# Patient Record
Sex: Male | Born: 1935 | Race: Black or African American | Hispanic: No | Marital: Married | Smoking: Former smoker
Health system: Southern US, Community
[De-identification: ages and names within clinical notes are randomized; demographics above are authoritative.]

## PROBLEM LIST (undated history)

## (undated) DIAGNOSIS — Z8739 Personal history of other diseases of the musculoskeletal system and connective tissue: Secondary | ICD-10-CM

## (undated) DIAGNOSIS — K5900 Constipation, unspecified: Secondary | ICD-10-CM

## (undated) DIAGNOSIS — E785 Hyperlipidemia, unspecified: Secondary | ICD-10-CM

## (undated) DIAGNOSIS — F419 Anxiety disorder, unspecified: Secondary | ICD-10-CM

## (undated) DIAGNOSIS — M1990 Unspecified osteoarthritis, unspecified site: Secondary | ICD-10-CM

## (undated) DIAGNOSIS — C61 Malignant neoplasm of prostate: Secondary | ICD-10-CM

## (undated) DIAGNOSIS — E119 Type 2 diabetes mellitus without complications: Secondary | ICD-10-CM

## (undated) DIAGNOSIS — I10 Essential (primary) hypertension: Secondary | ICD-10-CM

## (undated) DIAGNOSIS — K219 Gastro-esophageal reflux disease without esophagitis: Secondary | ICD-10-CM

## (undated) DIAGNOSIS — R413 Other amnesia: Principal | ICD-10-CM

## (undated) DIAGNOSIS — E0590 Thyrotoxicosis, unspecified without thyrotoxic crisis or storm: Secondary | ICD-10-CM

## (undated) DIAGNOSIS — R011 Cardiac murmur, unspecified: Secondary | ICD-10-CM

## (undated) DIAGNOSIS — I2510 Atherosclerotic heart disease of native coronary artery without angina pectoris: Secondary | ICD-10-CM

## (undated) DIAGNOSIS — I499 Cardiac arrhythmia, unspecified: Secondary | ICD-10-CM

## (undated) HISTORY — DX: Anxiety disorder, unspecified: F41.9

## (undated) HISTORY — PX: PROSTATECTOMY: SHX69

## (undated) HISTORY — PX: COLONOSCOPY: SHX174

## (undated) HISTORY — PX: CATARACT EXTRACTION W/ INTRAOCULAR LENS IMPLANT: SHX1309

## (undated) HISTORY — PX: PENILE PROSTHESIS IMPLANT: SHX240

## (undated) HISTORY — PX: COLON SURGERY: SHX602

## (undated) HISTORY — DX: Hyperlipidemia, unspecified: E78.5

## (undated) HISTORY — DX: Essential (primary) hypertension: I10

## (undated) HISTORY — DX: Atherosclerotic heart disease of native coronary artery without angina pectoris: I25.10

## (undated) HISTORY — DX: Other amnesia: R41.3

## (undated) HISTORY — PX: APPENDECTOMY: SHX54

## (undated) HISTORY — DX: Thyrotoxicosis, unspecified without thyrotoxic crisis or storm: E05.90

---

## 1997-07-11 ENCOUNTER — Ambulatory Visit (HOSPITAL_COMMUNITY): Admission: RE | Admit: 1997-07-11 | Discharge: 1997-07-11 | Payer: Self-pay | Admitting: Nephrology

## 1997-08-19 ENCOUNTER — Ambulatory Visit (HOSPITAL_COMMUNITY): Admission: RE | Admit: 1997-08-19 | Discharge: 1997-08-19 | Payer: Self-pay | Admitting: Nephrology

## 1998-04-01 ENCOUNTER — Ambulatory Visit (HOSPITAL_COMMUNITY): Admission: RE | Admit: 1998-04-01 | Discharge: 1998-04-01 | Payer: Self-pay | Admitting: Nephrology

## 1998-04-01 ENCOUNTER — Encounter: Payer: Self-pay | Admitting: Nephrology

## 1998-05-20 ENCOUNTER — Encounter: Payer: Self-pay | Admitting: Urology

## 1998-05-22 ENCOUNTER — Observation Stay (HOSPITAL_COMMUNITY): Admission: RE | Admit: 1998-05-22 | Discharge: 1998-05-23 | Payer: Self-pay | Admitting: Urology

## 2000-12-05 ENCOUNTER — Ambulatory Visit (HOSPITAL_COMMUNITY): Admission: RE | Admit: 2000-12-05 | Discharge: 2000-12-05 | Payer: Self-pay | Admitting: *Deleted

## 2000-12-12 ENCOUNTER — Emergency Department (HOSPITAL_COMMUNITY): Admission: EM | Admit: 2000-12-12 | Discharge: 2000-12-12 | Payer: Self-pay | Admitting: Emergency Medicine

## 2001-12-18 ENCOUNTER — Encounter: Admission: RE | Admit: 2001-12-18 | Discharge: 2001-12-18 | Payer: Self-pay | Admitting: Nephrology

## 2001-12-18 ENCOUNTER — Encounter: Payer: Self-pay | Admitting: Nephrology

## 2003-08-23 ENCOUNTER — Inpatient Hospital Stay (HOSPITAL_COMMUNITY): Admission: EM | Admit: 2003-08-23 | Discharge: 2003-08-25 | Payer: Self-pay | Admitting: Emergency Medicine

## 2003-08-25 HISTORY — PX: CARDIAC CATHETERIZATION: SHX172

## 2003-11-07 ENCOUNTER — Encounter: Admission: RE | Admit: 2003-11-07 | Discharge: 2003-11-07 | Payer: Self-pay | Admitting: Nephrology

## 2005-11-24 ENCOUNTER — Encounter: Admission: RE | Admit: 2005-11-24 | Discharge: 2005-11-24 | Payer: Self-pay | Admitting: Nephrology

## 2006-04-12 ENCOUNTER — Ambulatory Visit (HOSPITAL_COMMUNITY): Admission: RE | Admit: 2006-04-12 | Discharge: 2006-04-12 | Payer: Self-pay | Admitting: Nephrology

## 2009-06-18 ENCOUNTER — Encounter (HOSPITAL_COMMUNITY): Admission: RE | Admit: 2009-06-18 | Discharge: 2009-09-16 | Payer: Self-pay | Admitting: Urology

## 2010-02-24 ENCOUNTER — Ambulatory Visit: Payer: Self-pay | Admitting: Oncology

## 2010-03-09 ENCOUNTER — Ambulatory Visit (HOSPITAL_COMMUNITY)
Admission: RE | Admit: 2010-03-09 | Discharge: 2010-03-09 | Payer: Self-pay | Source: Home / Self Care | Admitting: Urology

## 2010-04-16 ENCOUNTER — Ambulatory Visit: Payer: Self-pay | Admitting: Oncology

## 2010-04-20 LAB — CBC WITH DIFFERENTIAL/PLATELET
BASO%: 0.5 % (ref 0.0–2.0)
Basophils Absolute: 0 10*3/uL (ref 0.0–0.1)
EOS%: 6.4 % (ref 0.0–7.0)
Eosinophils Absolute: 0.3 10*3/uL (ref 0.0–0.5)
HCT: 36.4 % — ABNORMAL LOW (ref 38.4–49.9)
HGB: 11.8 g/dL — ABNORMAL LOW (ref 13.0–17.1)
LYMPH%: 33.3 % (ref 14.0–49.0)
MCH: 29.8 pg (ref 27.2–33.4)
MCHC: 32.4 g/dL (ref 32.0–36.0)
MCV: 91.8 fL (ref 79.3–98.0)
MONO#: 0.8 10*3/uL (ref 0.1–0.9)
MONO%: 15.9 % — ABNORMAL HIGH (ref 0.0–14.0)
NEUT#: 2.2 10*3/uL (ref 1.5–6.5)
NEUT%: 43.9 % (ref 39.0–75.0)
Platelets: 139 10*3/uL — ABNORMAL LOW (ref 140–400)
RBC: 3.97 10*6/uL — ABNORMAL LOW (ref 4.20–5.82)
RDW: 14.4 % (ref 11.0–14.6)
WBC: 5 10*3/uL (ref 4.0–10.3)
lymph#: 1.7 10*3/uL (ref 0.9–3.3)

## 2010-04-20 LAB — COMPREHENSIVE METABOLIC PANEL
ALT: 14 U/L (ref 0–53)
AST: 22 U/L (ref 0–37)
Albumin: 4.3 g/dL (ref 3.5–5.2)
Alkaline Phosphatase: 40 U/L (ref 39–117)
BUN: 20 mg/dL (ref 6–23)
CO2: 26 mEq/L (ref 19–32)
Calcium: 9.8 mg/dL (ref 8.4–10.5)
Chloride: 105 mEq/L (ref 96–112)
Creatinine, Ser: 1.97 mg/dL — ABNORMAL HIGH (ref 0.40–1.50)
Glucose, Bld: 66 mg/dL — ABNORMAL LOW (ref 70–99)
Potassium: 3.9 mEq/L (ref 3.5–5.3)
Sodium: 142 mEq/L (ref 135–145)
Total Bilirubin: 0.7 mg/dL (ref 0.3–1.2)
Total Protein: 7.1 g/dL (ref 6.0–8.3)

## 2010-04-20 LAB — TESTOSTERONE: Testosterone: 15.41 ng/dL — ABNORMAL LOW (ref 250–890)

## 2010-04-20 LAB — PSA: PSA: 23.81 ng/mL — ABNORMAL HIGH (ref <=4.00)

## 2010-07-06 ENCOUNTER — Other Ambulatory Visit: Payer: Self-pay | Admitting: Oncology

## 2010-07-06 ENCOUNTER — Encounter: Payer: Medicare Other | Admitting: Oncology

## 2010-07-06 DIAGNOSIS — E785 Hyperlipidemia, unspecified: Secondary | ICD-10-CM

## 2010-07-06 DIAGNOSIS — I10 Essential (primary) hypertension: Secondary | ICD-10-CM

## 2010-07-06 DIAGNOSIS — E119 Type 2 diabetes mellitus without complications: Secondary | ICD-10-CM

## 2010-07-06 DIAGNOSIS — C61 Malignant neoplasm of prostate: Secondary | ICD-10-CM

## 2010-07-06 LAB — COMPREHENSIVE METABOLIC PANEL
AST: 19 U/L (ref 0–37)
Albumin: 4.3 g/dL (ref 3.5–5.2)
Alkaline Phosphatase: 47 U/L (ref 39–117)
Calcium: 9.4 mg/dL (ref 8.4–10.5)
Chloride: 101 mEq/L (ref 96–112)
Potassium: 3.7 mEq/L (ref 3.5–5.3)
Sodium: 141 mEq/L (ref 135–145)
Total Protein: 7.7 g/dL (ref 6.0–8.3)

## 2010-07-06 LAB — CBC WITH DIFFERENTIAL/PLATELET
BASO%: 0.1 % (ref 0.0–2.0)
EOS%: 3.1 % (ref 0.0–7.0)
Eosinophils Absolute: 0.2 10*3/uL (ref 0.0–0.5)
LYMPH%: 51.3 % — ABNORMAL HIGH (ref 14.0–49.0)
MCH: 28.9 pg (ref 27.2–33.4)
MCHC: 33.2 g/dL (ref 32.0–36.0)
MCV: 87 fL (ref 79.3–98.0)
MONO%: 8.2 % (ref 0.0–14.0)
Platelets: 147 10*3/uL (ref 140–400)
RBC: 4.46 10*6/uL (ref 4.20–5.82)
RDW: 13.6 % (ref 11.0–14.6)

## 2010-07-08 ENCOUNTER — Encounter (HOSPITAL_BASED_OUTPATIENT_CLINIC_OR_DEPARTMENT_OTHER): Payer: Medicare Other | Admitting: Oncology

## 2010-07-08 DIAGNOSIS — Z5111 Encounter for antineoplastic chemotherapy: Secondary | ICD-10-CM

## 2010-07-08 DIAGNOSIS — C61 Malignant neoplasm of prostate: Secondary | ICD-10-CM

## 2010-08-27 NOTE — Cardiovascular Report (Signed)
NAME:  Shane [name], Shane [name] NO.:  [id number]   MEDICAL RECORD NO.:  [id number]                   PATIENT TYPE:  OUT   LOCATION:  CATH                                 FACILITY:  [facility]   PHYSICIAN:  Ricki Rodriguez, M.D.               DATE OF BIRTH:  Sep 23, 1926   DATE OF PROCEDURE:  08/25/2003  DATE OF DISCHARGE:                              CARDIAC CATHETERIZATION   PROCEDURE:  1. Left heart catheterization.  2. Selective coronary angiography.  3. Left ventricular function study.   APPROACH:  Right femoral artery using 5 French sheath.   COMPLICATIONS:  None.   Less than 50 mL of dye was used.   INDICATION:  A 78 year old black male with chest pain, abnormal cardiac  enzymes, abnormal EKG and history of hypertension and hyperlipidemia.   HEMODYNAMIC DATA:  The left ventricular pressure was 164/13 and aortic  pressure was 180/85.  One squirt of sublingual nitroglycerin was given for  blood pressure control.   LEFT VENTRICULOGRAM:  The left ventriculogram showed hyperdynamic wall  motion with ejection fraction of 70-75%.   CORONARY ANATOMY:  The left main coronary artery was unremarkable.   Left anterior descending coronary artery:  The left anterior descending  coronary artery was also unremarkable and its diagonal two branch was larger  than the diagonal one.   Left circumflex coronary artery:  The left circumflex coronary artery was  also essentially unremarkable.  Its obtuse marginal branch was normal and  obtuse marginal branch one and two were very small vessels and obtuse  marginal branch three was normal vessel.   Right coronary artery:  The right coronary artery was dominant and  unremarkable including the posterior descending coronary arteries  and the  posterolateral branch.   IMPRESSION:  1. Normal coronaries.  2. Hypertensive heart disease with normal left ventricular systolic     function.   RECOMMENDATIONS:  This patient will  be treated medically and Dr. Jeri Cos was notified.                                               Ricki Rodriguez, M.D.    ASK/MEDQ  D:  08/25/2003  T:  08/25/2003  Job:  [id number]

## 2010-08-31 ENCOUNTER — Other Ambulatory Visit: Payer: Self-pay | Admitting: Oncology

## 2010-08-31 ENCOUNTER — Encounter: Payer: Medicare Other | Admitting: Oncology

## 2010-08-31 LAB — COMPREHENSIVE METABOLIC PANEL
ALT: 11 U/L (ref 0–53)
Alkaline Phosphatase: 45 U/L (ref 39–117)
CO2: 24 mEq/L (ref 19–32)
Potassium: 3.8 mEq/L (ref 3.5–5.3)
Sodium: 140 mEq/L (ref 135–145)
Total Bilirubin: 0.7 mg/dL (ref 0.3–1.2)
Total Protein: 6.5 g/dL (ref 6.0–8.3)

## 2010-08-31 LAB — CBC WITH DIFFERENTIAL/PLATELET
BASO%: 0.5 % (ref 0.0–2.0)
LYMPH%: 36 % (ref 14.0–49.0)
MCHC: 32.8 g/dL (ref 32.0–36.0)
MONO#: 0.5 10*3/uL (ref 0.1–0.9)
Platelets: 122 10*3/uL — ABNORMAL LOW (ref 140–400)
RBC: 4.02 10*6/uL — ABNORMAL LOW (ref 4.20–5.82)
RDW: 14.3 % (ref 11.0–14.6)
WBC: 3.7 10*3/uL — ABNORMAL LOW (ref 4.0–10.3)

## 2010-08-31 LAB — PSA: PSA: 47.49 ng/mL — ABNORMAL HIGH (ref <=4.00)

## 2010-09-07 ENCOUNTER — Other Ambulatory Visit: Payer: Self-pay | Admitting: Oncology

## 2010-09-07 ENCOUNTER — Encounter (HOSPITAL_BASED_OUTPATIENT_CLINIC_OR_DEPARTMENT_OTHER): Payer: Medicare Other | Admitting: Oncology

## 2010-09-07 DIAGNOSIS — C61 Malignant neoplasm of prostate: Secondary | ICD-10-CM

## 2010-09-22 ENCOUNTER — Encounter (HOSPITAL_COMMUNITY): Payer: Self-pay

## 2010-09-22 ENCOUNTER — Encounter (HOSPITAL_COMMUNITY)
Admission: RE | Admit: 2010-09-22 | Discharge: 2010-09-22 | Disposition: A | Discharge status: Home / Self Care | Payer: Medicare Other | Source: Ambulatory Visit | Attending: Oncology | Admitting: Oncology

## 2010-09-22 ENCOUNTER — Ambulatory Visit (HOSPITAL_COMMUNITY): Payer: Medicare Other

## 2010-09-22 DIAGNOSIS — N200 Calculus of kidney: Secondary | ICD-10-CM | POA: Insufficient documentation

## 2010-09-22 DIAGNOSIS — R972 Elevated prostate specific antigen [PSA]: Secondary | ICD-10-CM | POA: Insufficient documentation

## 2010-09-22 DIAGNOSIS — Z9079 Acquired absence of other genital organ(s): Secondary | ICD-10-CM | POA: Insufficient documentation

## 2010-09-22 DIAGNOSIS — K4090 Unilateral inguinal hernia, without obstruction or gangrene, not specified as recurrent: Secondary | ICD-10-CM | POA: Insufficient documentation

## 2010-09-22 DIAGNOSIS — E279 Disorder of adrenal gland, unspecified: Secondary | ICD-10-CM | POA: Insufficient documentation

## 2010-09-22 DIAGNOSIS — C61 Malignant neoplasm of prostate: Secondary | ICD-10-CM | POA: Insufficient documentation

## 2010-09-22 DIAGNOSIS — I700 Atherosclerosis of aorta: Secondary | ICD-10-CM | POA: Insufficient documentation

## 2010-09-22 DIAGNOSIS — R599 Enlarged lymph nodes, unspecified: Secondary | ICD-10-CM | POA: Insufficient documentation

## 2010-09-22 DIAGNOSIS — I359 Nonrheumatic aortic valve disorder, unspecified: Secondary | ICD-10-CM | POA: Insufficient documentation

## 2010-09-22 HISTORY — DX: Malignant neoplasm of prostate: C61

## 2010-09-22 MED ORDER — IOHEXOL 300 MG/ML  SOLN
100.0000 mL | Freq: Once | INTRAMUSCULAR | Status: AC | PRN
  Administered 2010-09-22: 100 mL via INTRAVENOUS

## 2010-09-22 MED ORDER — TECHNETIUM TC 99M MEDRONATE IV KIT
23.9000 | PACK | Freq: Once | INTRAVENOUS | Status: AC | PRN
  Administered 2010-09-22: 23.9 via INTRAVENOUS

## 2010-09-28 ENCOUNTER — Encounter (HOSPITAL_BASED_OUTPATIENT_CLINIC_OR_DEPARTMENT_OTHER): Payer: Medicare Other | Admitting: Oncology

## 2010-09-28 DIAGNOSIS — C61 Malignant neoplasm of prostate: Secondary | ICD-10-CM

## 2010-10-28 ENCOUNTER — Other Ambulatory Visit: Payer: Self-pay | Admitting: Oncology

## 2010-10-28 ENCOUNTER — Encounter (HOSPITAL_BASED_OUTPATIENT_CLINIC_OR_DEPARTMENT_OTHER): Payer: Medicare Other | Admitting: Oncology

## 2010-10-28 DIAGNOSIS — C61 Malignant neoplasm of prostate: Secondary | ICD-10-CM

## 2010-10-28 LAB — CBC WITH DIFFERENTIAL/PLATELET
Basophils Absolute: 0 10*3/uL (ref 0.0–0.1)
EOS%: 4.9 % (ref 0.0–7.0)
HCT: 39 % (ref 38.4–49.9)
HGB: 12.8 g/dL — ABNORMAL LOW (ref 13.0–17.1)
MCH: 29.6 pg (ref 27.2–33.4)
MCV: 90.1 fL (ref 79.3–98.0)
MONO%: 11.5 % (ref 0.0–14.0)
NEUT%: 42.1 % (ref 39.0–75.0)

## 2010-10-28 LAB — COMPREHENSIVE METABOLIC PANEL
AST: 17 U/L (ref 0–37)
Alkaline Phosphatase: 44 U/L (ref 39–117)
BUN: 20 mg/dL (ref 6–23)
Creatinine, Ser: 1.52 mg/dL — ABNORMAL HIGH (ref 0.50–1.35)

## 2010-11-02 ENCOUNTER — Other Ambulatory Visit: Payer: Self-pay | Admitting: Oncology

## 2010-11-02 DIAGNOSIS — C61 Malignant neoplasm of prostate: Secondary | ICD-10-CM

## 2010-11-08 ENCOUNTER — Ambulatory Visit (HOSPITAL_COMMUNITY)
Admission: RE | Admit: 2010-11-08 | Discharge: 2010-11-08 | Disposition: A | Discharge status: Home / Self Care | Payer: Medicare Other | Source: Ambulatory Visit | Attending: Oncology | Admitting: Oncology

## 2010-11-08 ENCOUNTER — Other Ambulatory Visit: Payer: Self-pay | Admitting: Oncology

## 2010-11-08 DIAGNOSIS — C7952 Secondary malignant neoplasm of bone marrow: Secondary | ICD-10-CM | POA: Insufficient documentation

## 2010-11-08 DIAGNOSIS — C61 Malignant neoplasm of prostate: Secondary | ICD-10-CM

## 2010-11-08 DIAGNOSIS — C7951 Secondary malignant neoplasm of bone: Secondary | ICD-10-CM | POA: Insufficient documentation

## 2010-11-19 ENCOUNTER — Encounter (HOSPITAL_BASED_OUTPATIENT_CLINIC_OR_DEPARTMENT_OTHER): Payer: Medicare Other | Admitting: Oncology

## 2010-11-19 ENCOUNTER — Other Ambulatory Visit: Payer: Self-pay | Admitting: Oncology

## 2010-11-19 DIAGNOSIS — C7951 Secondary malignant neoplasm of bone: Secondary | ICD-10-CM

## 2010-11-19 DIAGNOSIS — C61 Malignant neoplasm of prostate: Secondary | ICD-10-CM

## 2010-11-19 DIAGNOSIS — Z5112 Encounter for antineoplastic immunotherapy: Secondary | ICD-10-CM

## 2010-11-19 LAB — CBC WITH DIFFERENTIAL/PLATELET
Basophils Absolute: 0 10*3/uL (ref 0.0–0.1)
EOS%: 7.5 % — ABNORMAL HIGH (ref 0.0–7.0)
HCT: 37.5 % — ABNORMAL LOW (ref 38.4–49.9)
HGB: 12.1 g/dL — ABNORMAL LOW (ref 13.0–17.1)
MCH: 28.6 pg (ref 27.2–33.4)
MCV: 88.7 fL (ref 79.3–98.0)
NEUT%: 41.3 % (ref 39.0–75.0)
lymph#: 1.8 10*3/uL (ref 0.9–3.3)

## 2010-11-19 LAB — COMPREHENSIVE METABOLIC PANEL
Albumin: 3.9 g/dL (ref 3.5–5.2)
BUN: 20 mg/dL (ref 6–23)
CO2: 24 mEq/L (ref 19–32)
Calcium: 9.2 mg/dL (ref 8.4–10.5)
Chloride: 104 mEq/L (ref 96–112)
Glucose, Bld: 98 mg/dL (ref 70–99)
Potassium: 3.6 mEq/L (ref 3.5–5.3)
Total Protein: 6.5 g/dL (ref 6.0–8.3)

## 2010-11-23 ENCOUNTER — Encounter (HOSPITAL_BASED_OUTPATIENT_CLINIC_OR_DEPARTMENT_OTHER): Payer: Medicare Other | Admitting: Oncology

## 2010-11-23 DIAGNOSIS — C61 Malignant neoplasm of prostate: Secondary | ICD-10-CM

## 2010-11-23 DIAGNOSIS — Z452 Encounter for adjustment and management of vascular access device: Secondary | ICD-10-CM

## 2010-11-25 ENCOUNTER — Encounter (HOSPITAL_BASED_OUTPATIENT_CLINIC_OR_DEPARTMENT_OTHER): Payer: Medicare Other | Admitting: Oncology

## 2010-11-25 DIAGNOSIS — C61 Malignant neoplasm of prostate: Secondary | ICD-10-CM

## 2010-11-25 DIAGNOSIS — Z452 Encounter for adjustment and management of vascular access device: Secondary | ICD-10-CM

## 2010-11-25 DIAGNOSIS — C7951 Secondary malignant neoplasm of bone: Secondary | ICD-10-CM

## 2010-12-03 ENCOUNTER — Other Ambulatory Visit: Payer: Self-pay | Admitting: Oncology

## 2010-12-03 ENCOUNTER — Encounter (HOSPITAL_BASED_OUTPATIENT_CLINIC_OR_DEPARTMENT_OTHER): Payer: Medicare Other | Admitting: Oncology

## 2010-12-03 DIAGNOSIS — Z5111 Encounter for antineoplastic chemotherapy: Secondary | ICD-10-CM

## 2010-12-03 DIAGNOSIS — C7951 Secondary malignant neoplasm of bone: Secondary | ICD-10-CM

## 2010-12-03 DIAGNOSIS — C61 Malignant neoplasm of prostate: Secondary | ICD-10-CM

## 2010-12-03 DIAGNOSIS — C7952 Secondary malignant neoplasm of bone marrow: Secondary | ICD-10-CM

## 2010-12-03 LAB — COMPREHENSIVE METABOLIC PANEL
ALT: 15 U/L (ref 0–53)
Albumin: 4.2 g/dL (ref 3.5–5.2)
Alkaline Phosphatase: 46 U/L (ref 39–117)
CO2: 26 mEq/L (ref 19–32)
Glucose, Bld: 144 mg/dL — ABNORMAL HIGH (ref 70–99)
Potassium: 4 mEq/L (ref 3.5–5.3)
Sodium: 140 mEq/L (ref 135–145)
Total Bilirubin: 0.7 mg/dL (ref 0.3–1.2)
Total Protein: 7.1 g/dL (ref 6.0–8.3)

## 2010-12-03 LAB — CBC WITH DIFFERENTIAL/PLATELET
Basophils Absolute: 0 10*3/uL (ref 0.0–0.1)
EOS%: 9.7 % — ABNORMAL HIGH (ref 0.0–7.0)
Eosinophils Absolute: 0.4 10*3/uL (ref 0.0–0.5)
HGB: 12.1 g/dL — ABNORMAL LOW (ref 13.0–17.1)
NEUT#: 1.6 10*3/uL (ref 1.5–6.5)
RBC: 4.2 10*6/uL (ref 4.20–5.82)
RDW: 13.9 % (ref 11.0–14.6)
lymph#: 1.3 10*3/uL (ref 0.9–3.3)
nRBC: 0 % (ref 0–0)

## 2010-12-07 ENCOUNTER — Encounter (HOSPITAL_BASED_OUTPATIENT_CLINIC_OR_DEPARTMENT_OTHER): Payer: Medicare Other | Admitting: Oncology

## 2010-12-07 DIAGNOSIS — C7952 Secondary malignant neoplasm of bone marrow: Secondary | ICD-10-CM

## 2010-12-07 DIAGNOSIS — C61 Malignant neoplasm of prostate: Secondary | ICD-10-CM

## 2010-12-07 DIAGNOSIS — C7951 Secondary malignant neoplasm of bone: Secondary | ICD-10-CM

## 2010-12-07 DIAGNOSIS — Z452 Encounter for adjustment and management of vascular access device: Secondary | ICD-10-CM

## 2010-12-09 ENCOUNTER — Encounter (HOSPITAL_BASED_OUTPATIENT_CLINIC_OR_DEPARTMENT_OTHER): Payer: Medicare Other | Admitting: Oncology

## 2010-12-09 DIAGNOSIS — C61 Malignant neoplasm of prostate: Secondary | ICD-10-CM

## 2010-12-09 DIAGNOSIS — Z452 Encounter for adjustment and management of vascular access device: Secondary | ICD-10-CM

## 2010-12-17 ENCOUNTER — Encounter (HOSPITAL_BASED_OUTPATIENT_CLINIC_OR_DEPARTMENT_OTHER): Payer: Medicare Other | Admitting: Oncology

## 2010-12-17 ENCOUNTER — Other Ambulatory Visit: Payer: Self-pay | Admitting: Oncology

## 2010-12-17 DIAGNOSIS — C61 Malignant neoplasm of prostate: Secondary | ICD-10-CM

## 2010-12-17 DIAGNOSIS — C7952 Secondary malignant neoplasm of bone marrow: Secondary | ICD-10-CM

## 2010-12-17 LAB — CBC WITH DIFFERENTIAL/PLATELET
BASO%: 1 % (ref 0.0–2.0)
EOS%: 7.1 % — ABNORMAL HIGH (ref 0.0–7.0)
Eosinophils Absolute: 0.3 10*3/uL (ref 0.0–0.5)
LYMPH%: 34.3 % (ref 14.0–49.0)
MCHC: 32.9 g/dL (ref 32.0–36.0)
MCV: 86.4 fL (ref 79.3–98.0)
MONO%: 11 % (ref 0.0–14.0)
NEUT#: 2 10*3/uL (ref 1.5–6.5)
Platelets: 91 10*3/uL — ABNORMAL LOW (ref 140–400)
RBC: 4.05 10*6/uL — ABNORMAL LOW (ref 4.20–5.82)
RDW: 13.8 % (ref 11.0–14.6)
nRBC: 0 % (ref 0–0)

## 2010-12-17 LAB — COMPREHENSIVE METABOLIC PANEL
ALT: 14 U/L (ref 0–53)
AST: 19 U/L (ref 0–37)
Alkaline Phosphatase: 46 U/L (ref 39–117)
Creatinine, Ser: 1.32 mg/dL (ref 0.50–1.35)
Sodium: 141 mEq/L (ref 135–145)
Total Bilirubin: 0.6 mg/dL (ref 0.3–1.2)
Total Protein: 6.8 g/dL (ref 6.0–8.3)

## 2010-12-20 ENCOUNTER — Other Ambulatory Visit: Payer: Self-pay | Admitting: Oncology

## 2010-12-20 DIAGNOSIS — C61 Malignant neoplasm of prostate: Secondary | ICD-10-CM

## 2010-12-24 ENCOUNTER — Ambulatory Visit (HOSPITAL_COMMUNITY)
Admission: RE | Admit: 2010-12-24 | Discharge: 2010-12-24 | Disposition: A | Discharge status: Home / Self Care | Payer: Medicare Other | Source: Ambulatory Visit | Attending: Oncology | Admitting: Oncology

## 2010-12-24 DIAGNOSIS — Z452 Encounter for adjustment and management of vascular access device: Secondary | ICD-10-CM | POA: Insufficient documentation

## 2010-12-24 DIAGNOSIS — C61 Malignant neoplasm of prostate: Secondary | ICD-10-CM | POA: Insufficient documentation

## 2011-01-26 ENCOUNTER — Other Ambulatory Visit: Payer: Self-pay | Admitting: Oncology

## 2011-01-26 ENCOUNTER — Encounter (HOSPITAL_BASED_OUTPATIENT_CLINIC_OR_DEPARTMENT_OTHER): Payer: Medicare Other | Admitting: Oncology

## 2011-01-26 DIAGNOSIS — C61 Malignant neoplasm of prostate: Secondary | ICD-10-CM

## 2011-01-26 DIAGNOSIS — Z9079 Acquired absence of other genital organ(s): Secondary | ICD-10-CM

## 2011-01-26 DIAGNOSIS — Z8546 Personal history of malignant neoplasm of prostate: Secondary | ICD-10-CM

## 2011-01-26 DIAGNOSIS — Z923 Personal history of irradiation: Secondary | ICD-10-CM

## 2011-01-26 LAB — COMPREHENSIVE METABOLIC PANEL
ALT: 10 U/L (ref 0–53)
AST: 16 U/L (ref 0–37)
Albumin: 3.5 g/dL (ref 3.5–5.2)
Alkaline Phosphatase: 51 U/L (ref 39–117)
BUN: 19 mg/dL (ref 6–23)
Calcium: 9.3 mg/dL (ref 8.4–10.5)
Chloride: 104 mEq/L (ref 96–112)
Potassium: 3.9 mEq/L (ref 3.5–5.3)
Sodium: 138 mEq/L (ref 135–145)

## 2011-01-26 LAB — CBC WITH DIFFERENTIAL/PLATELET
BASO%: 0.6 % (ref 0.0–2.0)
EOS%: 4.3 % (ref 0.0–7.0)
HGB: 11.4 g/dL — ABNORMAL LOW (ref 13.0–17.1)
MCH: 28.5 pg (ref 27.2–33.4)
MCHC: 32.9 g/dL (ref 32.0–36.0)
MCV: 86.8 fL (ref 79.3–98.0)
MONO%: 12.7 % (ref 0.0–14.0)
RBC: 4 10*6/uL — ABNORMAL LOW (ref 4.20–5.82)
RDW: 13.7 % (ref 11.0–14.6)
lymph#: 1.4 10*3/uL (ref 0.9–3.3)

## 2011-04-01 ENCOUNTER — Ambulatory Visit
Admission: RE | Admit: 2011-04-01 | Discharge: 2011-04-01 | Disposition: A | Discharge status: Home / Self Care | Payer: Medicare Other | Source: Ambulatory Visit | Attending: Nephrology | Admitting: Nephrology

## 2011-04-01 ENCOUNTER — Other Ambulatory Visit: Payer: Self-pay | Admitting: Nephrology

## 2011-04-01 DIAGNOSIS — I10 Essential (primary) hypertension: Secondary | ICD-10-CM

## 2011-04-10 ENCOUNTER — Telehealth: Payer: Self-pay | Admitting: Oncology

## 2011-04-10 NOTE — Telephone Encounter (Signed)
S/w the pt and he is aware of his feb 2013 appts

## 2011-05-09 ENCOUNTER — Encounter: Payer: Self-pay | Admitting: *Deleted

## 2011-05-27 ENCOUNTER — Encounter: Payer: Self-pay | Admitting: *Deleted

## 2011-06-01 ENCOUNTER — Ambulatory Visit (HOSPITAL_BASED_OUTPATIENT_CLINIC_OR_DEPARTMENT_OTHER): Payer: PRIVATE HEALTH INSURANCE | Admitting: Oncology

## 2011-06-01 ENCOUNTER — Telehealth: Payer: Self-pay | Admitting: Oncology

## 2011-06-01 ENCOUNTER — Other Ambulatory Visit: Payer: PRIVATE HEALTH INSURANCE | Admitting: Lab

## 2011-06-01 VITALS — BP 145/72 | HR 60 | Temp 97.2°F | Ht 66.5 in | Wt 172.8 lb

## 2011-06-01 DIAGNOSIS — C7951 Secondary malignant neoplasm of bone: Secondary | ICD-10-CM

## 2011-06-01 DIAGNOSIS — C61 Malignant neoplasm of prostate: Secondary | ICD-10-CM

## 2011-06-01 DIAGNOSIS — C7952 Secondary malignant neoplasm of bone marrow: Secondary | ICD-10-CM

## 2011-06-01 LAB — COMPREHENSIVE METABOLIC PANEL
ALT: 10 U/L (ref 0–53)
AST: 18 U/L (ref 0–37)
Albumin: 4.2 g/dL (ref 3.5–5.2)
CO2: 26 mEq/L (ref 19–32)
Calcium: 9.6 mg/dL (ref 8.4–10.5)
Chloride: 103 mEq/L (ref 96–112)
Creatinine, Ser: 1.49 mg/dL — ABNORMAL HIGH (ref 0.50–1.35)
Potassium: 3.9 mEq/L (ref 3.5–5.3)
Sodium: 138 mEq/L (ref 135–145)
Total Protein: 7 g/dL (ref 6.0–8.3)

## 2011-06-01 LAB — CBC WITH DIFFERENTIAL/PLATELET
BASO%: 0.5 % (ref 0.0–2.0)
EOS%: 4 % (ref 0.0–7.0)
LYMPH%: 36.1 % (ref 14.0–49.0)
MCHC: 32.3 g/dL (ref 32.0–36.0)
MCV: 90 fL (ref 79.3–98.0)
MONO%: 10.8 % (ref 0.0–14.0)
NEUT#: 1.8 10*3/uL (ref 1.5–6.5)
Platelets: 131 10*3/uL — ABNORMAL LOW (ref 140–400)
RBC: 4.51 10*6/uL (ref 4.20–5.82)
RDW: 14.3 % (ref 11.0–14.6)

## 2011-06-01 LAB — PSA: PSA: 166.5 ng/mL — ABNORMAL HIGH (ref <=4.00)

## 2011-06-01 NOTE — Progress Notes (Signed)
Hematology and Oncology Follow Up Visit  Shane [name] [id number] 1926/04/24 78 y.o. 06/01/2011 10:44 AM  CC: Shane [name], M.D.  Shane [name], M.D.    Principle Diagnosis:This is an 78 year old gentleman with prostate cancer diagnosed in 36.  He currently has metastatic disease that is asymptomatic.   Prior Therapy: 1. Status post prostatectomy and lymphadenectomy.  He had involvement of the seminal vesicles. 2. Patient treated with adjuvant radiation therapy. 3. Patient developed recurrent disease treated with Lupron and subsequently with Lupron and Casodex and Casodex withdrawal. 4. Patient treated with second-line hormonal manipulation, including ketoconazole, prednisone and subsequently had relapse with PSA up to 47. 5. Received Provenge immunotherapy.  He had treated with 3 Provenge infusions, the last of which was given on 12/17/2010.  H  Current therapy: Currently on hormonal deprivation 30 mg every 4 months-6 months.  Interim History:  Shane [name] presents today for a followup visit.  He has continued to be asymptomatic from his disease.  He is not reporting any symptoms of chest pain or difficulty breathing.  He had not reported any back pain.  Did not report any genitourinary complaints.  Performance status and activity level remain excellent at this time.  Fortunately, he has continued to be minimally symptomatic or asymptomatic from his cancer. No recent hospitalizations or illnesses.   Medications: I have reviewed the patient's current medications. Current outpatient prescriptions:amLODipine (NORVASC) 5 MG tablet, Take 5 mg by mouth daily., Disp: , Rfl: ;  aspirin 81 MG tablet, Take 81 mg by mouth daily., Disp: , Rfl: ;  atorvastatin (LIPITOR) 20 MG tablet, Take 20 mg by mouth daily., Disp: , Rfl: ;  cholecalciferol (VITAMIN D) 400 UNITS TABS, Take 400 Units by mouth daily., Disp: , Rfl:  gemfibrozil (LOPID) 600 MG tablet, Take 600 mg by mouth 2 (two) times daily  before a meal., Disp: , Rfl: ;  glipiZIDE (GLUCOTROL) 5 MG tablet, Take 5 mg by mouth 2 (two) times daily before a meal., Disp: , Rfl: ;  GuanFACINE HCl (TENEX PO), Take by mouth., Disp: , Rfl: ;  losartan-hydrochlorothiazide (HYZAAR) 100-25 MG per tablet, Take 1 tablet by mouth daily., Disp: , Rfl:  metoCLOPramide (REGLAN) 10 MG tablet, Take 10 mg by mouth 4 (four) times daily., Disp: , Rfl: ;  omeprazole (PRILOSEC) 20 MG capsule, Take 20 mg by mouth daily., Disp: , Rfl: ;  sulindac (CLINORIL) 200 MG tablet, Take 200 mg by mouth 2 (two) times daily., Disp: , Rfl: ;  vitamin C (ASCORBIC ACID) 500 MG tablet, Take 500 mg by mouth daily., Disp: , Rfl:   Allergies: No Known Allergies  Past Medical History, Surgical history, Social history, and Family History were reviewed and updated.  Review of Systems: Constitutional:  Negative for fever, chills, night sweats, anorexia, weight loss, pain. Cardiovascular: no chest pain or dyspnea on exertion Respiratory: no cough, shortness of breath, or wheezing Neurological: no TIA or stroke symptoms Dermatological: negative ENT: negative Skin: Negative. Gastrointestinal: no abdominal pain, change in bowel habits, or black or bloody stools Genito-Urinary: no dysuria, trouble voiding, or hematuria Hematological and Lymphatic: negative Breast: negative Musculoskeletal: negative Remaining ROS negative. Physical Exam: Blood pressure 145/72, pulse 60, temperature 97.2 F (36.2 C), temperature source Oral, height 5' 6.5" (1.689 m), weight 172 lb 12.8 oz (78.382 kg). ECOG: 1 General appearance: alert Head: Normocephalic, without obvious abnormality, atraumatic Neck: no adenopathy, no carotid bruit, no JVD, supple, symmetrical, trachea midline and thyroid not enlarged, symmetric, no tenderness/mass/nodules Lymph nodes: Cervical,  supraclavicular, and axillary nodes normal. Heart:regular rate and rhythm, S1, S2 normal, no murmur, click, rub or gallop Lung:chest  clear, no wheezing, rales, normal symmetric air entry Abdomin: soft, non-tender, without masses or organomegaly EXT:no erythema, induration, or nodules   Lab Results: Lab Results  Component Value Date   WBC 3.8* 06/01/2011   HGB 13.1 06/01/2011   HCT 40.6 06/01/2011   MCV 90.0 06/01/2011   PLT 131* 06/01/2011     Chemistry      Component Value Date/Time   NA 138 01/26/2011 1503   NA 138 01/26/2011 1503   K 3.9 01/26/2011 1503   K 3.9 01/26/2011 1503   CL 104 01/26/2011 1503   CL 104 01/26/2011 1503   CO2 27 01/26/2011 1503   CO2 27 01/26/2011 1503   BUN 19 01/26/2011 1503   BUN 19 01/26/2011 1503   CREATININE 1.41* 01/26/2011 1503   CREATININE 1.41* 01/26/2011 1503      Component Value Date/Time   CALCIUM 9.3 01/26/2011 1503   CALCIUM 9.3 01/26/2011 1503   ALKPHOS 51 01/26/2011 1503   ALKPHOS 51 01/26/2011 1503   AST 16 01/26/2011 1503   AST 16 01/26/2011 1503   ALT 10 01/26/2011 1503   ALT 10 01/26/2011 1503   BILITOT 0.4 01/26/2011 1503   BILITOT 0.4 01/26/2011 1503       Impression and Plan: This is a pleasant 78 year old gentleman with the following issues: 1. Castration-resistant prostate cancer.  He is status post Provenge immunotherapy.  At this time, he is on current close observation.  I will monitor his PSA, I will restage him with CT scan and a bone scan, and certainly we can consider salvage therapy such as Zytiga given his rise of PSA on recent testing. 2. Pheresis catheter management.  That had been removed successfully. 3. Androgen deprivations: I will check his testosterone level and give him Lupron as needed.   [facility], MD 2/20/201310:44 AM

## 2011-06-01 NOTE — Telephone Encounter (Signed)
appts made and printed for 3/27 and 4/3   aom

## 2011-07-06 ENCOUNTER — Other Ambulatory Visit (HOSPITAL_BASED_OUTPATIENT_CLINIC_OR_DEPARTMENT_OTHER): Payer: Medicare Other | Admitting: Lab

## 2011-07-06 ENCOUNTER — Ambulatory Visit (HOSPITAL_COMMUNITY)
Admission: RE | Admit: 2011-07-06 | Discharge: 2011-07-06 | Disposition: A | Discharge status: Home / Self Care | Payer: Medicare Other | Source: Ambulatory Visit | Attending: Oncology | Admitting: Oncology

## 2011-07-06 ENCOUNTER — Encounter (HOSPITAL_COMMUNITY): Payer: Self-pay

## 2011-07-06 ENCOUNTER — Encounter (HOSPITAL_COMMUNITY)
Admission: RE | Admit: 2011-07-06 | Discharge: 2011-07-06 | Disposition: A | Discharge status: Home / Self Care | Payer: Medicare Other | Source: Ambulatory Visit | Attending: Oncology | Admitting: Oncology

## 2011-07-06 DIAGNOSIS — C61 Malignant neoplasm of prostate: Secondary | ICD-10-CM | POA: Insufficient documentation

## 2011-07-06 DIAGNOSIS — R599 Enlarged lymph nodes, unspecified: Secondary | ICD-10-CM | POA: Insufficient documentation

## 2011-07-06 DIAGNOSIS — R972 Elevated prostate specific antigen [PSA]: Secondary | ICD-10-CM | POA: Insufficient documentation

## 2011-07-06 DIAGNOSIS — K4090 Unilateral inguinal hernia, without obstruction or gangrene, not specified as recurrent: Secondary | ICD-10-CM | POA: Insufficient documentation

## 2011-07-06 LAB — CBC WITH DIFFERENTIAL/PLATELET
BASO%: 0.2 % (ref 0.0–2.0)
Basophils Absolute: 0 10*3/uL (ref 0.0–0.1)
EOS%: 6.3 % (ref 0.0–7.0)
MCH: 28.7 pg (ref 27.2–33.4)
MCHC: 32.6 g/dL (ref 32.0–36.0)
MCV: 88.1 fL (ref 79.3–98.0)
MONO%: 10 % (ref 0.0–14.0)
RBC: 4.21 10*6/uL (ref 4.20–5.82)
RDW: 14.4 % (ref 11.0–14.6)
lymph#: 1.8 10*3/uL (ref 0.9–3.3)

## 2011-07-06 LAB — TESTOSTERONE: Testosterone: 15.26 ng/dL — ABNORMAL LOW (ref 250–890)

## 2011-07-06 LAB — CMP (CANCER CENTER ONLY)
AST: 22 U/L (ref 11–38)
Albumin: 3.5 g/dL (ref 3.3–5.5)
Alkaline Phosphatase: 56 U/L (ref 26–84)
BUN, Bld: 18 mg/dL (ref 7–22)
Potassium: 3.6 mEq/L (ref 3.3–4.7)
Sodium: 141 mEq/L (ref 128–145)
Total Bilirubin: 0.8 mg/dl (ref 0.20–1.60)
Total Protein: 7.5 g/dL (ref 6.4–8.1)

## 2011-07-06 LAB — PSA: PSA: 171.7 ng/mL — ABNORMAL HIGH (ref <=4.00)

## 2011-07-06 MED ORDER — TECHNETIUM TC 99M MEDRONATE IV KIT
25.3000 | PACK | Freq: Once | INTRAVENOUS | Status: AC | PRN
  Administered 2011-07-06: 25.3 via INTRAVENOUS

## 2011-07-06 MED ORDER — IOHEXOL 300 MG/ML  SOLN
100.0000 mL | Freq: Once | INTRAMUSCULAR | Status: AC | PRN
  Administered 2011-07-06: 100 mL via INTRAVENOUS

## 2011-07-13 ENCOUNTER — Ambulatory Visit (HOSPITAL_BASED_OUTPATIENT_CLINIC_OR_DEPARTMENT_OTHER): Payer: Medicare Other | Admitting: Oncology

## 2011-07-13 ENCOUNTER — Encounter: Payer: Self-pay | Admitting: Oncology

## 2011-07-13 ENCOUNTER — Telehealth: Payer: Self-pay | Admitting: Oncology

## 2011-07-13 ENCOUNTER — Encounter: Payer: Self-pay | Admitting: *Deleted

## 2011-07-13 VITALS — BP 150/71 | HR 48 | Temp 97.8°F | Ht 66.5 in | Wt 172.2 lb

## 2011-07-13 DIAGNOSIS — C61 Malignant neoplasm of prostate: Secondary | ICD-10-CM

## 2011-07-13 MED ORDER — ABIRATERONE ACETATE 250 MG PO TABS: 1000.0000 mg | ORAL_TABLET | Freq: Every day | ORAL | Status: DC

## 2011-07-13 MED ORDER — PREDNISONE 5 MG PO TABS: 5.0000 mg | ORAL_TABLET | Freq: Two times a day (BID) | ORAL | Status: AC

## 2011-07-13 NOTE — Telephone Encounter (Signed)
gv pt appt schedule for may.  °

## 2011-07-13 NOTE — Progress Notes (Signed)
zytiga script given to elizabeth in managed care for financial assistance, per dr Clelia Croft.

## 2011-07-13 NOTE — Progress Notes (Signed)
Hematology and Oncology Follow Up Visit  Shane [name] [id number] 1926/05/27 78 y.o. 07/13/2011 1:55 PM  CC: Shane [name], M.D.  Shane [name], M.D.    Principle Diagnosis:This is an 78 year old gentleman with prostate cancer diagnosed in 57.  He currently has metastatic disease that is asymptomatic.   Prior Therapy: 1. Status post prostatectomy and lymphadenectomy.  He had involvement of the seminal vesicles. 2. Patient treated with adjuvant radiation therapy. 3. Patient developed recurrent disease treated with Lupron and subsequently with Lupron and Casodex and Casodex withdrawal. 4. Patient treated with second-line hormonal manipulation, including ketoconazole, prednisone and subsequently had relapse with PSA up to 47. 5. Received Provenge immunotherapy.  He had treated with 3 Provenge infusions, the last of which was given on 12/17/2010.    Current therapy: Currently on hormonal deprivation 30 mg every 4 months-6 months. He is under consideration to start Zytiga.   Interim History:  Shane [name] presents today for a followup visit.  He has continued to be asymptomatic from his disease.  He is not reporting any symptoms of chest pain or difficulty breathing.  He had not reported any back pain.  Did not report any genitourinary complaints.  Performance status and activity level remain excellent at this time.  Fortunately, he has continued to be minimally symptomatic or asymptomatic from his cancer. No recent hospitalizations or illnesses.  No bone pain or decline in his energy list.   Medications: I have reviewed the patient's current medications. Current outpatient prescriptions:amLODipine (NORVASC) 5 MG tablet, Take 5 mg by mouth daily., Disp: , Rfl: ;  aspirin 81 MG tablet, Take 81 mg by mouth daily., Disp: , Rfl: ;  atorvastatin (LIPITOR) 20 MG tablet, Take 20 mg by mouth daily., Disp: , Rfl: ;  cholecalciferol (VITAMIN D) 400 UNITS TABS, Take 400 Units by mouth daily., Disp:  , Rfl:  gemfibrozil (LOPID) 600 MG tablet, Take 600 mg by mouth 2 (two) times daily before a meal., Disp: , Rfl: ;  glipiZIDE (GLUCOTROL) 5 MG tablet, Take 5 mg by mouth 2 (two) times daily before a meal., Disp: , Rfl: ;  GuanFACINE HCl (TENEX PO), Take by mouth., Disp: , Rfl: ;  losartan-hydrochlorothiazide (HYZAAR) 100-25 MG per tablet, Take 1 tablet by mouth daily., Disp: , Rfl:  metoCLOPramide (REGLAN) 10 MG tablet, Take 10 mg by mouth 4 (four) times daily., Disp: , Rfl: ;  omeprazole (PRILOSEC) 20 MG capsule, Take 20 mg by mouth daily., Disp: , Rfl: ;  sulindac (CLINORIL) 200 MG tablet, Take 200 mg by mouth 2 (two) times daily., Disp: , Rfl: ;  vitamin C (ASCORBIC ACID) 500 MG tablet, Take 500 mg by mouth daily., Disp: , Rfl:  abiraterone Acetate (ZYTIGA) 250 MG tablet, Take 4 tablets (1,000 mg total) by mouth daily. Take on an empty stomach 1 hour before or 2 hours after a meal, Disp: 120 tablet, Rfl: 1;  predniSONE (DELTASONE) 5 MG tablet, Take 1 tablet (5 mg total) by mouth 2 (two) times daily., Disp: 60 tablet, Rfl: 3  Allergies: No Known Allergies  Past Medical History, Surgical history, Social history, and Family History were reviewed and updated.  Review of Systems: Constitutional:  Negative for fever, chills, night sweats, anorexia, weight loss, pain. Cardiovascular: no chest pain or dyspnea on exertion Respiratory: no cough, shortness of breath, or wheezing Neurological: no TIA or stroke symptoms Dermatological: negative ENT: negative Skin: Negative. Gastrointestinal: no abdominal pain, change in bowel habits, or black or bloody stools Genito-Urinary: no dysuria,  trouble voiding, or hematuria Hematological and Lymphatic: negative Breast: negative Musculoskeletal: negative Remaining ROS negative. Physical Exam: Blood pressure 150/71, pulse 48, temperature 97.8 F (36.6 C), temperature source Oral, height 5' 6.5" (1.689 m), weight 172 lb 3.2 oz (78.109 kg). ECOG: 1 General  appearance: alert Head: Normocephalic, without obvious abnormality, atraumatic Neck: no adenopathy, no carotid bruit, no JVD, supple, symmetrical, trachea midline and thyroid not enlarged, symmetric, no tenderness/mass/nodules Lymph nodes: Cervical, supraclavicular, and axillary nodes normal. Heart:regular rate and rhythm, S1, S2 normal, no murmur, click, rub or gallop Lung:chest clear, no wheezing, rales, normal symmetric air entry Abdomin: soft, non-tender, without masses or organomegaly EXT:no erythema, induration, or nodules   Lab Results: Lab Results  Component Value Date   WBC 4.3 07/06/2011   HGB 12.1* 07/06/2011   HCT 37.1* 07/06/2011   MCV 88.1 07/06/2011   PLT 140 07/06/2011     Chemistry      Component Value Date/Time   NA 141 07/06/2011 0855   NA 138 06/01/2011 0959   K 3.6 07/06/2011 0855   K 3.9 06/01/2011 0959   CL 104 07/06/2011 0855   CL 103 06/01/2011 0959   CO2 28 07/06/2011 0855   CO2 26 06/01/2011 0959   BUN 18 07/06/2011 0855   BUN 20 06/01/2011 0959   CREATININE 1.6* 07/06/2011 0855   CREATININE 1.49* 06/01/2011 0959      Component Value Date/Time   CALCIUM 8.8 07/06/2011 0855   CALCIUM 9.6 06/01/2011 0959   ALKPHOS 56 07/06/2011 0855   ALKPHOS 43 06/01/2011 0959   AST 22 07/06/2011 0855   AST 18 06/01/2011 0959   ALT 10 06/01/2011 0959   BILITOT 0.80 07/06/2011 0855   BILITOT 0.7 06/01/2011 0959     Results for Shane [name] (MRN [id number]) as of 07/13/2011 13:56  Ref. Range 06/01/2011 09:59 07/06/2011 08:55  PSA Latest Range: <=4.00 ng/mL 166.50 (H) 171.70 (H)   CT CHEST, ABDOMEN AND PELVIS WITH CONTRAST  Technique: Multidetector CT imaging of the chest, abdomen and  pelvis was performed following the standard protocol during bolus  administration of intravenous contrast.  Contrast: 100 ml Omnipaque 300  Comparison: CT 09/22/2010  CT CHEST  Findings: No axillary or supraclavicular lymphadenopathy.  Subcarinal lymph node measures 13 mm increased from 9 mm on  prior.  Left paratracheal lymph node measures 15 mm (image 27) unchanged  from 15 mm on prior. No hilar adenopathy. Review of the lung  parenchyma demonstrates no new pulmonary nodules. Nodular  thickening along the left hemidiaphragm is similar to prior  measuring 9 mm in greatest dimension and compares to 9 mm on prior.  There is a nodule along the paravertebral left hemithorax adjacent  to the aorta measuring 14 mm (image 49). This is increased from 12  mm on prior.  IMPRESSION:  1. Left paravertebral nodule is increased in size concerning for  disease progression.  2. Stable nodular thickening over the left hemidiaphragm.  3. Subcarinal lymph node measures larger than comparison.  Recommend attention on follow-up. Left paratracheal lymph node is  unchanged.  CT ABDOMEN AND PELVIS  Findings: No focal hepatic lesion. The gallbladder is collapsed.  The pancreas, spleen, and kidneys are normal. Adrenal glands are  thickened similar to prior.  The stomach, small bowel, and colon  The abdominal aorta normal caliber.  The right common iliac lymph node is increased in size compared to  prior measuring 46 x 34 mm compared to the 27 x 18 mm. No  additional enlarged nodes are present.  Prior prostatectomy. The bladder appears normal. There is a right  inguinal hernia which contains a short loop of nonobstructed small  bowel. Penile prosthetic noted. Review of bone windows  demonstrates no aggressive osseous lesions.  IMPRESSION:  1. Interval progression of right iliac nodal metastasis.  2. No additional metastasis evident within the abdomen or pelvis.  3. Right inguinal hernia with nonobstructive small bowel may be at  risk for small bowel obstruction.  Original Report Authenticated By: Genevive Bi, M.D.  Impression and Plan: This is a pleasant 78 year old gentleman with the following issues: 1. Castration-resistant prostate cancer.  He is status post Provenge immunotherapy.  At  this time, he is on current close observation. His recent PSA as mentioned above did show increased up to 171. His CT scan was also discussed with him in details today. It did show interval nodal progression in the right iliac nodal area. For that reason, I discussed the risks and benefits of starting salvage therapy with Zytiga. I discussed with him the toxicity associated with this drug that includes but not limited to: GI toxicity, weakness and fatigue, adrenal insufficiency, fluid retention, low potassium, and hyperglycemia associated with prednisone. I also given him the alternative with continued observation as well as systemic a chemotherapy he elected to proceed with Zytiga at this time. I will get that process started to get him started on Zytiga as soon as possible. We will reevaluate him in about 4-5 weeks to assess toxicities at this time. All his questions were answered today. 2. Androgen deprivations: I will check his testosterone level and give him Lupron as needed. His last testosterone indicating a castrate level we will consider androgen deprivation retreatment in the future.  Eli Hose, MD 4/3/20131:55 PM

## 2011-07-13 NOTE — Progress Notes (Signed)
Faxed Zytiga prescription to [facility] @ [phone].

## 2011-07-21 ENCOUNTER — Encounter: Payer: Self-pay | Admitting: *Deleted

## 2011-07-21 NOTE — Progress Notes (Signed)
Faxed physician verification form  For co-pay assistance for zytiga fax # [phone]

## 2011-08-16 ENCOUNTER — Encounter: Payer: Medicare Other | Admitting: Oncology

## 2011-08-16 ENCOUNTER — Other Ambulatory Visit: Payer: Medicare Other | Admitting: Lab

## 2011-08-18 ENCOUNTER — Other Ambulatory Visit: Payer: Self-pay | Admitting: *Deleted

## 2011-08-18 MED ORDER — PREDNISONE 5 MG PO TABS: 5.0000 mg | ORAL_TABLET | Freq: Two times a day (BID) | ORAL | Status: DC

## 2011-08-18 NOTE — Telephone Encounter (Signed)
Confirmation that Zytiga shipped 08/17/11.

## 2011-08-25 NOTE — Progress Notes (Signed)
This encounter was created in error - please disregard.

## 2011-08-31 ENCOUNTER — Telehealth: Payer: Self-pay | Admitting: Oncology

## 2011-08-31 NOTE — Telephone Encounter (Signed)
Returned call and lmonvm for pt re new appt for 5/30 @ 2:45 pm.

## 2011-09-01 ENCOUNTER — Telehealth: Payer: Self-pay | Admitting: Oncology

## 2011-09-01 NOTE — Telephone Encounter (Signed)
Pt called today to confirm appt for 5/30.

## 2011-09-08 ENCOUNTER — Ambulatory Visit (HOSPITAL_BASED_OUTPATIENT_CLINIC_OR_DEPARTMENT_OTHER): Payer: Medicare Other | Admitting: Oncology

## 2011-09-08 ENCOUNTER — Encounter: Payer: Self-pay | Admitting: Oncology

## 2011-09-08 ENCOUNTER — Telehealth: Payer: Self-pay | Admitting: Oncology

## 2011-09-08 ENCOUNTER — Other Ambulatory Visit (HOSPITAL_BASED_OUTPATIENT_CLINIC_OR_DEPARTMENT_OTHER): Payer: Medicare Other | Admitting: Lab

## 2011-09-08 VITALS — BP 143/66 | HR 48 | Temp 98.7°F | Ht 65.5 in | Wt 170.7 lb

## 2011-09-08 DIAGNOSIS — C61 Malignant neoplasm of prostate: Secondary | ICD-10-CM

## 2011-09-08 DIAGNOSIS — C7982 Secondary malignant neoplasm of genital organs: Secondary | ICD-10-CM

## 2011-09-08 LAB — COMPREHENSIVE METABOLIC PANEL
Alkaline Phosphatase: 45 U/L (ref 39–117)
BUN: 27 mg/dL — ABNORMAL HIGH (ref 6–23)
CO2: 26 mEq/L (ref 19–32)
Creatinine, Ser: 1.5 mg/dL — ABNORMAL HIGH (ref 0.50–1.35)
Glucose, Bld: 119 mg/dL — ABNORMAL HIGH (ref 70–99)
Total Bilirubin: 0.8 mg/dL (ref 0.3–1.2)

## 2011-09-08 LAB — CBC WITH DIFFERENTIAL/PLATELET
Basophils Absolute: 0 10*3/uL (ref 0.0–0.1)
EOS%: 0.3 % (ref 0.0–7.0)
Eosinophils Absolute: 0 10*3/uL (ref 0.0–0.5)
HGB: 12.4 g/dL — ABNORMAL LOW (ref 13.0–17.1)
LYMPH%: 11 % — ABNORMAL LOW (ref 14.0–49.0)
MCH: 29.3 pg (ref 27.2–33.4)
MCV: 91.2 fL (ref 79.3–98.0)
MONO%: 7.8 % (ref 0.0–14.0)
NEUT#: 4.7 10*3/uL (ref 1.5–6.5)
Platelets: 126 10*3/uL — ABNORMAL LOW (ref 140–400)
RDW: 13.6 % (ref 11.0–14.6)

## 2011-09-08 LAB — PSA: PSA: 90.76 ng/mL — ABNORMAL HIGH (ref <=4.00)

## 2011-09-08 NOTE — Progress Notes (Signed)
Hematology and Oncology Follow Up Visit  Timothy Trudell [id number] May 11, 1926 78 y.o. 09/08/2011 4:03 PM  CC: [name], M.D.  [name], M.D.    Principle Diagnosis:This is an 78 year old gentleman with prostate cancer diagnosed in 89.  He currently has metastatic disease that is asymptomatic.   Prior Therapy: 1. Status post prostatectomy and lymphadenectomy.  He had involvement of the seminal vesicles. 2. Patient treated with adjuvant radiation therapy. 3. Patient developed recurrent disease treated with Lupron and subsequently with Lupron and Casodex and Casodex withdrawal. 4. Patient treated with second-line hormonal manipulation, including ketoconazole, prednisone and subsequently had relapse with PSA up to 47. 5. Received Provenge immunotherapy.  He had treated with 3 Provenge infusions, the last of which was given on 12/17/2010.    Current therapy: Currently on hormonal deprivation 30 mg every 4 months-6 months. Zytiga 1000 mg daily beginning in April 2013.   Interim History:  Mr. Sparks presents today for a followup visit.  He started on Zytiga in April 2013 and overall has tolerated this well. No abdominal pain, nausea, or vomiting. He has continued to be asymptomatic from his disease.  He is not reporting any symptoms of chest pain or difficulty breathing.  He had not reported any back pain.  Did not report any genitourinary complaints.  Performance status and activity level remain excellent at this time.  No recent hospitalizations or illnesses. No bone pain or decline in his energy list.   Medications: I have reviewed the patient's current medications. Current outpatient prescriptions:abiraterone Acetate (ZYTIGA) 250 MG tablet, Take 4 tablets (1,000 mg total) by mouth daily. Take on an empty stomach 1 hour before or 2 hours after a meal, Disp: 120 tablet, Rfl: 1;  amLODipine (NORVASC) 5 MG tablet, Take 5 mg by mouth daily., Disp: , Rfl: ;  aspirin 81 MG tablet,  Take 81 mg by mouth daily., Disp: , Rfl: ;  atorvastatin (LIPITOR) 20 MG tablet, Take 20 mg by mouth daily., Disp: , Rfl:  cholecalciferol (VITAMIN D) 400 UNITS TABS, Take 400 Units by mouth daily., Disp: , Rfl: ;  gemfibrozil (LOPID) 600 MG tablet, Take 600 mg by mouth 2 (two) times daily before a meal., Disp: , Rfl: ;  glipiZIDE (GLUCOTROL) 5 MG tablet, Take 5 mg by mouth 2 (two) times daily before a meal., Disp: , Rfl: ;  GuanFACINE HCl (TENEX PO), Take by mouth., Disp: , Rfl:  losartan-hydrochlorothiazide (HYZAAR) 100-25 MG per tablet, Take 1 tablet by mouth daily., Disp: , Rfl: ;  metoCLOPramide (REGLAN) 10 MG tablet, Take 10 mg by mouth 4 (four) times daily., Disp: , Rfl: ;  omeprazole (PRILOSEC) 20 MG capsule, Take 20 mg by mouth daily., Disp: , Rfl: ;  sulindac (CLINORIL) 200 MG tablet, Take 200 mg by mouth 2 (two) times daily., Disp: , Rfl:  vitamin C (ASCORBIC ACID) 500 MG tablet, Take 500 mg by mouth daily., Disp: , Rfl:   Allergies: No Known Allergies  Past Medical History, Surgical history, Social history, and Family History were reviewed and updated.  Review of Systems: Constitutional:  Negative for fever, chills, night sweats, anorexia, weight loss, pain. Cardiovascular: no chest pain or dyspnea on exertion Respiratory: no cough, shortness of breath, or wheezing Neurological: no TIA or stroke symptoms Dermatological: negative ENT: negative Skin: Negative. Gastrointestinal: no abdominal pain, change in bowel habits, or black or bloody stools Genito-Urinary: no dysuria, trouble voiding, or hematuria Hematological and Lymphatic: negative Breast: negative Musculoskeletal: negative Remaining ROS negative.  Physical  Exam: Blood pressure 143/66, pulse 48, temperature 98.7 F (37.1 C), temperature source Oral, height 5' 5.5" (1.664 m), weight 170 lb 11.2 oz (77.429 kg). ECOG: 1 General appearance: alert Head: Normocephalic, without obvious abnormality, atraumatic Neck: no  adenopathy, no carotid bruit, no JVD, supple, symmetrical, trachea midline and thyroid not enlarged, symmetric, no tenderness/mass/nodules Lymph nodes: Cervical, supraclavicular, and axillary nodes normal. Heart:regular rate and rhythm, S1, S2 normal, no murmur, click, rub or gallop Lung:chest clear, no wheezing, rales, normal symmetric air entry Abdomen: soft, non-tender, without masses or organomegaly EXT:no erythema, induration, or nodules   Lab Results: Lab Results  Component Value Date   WBC 5.8 09/08/2011   HGB 12.4* 09/08/2011   HCT 38.6 09/08/2011   MCV 91.2 09/08/2011   PLT 126* 09/08/2011     Chemistry      Component Value Date/Time   NA 141 07/06/2011 0855   NA 138 06/01/2011 0959   K 3.6 07/06/2011 0855   K 3.9 06/01/2011 0959   CL 104 07/06/2011 0855   CL 103 06/01/2011 0959   CO2 28 07/06/2011 0855   CO2 26 06/01/2011 0959   BUN 18 07/06/2011 0855   BUN 20 06/01/2011 0959   CREATININE 1.6* 07/06/2011 0855   CREATININE 1.49* 06/01/2011 0959      Component Value Date/Time   CALCIUM 8.8 07/06/2011 0855   CALCIUM 9.6 06/01/2011 0959   ALKPHOS 56 07/06/2011 0855   ALKPHOS 43 06/01/2011 0959   AST 22 07/06/2011 0855   AST 18 06/01/2011 0959   ALT 10 06/01/2011 0959   BILITOT 0.80 07/06/2011 0855   BILITOT 0.7 06/01/2011 0959     Results for Staff, BRADSHAW (MRN [id number]) as of 09/08/2011 16:05  Ref. Range 08/31/2010 13:17 01/26/2011 15:03 01/26/2011 15:03 06/01/2011 09:59 07/06/2011 08:55  PSA Latest Range: <=4.00 ng/mL 47.49 (H) 145.00 (H) 145.00 (H) 166.50 (H) 171.70 (H)   Impression and Plan: This is a pleasant 78 year old gentleman with the following issues: 1. Castration-resistant prostate cancer.  He is status post Provenge immunotherapy. Currently on Zytiga and he is tolerating this well. He is asymptomatic from his prostate cancer at this time. Recommend that he continue this for now. PSA is pending today. 2. Androgen deprivation: I will check his testosterone level and give  him Lupron as needed. His last testosterone indicating a castrate level we will consider androgen deprivation retreatment in the future. 3. Follow-up: In 4-5 weeks.  [place], [place] 5/30/20134:03 PM

## 2011-09-08 NOTE — Telephone Encounter (Signed)
Gv pt appt for [id number] 

## 2011-09-13 ENCOUNTER — Telehealth: Payer: Self-pay | Admitting: *Deleted

## 2011-09-13 NOTE — Telephone Encounter (Signed)
[facility] faxed confirmation of prescription shipment.  Shane [name] was shipped 09-12-2011 with next business day delivery.

## 2011-10-07 ENCOUNTER — Other Ambulatory Visit: Payer: Self-pay | Admitting: *Deleted

## 2011-10-07 ENCOUNTER — Ambulatory Visit (HOSPITAL_BASED_OUTPATIENT_CLINIC_OR_DEPARTMENT_OTHER): Payer: Medicare Other | Admitting: Oncology

## 2011-10-07 ENCOUNTER — Other Ambulatory Visit (HOSPITAL_BASED_OUTPATIENT_CLINIC_OR_DEPARTMENT_OTHER): Payer: Medicare Other | Admitting: Lab

## 2011-10-07 VITALS — BP 125/65 | HR 63 | Temp 97.2°F | Ht 65.5 in | Wt 170.1 lb

## 2011-10-07 DIAGNOSIS — C61 Malignant neoplasm of prostate: Secondary | ICD-10-CM

## 2011-10-07 DIAGNOSIS — E291 Testicular hypofunction: Secondary | ICD-10-CM

## 2011-10-07 LAB — COMPREHENSIVE METABOLIC PANEL
ALT: 19 U/L (ref 0–53)
CO2: 27 mEq/L (ref 19–32)
Calcium: 9.6 mg/dL (ref 8.4–10.5)
Chloride: 105 mEq/L (ref 96–112)
Creatinine, Ser: 1.53 mg/dL — ABNORMAL HIGH (ref 0.50–1.35)
Glucose, Bld: 111 mg/dL — ABNORMAL HIGH (ref 70–99)
Total Protein: 6.7 g/dL (ref 6.0–8.3)

## 2011-10-07 LAB — CBC WITH DIFFERENTIAL/PLATELET
BASO%: 0.3 % (ref 0.0–2.0)
Eosinophils Absolute: 0 10*3/uL (ref 0.0–0.5)
HCT: 39 % (ref 38.4–49.9)
HGB: 12.6 g/dL — ABNORMAL LOW (ref 13.0–17.1)
MCHC: 32.5 g/dL (ref 32.0–36.0)
MONO#: 0.6 10*3/uL (ref 0.1–0.9)
NEUT#: 4.9 10*3/uL (ref 1.5–6.5)
NEUT%: 79 % — ABNORMAL HIGH (ref 39.0–75.0)
Platelets: 121 10*3/uL — ABNORMAL LOW (ref 140–400)
WBC: 6.2 10*3/uL (ref 4.0–10.3)
lymph#: 0.7 10*3/uL — ABNORMAL LOW (ref 0.9–3.3)

## 2011-10-07 LAB — PSA: PSA: 24.86 ng/mL — ABNORMAL HIGH (ref <=4.00)

## 2011-10-07 MED ORDER — ABIRATERONE ACETATE 250 MG PO TABS: 1000.0000 mg | ORAL_TABLET | Freq: Every day | ORAL | Status: DC

## 2011-10-07 NOTE — Progress Notes (Signed)
Hematology and Oncology Follow Up Visit  Shane [name] [id number] [birth year] 78 y.o. 10/07/2011 2:59 PM  CC: [name], M.D.  [name], M.D.    Principle Diagnosis:This is an 78 year old gentleman with prostate cancer diagnosed in 34.  He currently has metastatic disease that is asymptomatic.   Prior Therapy: 1. Status post prostatectomy and lymphadenectomy.  He had involvement of the seminal vesicles. 2. Patient treated with adjuvant radiation therapy. 3. Patient developed recurrent disease treated with Lupron and subsequently with Lupron and Casodex and Casodex withdrawal. 4. Patient treated with second-line hormonal manipulation, including ketoconazole, prednisone and subsequently had relapse with PSA up to 47. 5. Received Provenge immunotherapy.  He had treated with 3 Provenge infusions, the last of which was given on 12/17/2010.    Current therapy: Currently on hormonal deprivation 30 mg as needed if his testosterone is more than 20.  Zytiga 1000 mg daily beginning in April 2013.   Interim History:  Shane [name] presents today for a followup visit.  He started on Zytiga in April 2013 and overall has tolerated this well. No abdominal pain, nausea, or vomiting. He has continued to be asymptomatic from his disease.  He is not reporting any symptoms of chest pain or difficulty breathing.  He had not reported any back pain.  Did not report any genitourinary complaints.  Performance status and activity level remain excellent at this time.  No recent hospitalizations or illnesses. No bone pain and actually his energy is better.  No new complaints since the last visit.   Medications: I have reviewed the patient's current medications. Current outpatient prescriptions:amLODipine (NORVASC) 5 MG tablet, Take 5 mg by mouth daily., Disp: , Rfl: ;  aspirin 81 MG tablet, Take 81 mg by mouth daily., Disp: , Rfl: ;  atorvastatin (LIPITOR) 20 MG tablet, Take 20 mg by mouth daily., Disp:  , Rfl: ;  cholecalciferol (VITAMIN D) 400 UNITS TABS, Take 400 Units by mouth daily., Disp: , Rfl:  gemfibrozil (LOPID) 600 MG tablet, Take 600 mg by mouth 2 (two) times daily before a meal., Disp: , Rfl: ;  glipiZIDE (GLUCOTROL) 5 MG tablet, Take 5 mg by mouth 2 (two) times daily before a meal., Disp: , Rfl: ;  GuanFACINE HCl (TENEX PO), Take by mouth., Disp: , Rfl: ;  losartan-hydrochlorothiazide (HYZAAR) 100-25 MG per tablet, Take 1 tablet by mouth daily., Disp: , Rfl:  metoCLOPramide (REGLAN) 10 MG tablet, Take 10 mg by mouth 4 (four) times daily., Disp: , Rfl: ;  omeprazole (PRILOSEC) 20 MG capsule, Take 20 mg by mouth daily., Disp: , Rfl: ;  sulindac (CLINORIL) 200 MG tablet, Take 200 mg by mouth 2 (two) times daily., Disp: , Rfl: ;  vitamin C (ASCORBIC ACID) 500 MG tablet, Take 500 mg by mouth daily., Disp: , Rfl:  abiraterone Acetate (ZYTIGA) 250 MG tablet, Take 4 tablets (1,000 mg total) by mouth daily. Take on an empty stomach 1 hour before or 2 hours after a meal, Disp: 120 tablet, Rfl: 1  Allergies: No Known Allergies  Past Medical History, Surgical history, Social history, and Family History were reviewed and updated.  Review of Systems: Constitutional:  Negative for fever, chills, night sweats, anorexia, weight loss, pain. Cardiovascular: no chest pain or dyspnea on exertion Respiratory: no cough, shortness of breath, or wheezing Neurological: no TIA or stroke symptoms Dermatological: negative ENT: negative Skin: Negative. Gastrointestinal: no abdominal pain, change in bowel habits, or black or bloody stools Genito-Urinary: no dysuria, trouble voiding, or  hematuria Hematological and Lymphatic: negative Breast: negative Musculoskeletal: negative Remaining ROS negative.  Physical Exam: Blood pressure 125/65, pulse 63, temperature 97.2 F (36.2 C), temperature source Oral, height 5' 5.5" (1.664 m), weight 170 lb 1.6 oz (77.157 kg). ECOG: 1 General appearance: alert Head:  Normocephalic, without obvious abnormality, atraumatic Neck: no adenopathy, no carotid bruit, no JVD, supple, symmetrical, trachea midline and thyroid not enlarged, symmetric, no tenderness/mass/nodules Lymph nodes: Cervical, supraclavicular, and axillary nodes normal. Heart:regular rate and rhythm, S1, S2 normal, no murmur, click, rub or gallop Lung:chest clear, no wheezing, rales, normal symmetric air entry Abdomen: soft, non-tender, without masses or organomegaly EXT:no erythema, induration, or nodules   Lab Results: Lab Results  Component Value Date   WBC 6.2 10/07/2011   HGB 12.6* 10/07/2011   HCT 39.0 10/07/2011   MCV 92.7 10/07/2011   PLT 121* 10/07/2011    Results for Shane [name], Shane [name] (MRN [id number]) as of 10/07/2011 14:47  Ref. Range 07/06/2011 08:55 09/08/2011 13:40  PSA Latest Range: <=4.00 ng/mL 171.70 (H) 90.76 (H)     Impression and Plan: This is a pleasant 78 year old gentleman with the following issues: 1. Castration-resistant prostate cancer.  He is status post Provenge immunotherapy. Currently on Zytiga and he is tolerating this well. He is asymptomatic from his prostate cancer at this time. Recommend that he continue this for now. PSA responded nicely after the first month of therapy from 171 to 90.76. 2. Androgen deprivation: I will check his testosterone level and give him Lupron as needed. His last testosterone indicating a castrate level we will consider androgen deprivation retreatment in the future. 3. Follow-up: In 4-5 weeks.  Shane [name] 6/28/20132:59 PM

## 2011-10-07 NOTE — Telephone Encounter (Signed)
appts made and printed for  Pt aom °

## 2011-10-10 ENCOUNTER — Other Ambulatory Visit: Payer: Self-pay | Admitting: *Deleted

## 2011-10-10 ENCOUNTER — Telehealth: Payer: Self-pay | Admitting: *Deleted

## 2011-10-10 MED ORDER — ABIRATERONE ACETATE 250 MG PO TABS: 1000.0000 mg | ORAL_TABLET | Freq: Every day | ORAL | Status: DC

## 2011-10-10 NOTE — Telephone Encounter (Signed)
Called patient with PSA results drawn 10/07/2011.

## 2011-10-11 ENCOUNTER — Other Ambulatory Visit: Payer: Self-pay | Admitting: *Deleted

## 2011-10-11 ENCOUNTER — Encounter: Payer: Self-pay | Admitting: *Deleted

## 2011-10-11 NOTE — Progress Notes (Signed)
RECEIVED A FAX FROM [facility] CONCERNING A CONFIRMATION OF FACSIMILE RECEIPT FOR REFERRAL. 

## 2011-10-12 ENCOUNTER — Telehealth: Payer: Self-pay | Admitting: *Deleted

## 2011-10-12 NOTE — Telephone Encounter (Signed)
Received confirmation from [facility] pharmacy that pt's Zytiga was shipped on 10/11/11 for next day delivery.

## 2011-10-14 ENCOUNTER — Other Ambulatory Visit: Payer: Self-pay | Admitting: Oncology

## 2011-10-14 ENCOUNTER — Other Ambulatory Visit: Payer: Self-pay | Admitting: Nephrology

## 2011-11-09 NOTE — Progress Notes (Signed)
Fax rec'd from [facility] that pt's prescription was shipped 11/08/11.  This information given to desk RN.

## 2011-11-15 ENCOUNTER — Other Ambulatory Visit (HOSPITAL_BASED_OUTPATIENT_CLINIC_OR_DEPARTMENT_OTHER): Payer: Medicare Other

## 2011-11-15 ENCOUNTER — Telehealth: Payer: Self-pay | Admitting: Oncology

## 2011-11-15 ENCOUNTER — Encounter: Payer: Self-pay | Admitting: Oncology

## 2011-11-15 ENCOUNTER — Ambulatory Visit (HOSPITAL_BASED_OUTPATIENT_CLINIC_OR_DEPARTMENT_OTHER): Payer: Medicare Other | Admitting: Oncology

## 2011-11-15 VITALS — BP 138/64 | HR 58 | Temp 97.5°F | Resp 18 | Ht 65.5 in | Wt 170.9 lb

## 2011-11-15 DIAGNOSIS — C61 Malignant neoplasm of prostate: Secondary | ICD-10-CM

## 2011-11-15 DIAGNOSIS — E291 Testicular hypofunction: Secondary | ICD-10-CM

## 2011-11-15 DIAGNOSIS — G4700 Insomnia, unspecified: Secondary | ICD-10-CM

## 2011-11-15 DIAGNOSIS — E119 Type 2 diabetes mellitus without complications: Secondary | ICD-10-CM

## 2011-11-15 LAB — CBC WITH DIFFERENTIAL/PLATELET
BASO%: 0.4 % (ref 0.0–2.0)
Basophils Absolute: 0 10*3/uL (ref 0.0–0.1)
EOS%: 0.4 % (ref 0.0–7.0)
MCH: 30.6 pg (ref 27.2–33.4)
MCHC: 32.2 g/dL (ref 32.0–36.0)
MCV: 95.1 fL (ref 79.3–98.0)
MONO%: 5.5 % (ref 0.0–14.0)
RDW: 14.5 % (ref 11.0–14.6)
lymph#: 0.5 10*3/uL — ABNORMAL LOW (ref 0.9–3.3)

## 2011-11-15 LAB — COMPREHENSIVE METABOLIC PANEL
ALT: 17 U/L (ref 0–53)
AST: 16 U/L (ref 0–37)
Albumin: 3.7 g/dL (ref 3.5–5.2)
Alkaline Phosphatase: 45 U/L (ref 39–117)
BUN: 25 mg/dL — ABNORMAL HIGH (ref 6–23)
Calcium: 9.3 mg/dL (ref 8.4–10.5)
Chloride: 105 mEq/L (ref 96–112)
Creatinine, Ser: 1.49 mg/dL — ABNORMAL HIGH (ref 0.50–1.35)
Potassium: 4.1 mEq/L (ref 3.5–5.3)

## 2011-11-15 LAB — TESTOSTERONE: Testosterone: <10 ng/dL — ABNORMAL LOW (ref 300–890)

## 2011-11-15 MED ORDER — LORAZEPAM 0.5 MG PO TABS: 0.5000 mg | ORAL_TABLET | Freq: Every evening | ORAL | Status: AC | PRN

## 2011-11-15 NOTE — Progress Notes (Signed)
Hematology and Oncology Follow Up Visit  Shane [name] [id number] [birth year] 78 y.o. 11/15/2011 2:18 PM  CC: Shane [name], M.D.  Shane [name], M.D.    Principle Diagnosis:This is an 78 year old gentleman with prostate cancer diagnosed in 73.  He currently has metastatic disease that is asymptomatic.   Prior Therapy: 1. Status post prostatectomy and lymphadenectomy.  He had involvement of the seminal vesicles. 2. Patient treated with adjuvant radiation therapy. 3. Patient developed recurrent disease treated with Lupron and subsequently with Lupron and Casodex and Casodex withdrawal. 4. Patient treated with second-line hormonal manipulation, including ketoconazole, prednisone and subsequently had relapse with PSA up to 47. 5. Received Provenge immunotherapy.  He had treated with 3 Provenge infusions, the last of which was given on 12/17/2010.    Current therapy: Currently on hormonal deprivation 30 mg as needed if his testosterone is more than 20.  Zytiga 1000 mg daily beginning in April 2013.  He is also on Prednisone 5 mg twice a day.  Interim History:  Shane [name] presents today for a followup visit.  He started on Zytiga in April 2013 and overall has tolerated this well. No abdominal pain, nausea, or vomiting. He has continued to be asymptomatic from his disease.  He is not reporting any symptoms of chest pain or difficulty breathing.  He had not reported any back pain.  Did not report any genitourinary complaints.  Performance status and activity level remain excellent at this time.  No recent hospitalizations or illnesses. No bone pain and actually his energy is better. He reports that his blood sugars have been elevated in the morning and he is having some difficulty sleeping.   Medications: I have reviewed the patient's current medications. Current outpatient prescriptions:abiraterone Acetate (ZYTIGA) 250 MG tablet, Take 4 tablets (1,000 mg total) by mouth daily. Take on an  empty stomach 1 hour before or 2 hours after a meal, Disp: 120 tablet, Rfl: 1;  amLODipine (NORVASC) 5 MG tablet, Take 5 mg by mouth daily., Disp: , Rfl: ;  aspirin 81 MG tablet, Take 81 mg by mouth daily., Disp: , Rfl: ;  atorvastatin (LIPITOR) 20 MG tablet, Take 20 mg by mouth daily., Disp: , Rfl:  cholecalciferol (VITAMIN D) 400 UNITS TABS, Take 400 Units by mouth daily., Disp: , Rfl: ;  gemfibrozil (LOPID) 600 MG tablet, Take 600 mg by mouth 2 (two) times daily before a meal., Disp: , Rfl: ;  glipiZIDE (GLUCOTROL) 5 MG tablet, Take 5 mg by mouth 2 (two) times daily before a meal., Disp: , Rfl: ;  GuanFACINE HCl (TENEX PO), Take by mouth., Disp: , Rfl:  LORazepam (ATIVAN) 0.5 MG tablet, Take 1 tablet (0.5 mg total) by mouth at bedtime as needed for anxiety., Disp: 30 tablet, Rfl: 0;  losartan-hydrochlorothiazide (HYZAAR) 100-25 MG per tablet, Take 1 tablet by mouth daily., Disp: , Rfl: ;  metoCLOPramide (REGLAN) 10 MG tablet, Take 10 mg by mouth 4 (four) times daily., Disp: , Rfl: ;  omeprazole (PRILOSEC) 20 MG capsule, Take 20 mg by mouth daily., Disp: , Rfl:  predniSONE (DELTASONE) 5 MG tablet, TAKE 1 TABLET (5 MG TOTAL) BY MOUTH 2 (TWO) TIMES DAILY., Disp: 120 tablet, Rfl: 0;  sulindac (CLINORIL) 200 MG tablet, Take 200 mg by mouth 2 (two) times daily., Disp: , Rfl: ;  vitamin C (ASCORBIC ACID) 500 MG tablet, Take 500 mg by mouth daily., Disp: , Rfl:   Allergies: No Known Allergies  Past Medical History, Surgical history, Social history, and  Family History were reviewed and updated.  Review of Systems: Constitutional:  Negative for fever, chills, night sweats, anorexia, weight loss, pain. Cardiovascular: no chest pain or dyspnea on exertion Respiratory: no cough, shortness of breath, or wheezing Neurological: no TIA or stroke symptoms Dermatological: negative ENT: negative Skin: Negative. Gastrointestinal: no abdominal pain, change in bowel habits, or black or bloody stools Genito-Urinary: no  dysuria, trouble voiding, or hematuria Hematological and Lymphatic: negative Breast: negative Musculoskeletal: negative Remaining ROS negative.  Physical Exam: Blood pressure 138/64, pulse 58, temperature 97.5 F (36.4 C), temperature source Oral, resp. rate 18, height 5' 5.5" (1.664 m), weight 170 lb 14.4 oz (77.52 kg). ECOG: 1 General appearance: alert Head: Normocephalic, without obvious abnormality, atraumatic Neck: no adenopathy, no carotid bruit, no JVD, supple, symmetrical, trachea midline and thyroid not enlarged, symmetric, no tenderness/mass/nodules Lymph nodes: Cervical, supraclavicular, and axillary nodes normal. Heart:regular rate and rhythm, S1, S2 normal, no murmur, click, rub or gallop Lung:chest clear, no wheezing, rales, normal symmetric air entry Abdomen: soft, non-tender, without masses or organomegaly EXT:no erythema, induration, or nodules   Lab Results: Lab Results  Component Value Date   WBC 5.5 11/15/2011   HGB 12.3* 11/15/2011   HCT 38.2* 11/15/2011   MCV 95.1 11/15/2011   PLT 112* 11/15/2011    Results for Shane [name] (MRN [id number]) as of 11/15/2011 14:18  Ref. Range 01/26/2011 15:03 06/01/2011 09:59 07/06/2011 08:55 09/08/2011 13:40 10/07/2011 14:30  PSA Latest Range: <=4.00 ng/mL 145.00 (H) 166.50 (H) 171.70 (H) 90.76 (H) 24.86 (H)    Impression and Plan: This is a pleasant 78 year old gentleman with the following issues: 1. Castration-resistant prostate cancer.  He is status post Provenge immunotherapy. Currently on Zytiga and he is tolerating this well. He is asymptomatic from his prostate cancer at this time. Recommend that he continue this for now. PSA responded nicely and is down from 171 to 24.86. PSA is pending today. 2. Androgen deprivation: I will check his testosterone level and give him Lupron as needed. His last testosterone indicating a castrate level we will consider androgen deprivation retreatment in the future. 3. Diabetes: He reports elevated  BS in the am. I have asked him to decrease his Prednisone to 5 mg daily. Continue Glipizide per PCP. 4. Insomnia: May be related to Prednisone. Hopefully this will improve with decrease of Prednisone to once a day. I have given him a prescription for low dose Ativan to use at bedtime as needed. 5. Follow-up: In 4-5 weeks.  CURCIO, KRISTIN 8/6/20132:18 PM

## 2011-11-15 NOTE — Telephone Encounter (Signed)
appts made and printed for pt aom °

## 2011-11-15 NOTE — Patient Instructions (Addendum)
Decrease your Prednisone to 5 mg once a day.  You may use Lorazepam as needed at bedtime to help you sleep.  For constipation, continue to use Miralax 1 capful mixed in water daily. Begin taking Colace (docusate sodium) twice a day as a stool softener. If your bowels are not moving with this regimen, you may take Milk of Magnesia 30 ml daily as needed.

## 2011-11-16 ENCOUNTER — Telehealth: Payer: Self-pay | Admitting: *Deleted

## 2011-11-16 NOTE — Telephone Encounter (Signed)
Called patient to give PSA results drawn 11/15/2011.  Patient verbalized understanding.

## 2011-11-22 ENCOUNTER — Other Ambulatory Visit: Payer: Self-pay | Admitting: Nephrology

## 2011-12-07 ENCOUNTER — Telehealth: Payer: Self-pay | Admitting: *Deleted

## 2011-12-07 NOTE — Telephone Encounter (Signed)
Rec'd fax from [facility] that patient prescription shipped on 12/06/11 for delivery on 12/07/11.

## 2011-12-22 ENCOUNTER — Other Ambulatory Visit (HOSPITAL_BASED_OUTPATIENT_CLINIC_OR_DEPARTMENT_OTHER): Payer: Medicare Other | Admitting: Lab

## 2011-12-22 ENCOUNTER — Telehealth: Payer: Self-pay | Admitting: Oncology

## 2011-12-22 ENCOUNTER — Ambulatory Visit (HOSPITAL_BASED_OUTPATIENT_CLINIC_OR_DEPARTMENT_OTHER): Payer: Medicare Other | Admitting: Oncology

## 2011-12-22 VITALS — BP 144/63 | HR 69 | Temp 99.0°F | Resp 18 | Ht 65.5 in | Wt 170.4 lb

## 2011-12-22 DIAGNOSIS — C61 Malignant neoplasm of prostate: Secondary | ICD-10-CM

## 2011-12-22 DIAGNOSIS — E119 Type 2 diabetes mellitus without complications: Secondary | ICD-10-CM

## 2011-12-22 DIAGNOSIS — G4700 Insomnia, unspecified: Secondary | ICD-10-CM

## 2011-12-22 LAB — COMPREHENSIVE METABOLIC PANEL (CC13)
ALT: 22 U/L (ref 0–55)
Albumin: 3.5 g/dL (ref 3.5–5.0)
CO2: 23 mEq/L (ref 22–29)
Potassium: 3.8 mEq/L (ref 3.5–5.1)
Sodium: 140 mEq/L (ref 136–145)
Total Bilirubin: 1.2 mg/dL (ref 0.20–1.20)
Total Protein: 6.6 g/dL (ref 6.4–8.3)

## 2011-12-22 LAB — CBC WITH DIFFERENTIAL/PLATELET
BASO%: 0.6 % (ref 0.0–2.0)
Eosinophils Absolute: 0 10*3/uL (ref 0.0–0.5)
HCT: 37.5 % — ABNORMAL LOW (ref 38.4–49.9)
HGB: 12 g/dL — ABNORMAL LOW (ref 13.0–17.1)
MCHC: 31.9 g/dL — ABNORMAL LOW (ref 32.0–36.0)
MONO#: 0.4 10*3/uL (ref 0.1–0.9)
NEUT#: 3.4 10*3/uL (ref 1.5–6.5)
NEUT%: 75.1 % — ABNORMAL HIGH (ref 39.0–75.0)
Platelets: 102 10*3/uL — ABNORMAL LOW (ref 140–400)
WBC: 4.6 10*3/uL (ref 4.0–10.3)
lymph#: 0.7 10*3/uL — ABNORMAL LOW (ref 0.9–3.3)

## 2011-12-22 LAB — PSA: PSA: 2.27 ng/mL (ref <=4.00)

## 2011-12-22 NOTE — Telephone Encounter (Signed)
Gave pt appt for October 17th with ML and labs

## 2011-12-22 NOTE — Progress Notes (Signed)
Hematology and Oncology Follow Up Visit  Elra Bolan [id number] Oct 09, 1926 78 y.o. 12/22/2011 5:01 PM  CC: [name], M.D.  [name], M.D.    Principle Diagnosis:This is an 78 year old gentleman with prostate cancer diagnosed in 47.  He currently has metastatic disease that is asymptomatic.  Prior Therapy: 1. Status post prostatectomy and lymphadenectomy.  He had involvement of the seminal vesicles. 2. Patient treated with adjuvant radiation therapy. 3. Patient developed recurrent disease treated with Lupron and subsequently with Lupron and Casodex and Casodex withdrawal. 4. Patient treated with second-line hormonal manipulation, including ketoconazole, prednisone and subsequently had relapse with PSA up to 47. 5. Received Provenge immunotherapy.  He had treated with 3 Provenge infusions, the last of which was given on 12/17/2010.    Current therapy: Currently on hormonal deprivation 30 mg as needed if his testosterone is more than 20.  Zytiga 1000 mg daily beginning in April 2013.  He is also on Prednisone 5 mg daily..  Interim History:  Mr. Gettler presents today for a followup visit.  He started on Zytiga in April 2013 and overall has tolerated this well. No abdominal pain, nausea, or vomiting. He has continued to be asymptomatic from his disease.  He is not reporting any symptoms of chest pain or difficulty breathing.  He had not reported any back pain.  Did not report any genitourinary complaints.  Performance status and activity level remain excellent at this time.  No recent hospitalizations or illnesses. No bone pain and actually his energy is better. He reports that his blood sugars are better since decreasing his prednisone dose.    Medications: I have reviewed the patient's current medications. Current outpatient prescriptions:abiraterone Acetate (ZYTIGA) 250 MG tablet, Take 4 tablets (1,000 mg total) by mouth daily. Take on an empty stomach 1 hour before or 2  hours after a meal, Disp: 120 tablet, Rfl: 1;  amLODipine (NORVASC) 5 MG tablet, Take 5 mg by mouth daily., Disp: , Rfl: ;  aspirin 81 MG tablet, Take 81 mg by mouth daily., Disp: , Rfl: ;  atorvastatin (LIPITOR) 20 MG tablet, Take 20 mg by mouth daily., Disp: , Rfl:  cholecalciferol (VITAMIN D) 400 UNITS TABS, Take 400 Units by mouth daily., Disp: , Rfl: ;  gemfibrozil (LOPID) 600 MG tablet, Take 600 mg by mouth 2 (two) times daily before a meal., Disp: , Rfl: ;  glipiZIDE (GLUCOTROL) 5 MG tablet, Take 5 mg by mouth 2 (two) times daily before a meal., Disp: , Rfl: ;  GuanFACINE HCl (TENEX PO), Take by mouth., Disp: , Rfl:  losartan-hydrochlorothiazide (HYZAAR) 100-25 MG per tablet, Take 1 tablet by mouth daily., Disp: , Rfl: ;  metoCLOPramide (REGLAN) 10 MG tablet, Take 10 mg by mouth 4 (four) times daily., Disp: , Rfl: ;  omeprazole (PRILOSEC) 20 MG capsule, Take 20 mg by mouth daily., Disp: , Rfl: ;  predniSONE (DELTASONE) 5 MG tablet, Take 5 mg by mouth daily. , Disp: , Rfl:  sulindac (CLINORIL) 200 MG tablet, Take 200 mg by mouth 2 (two) times daily., Disp: , Rfl: ;  vitamin C (ASCORBIC ACID) 500 MG tablet, Take 500 mg by mouth daily., Disp: , Rfl:   Allergies: No Known Allergies  Past Medical History, Surgical history, Social history, and Family History were reviewed and updated.  Review of Systems: Constitutional:  Negative for fever, chills, night sweats, anorexia, weight loss, pain. Cardiovascular: no chest pain or dyspnea on exertion Respiratory: no cough, shortness of breath, or wheezing Neurological:  no TIA or stroke symptoms Dermatological: negative ENT: negative Skin: Negative. Gastrointestinal: no abdominal pain, change in bowel habits, or black or bloody stools Genito-Urinary: no dysuria, trouble voiding, or hematuria Hematological and Lymphatic: negative Breast: negative Musculoskeletal: negative Remaining ROS negative.  Physical Exam: Blood pressure 144/63, pulse 69,  temperature 99 F (37.2 C), temperature source Oral, resp. rate 18, height 5' 5.5" (1.664 m), weight 170 lb 6.4 oz (77.293 kg). ECOG: 1 General appearance: alert Head: Normocephalic, without obvious abnormality, atraumatic Neck: no adenopathy, no carotid bruit, no JVD, supple, symmetrical, trachea midline and thyroid not enlarged, symmetric, no tenderness/mass/nodules Lymph nodes: Cervical, supraclavicular, and axillary nodes normal. Heart:regular rate and rhythm, S1, S2 normal, no murmur, click, rub or gallop Lung:chest clear, no wheezing, rales, normal symmetric air entry Abdomen: soft, non-tender, without masses or organomegaly EXT:no erythema, induration, or nodules   Lab Results: Lab Results  Component Value Date   WBC 4.6 12/22/2011   HGB 12.0* 12/22/2011   HCT 37.5* 12/22/2011   MCV 95.3 12/22/2011   PLT 102* 12/22/2011    Results for Butikofer, Reeder (MRN [id number]) as of 12/22/2011 17:02  Ref. Range 09/08/2011 13:40 10/07/2011 14:30 11/15/2011 12:26  PSA Latest Range: <=4.00 ng/mL 90.76 (H) 24.86 (H) 11.07 (H)     Impression and Plan: This is a pleasant 78 year old gentleman with the following issues: 1. Castration-resistant prostate cancer.  He is status post Provenge immunotherapy. Currently on Zytiga and he is tolerating this well. He is asymptomatic from his prostate cancer at this time. Recommend that he continue this for now. PSA responded nicely and is down from 171 to 11. PSA is pending today. 2. Androgen deprivation: I will check his testosterone level and give him Lupron as needed. His last testosterone indicating a castrate level we will consider androgen deprivation retreatment in the future. 3. Diabetes: He reports elevated BS in the am. His blood sugars are beter. 4. Insomnia: May be related to Prednisone. Hopefully this will improve with decrease of Prednisone to once a day. 5. Follow-up: In 4-5 weeks.  [facility] 9/12/20135:01 PM

## 2011-12-23 ENCOUNTER — Telehealth: Payer: Self-pay | Admitting: *Deleted

## 2011-12-23 NOTE — Telephone Encounter (Signed)
Called patient and left VM with PSA results drawn 12/22/2011.

## 2011-12-26 ENCOUNTER — Other Ambulatory Visit: Payer: Self-pay | Admitting: Nephrology

## 2012-01-03 ENCOUNTER — Encounter: Payer: Self-pay | Admitting: *Deleted

## 2012-01-03 NOTE — Progress Notes (Signed)
RECEIVED A FAX FROM [facility] CONCERNING A CONFIRMATION OF PRESCRIPTION SHIPMENT FOR ZYTIGA ON 01/02/12.

## 2012-01-13 ENCOUNTER — Other Ambulatory Visit: Payer: Self-pay | Admitting: Oncology

## 2012-01-26 ENCOUNTER — Other Ambulatory Visit (HOSPITAL_BASED_OUTPATIENT_CLINIC_OR_DEPARTMENT_OTHER): Payer: Medicare Other | Admitting: Lab

## 2012-01-26 ENCOUNTER — Ambulatory Visit (HOSPITAL_BASED_OUTPATIENT_CLINIC_OR_DEPARTMENT_OTHER): Payer: Medicare Other | Admitting: Oncology

## 2012-01-26 ENCOUNTER — Telehealth: Payer: Self-pay | Admitting: Oncology

## 2012-01-26 ENCOUNTER — Encounter: Payer: Self-pay | Admitting: Oncology

## 2012-01-26 VITALS — BP 160/70 | HR 73 | Temp 98.9°F | Resp 20 | Ht 65.5 in | Wt 169.7 lb

## 2012-01-26 DIAGNOSIS — E876 Hypokalemia: Secondary | ICD-10-CM

## 2012-01-26 DIAGNOSIS — G4700 Insomnia, unspecified: Secondary | ICD-10-CM

## 2012-01-26 DIAGNOSIS — C61 Malignant neoplasm of prostate: Secondary | ICD-10-CM

## 2012-01-26 DIAGNOSIS — E291 Testicular hypofunction: Secondary | ICD-10-CM

## 2012-01-26 LAB — CBC WITH DIFFERENTIAL/PLATELET
BASO%: 0.6 % (ref 0.0–2.0)
LYMPH%: 12.8 % — ABNORMAL LOW (ref 14.0–49.0)
MCHC: 32.3 g/dL (ref 32.0–36.0)
MCV: 93.7 fL (ref 79.3–98.0)
MONO#: 0.2 10*3/uL (ref 0.1–0.9)
MONO%: 5 % (ref 0.0–14.0)
Platelets: 110 10*3/uL — ABNORMAL LOW (ref 140–400)
RBC: 4.12 10*6/uL — ABNORMAL LOW (ref 4.20–5.82)
RDW: 13.9 % (ref 11.0–14.6)
WBC: 4.8 10*3/uL (ref 4.0–10.3)

## 2012-01-26 LAB — COMPREHENSIVE METABOLIC PANEL (CC13)
ALT: 63 U/L — ABNORMAL HIGH (ref 0–55)
AST: 43 U/L — ABNORMAL HIGH (ref 5–34)
Alkaline Phosphatase: 52 U/L (ref 40–150)
Sodium: 140 mEq/L (ref 136–145)
Total Bilirubin: 1.6 mg/dL — ABNORMAL HIGH (ref 0.20–1.20)
Total Protein: 6.6 g/dL (ref 6.4–8.3)

## 2012-01-26 LAB — PSA: PSA: 1.24 ng/mL (ref <=4.00)

## 2012-01-26 MED ORDER — POTASSIUM CHLORIDE CRYS ER 20 MEQ PO TBCR: EXTENDED_RELEASE_TABLET | ORAL | Status: DC

## 2012-01-26 NOTE — Telephone Encounter (Signed)
appts made and printed for pt aom °

## 2012-01-26 NOTE — Progress Notes (Signed)
Hematology and Oncology Follow Up Visit  Shane [name] [id number] May 25, 1926 77 y.o. 01/26/2012 4:03 PM  CC: Shane [name], M.D.  Shane [name], M.D.    Principle Diagnosis:This is an 78 year old gentleman with prostate cancer diagnosed in 72.  He currently has metastatic disease that is asymptomatic.  Prior Therapy: 1. Status post prostatectomy and lymphadenectomy.  He had involvement of the seminal vesicles. 2. Patient treated with adjuvant radiation therapy. 3. Patient developed recurrent disease treated with Lupron and subsequently with Lupron and Casodex and Casodex withdrawal. 4. Patient treated with second-line hormonal manipulation, including ketoconazole, prednisone and subsequently had relapse with PSA up to 47. 5. Received Provenge immunotherapy.  He had treated with 3 Provenge infusions, the last of which was given on 12/17/2010.    Current therapy: Currently on hormonal deprivation 30 mg as needed if his testosterone is more than 20.  Zytiga 1000 mg daily beginning in April 2013.  He is also on Prednisone 5 mg daily..  Interim History:  Shane [name] presents today for a followup visit.  He started on Zytiga in April 2013 and overall has tolerated this well. No abdominal pain, nausea, or vomiting. He has continued to be asymptomatic from his disease.  He is not reporting any symptoms of chest pain or difficulty breathing.  He had not reported any back pain.  Did not report any genitourinary complaints.  Performance status and activity level remain excellent at this time.  No recent hospitalizations or illnesses. No bone pain and actually his energy is better. He reports that his blood sugars are better since decreasing his prednisone dose.    Medications: I have reviewed the patient's current medications. Current outpatient prescriptions:abiraterone Acetate (ZYTIGA) 250 MG tablet, Take 4 tablets (1,000 mg total) by mouth daily. Take on an empty stomach 1 hour before or 2  hours after a meal, Disp: 120 tablet, Rfl: 1;  amLODipine (NORVASC) 5 MG tablet, Take 5 mg by mouth daily., Disp: , Rfl: ;  aspirin 81 MG tablet, Take 81 mg by mouth daily., Disp: , Rfl: ;  atorvastatin (LIPITOR) 20 MG tablet, Take 20 mg by mouth daily., Disp: , Rfl:  cholecalciferol (VITAMIN D) 400 UNITS TABS, Take 400 Units by mouth daily., Disp: , Rfl: ;  gemfibrozil (LOPID) 600 MG tablet, Take 600 mg by mouth 2 (two) times daily before a meal., Disp: , Rfl: ;  glipiZIDE (GLUCOTROL) 5 MG tablet, Take 5 mg by mouth 2 (two) times daily before a meal., Disp: , Rfl: ;  GuanFACINE HCl (TENEX PO), Take by mouth., Disp: , Rfl:  losartan-hydrochlorothiazide (HYZAAR) 100-25 MG per tablet, Take 1 tablet by mouth daily., Disp: , Rfl: ;  metoCLOPramide (REGLAN) 10 MG tablet, Take 10 mg by mouth 4 (four) times daily., Disp: , Rfl: ;  omeprazole (PRILOSEC) 20 MG capsule, Take 20 mg by mouth daily., Disp: , Rfl: ;  potassium chloride SA (K-DUR,KLOR-CON) 20 MEQ tablet, Take 1 tablet daily for 3 days, Disp: 3 tablet, Rfl: 0 predniSONE (DELTASONE) 5 MG tablet, Take 5 mg by mouth daily. , Disp: , Rfl: ;  predniSONE (DELTASONE) 5 MG tablet, TAKE 1 TABLET (5 MG TOTAL) BY MOUTH 2 (TWO) TIMES DAILY., Disp: 120 tablet, Rfl: 0;  sulindac (CLINORIL) 200 MG tablet, Take 200 mg by mouth 2 (two) times daily., Disp: , Rfl: ;  vitamin C (ASCORBIC ACID) 500 MG tablet, Take 500 mg by mouth daily., Disp: , Rfl:   Allergies: No Known Allergies  Past Medical History, Surgical  history, Social history, and Family History were reviewed and updated.  Review of Systems: Constitutional:  Negative for fever, chills, night sweats, anorexia, weight loss, pain. Cardiovascular: no chest pain or dyspnea on exertion Respiratory: no cough, shortness of breath, or wheezing Neurological: no TIA or stroke symptoms Dermatological: negative ENT: negative Skin: Negative. Gastrointestinal: no abdominal pain, change in bowel habits, or black or bloody  stools Genito-Urinary: no dysuria, trouble voiding, or hematuria Hematological and Lymphatic: negative Breast: negative Musculoskeletal: negative Remaining ROS negative.  Physical Exam: Blood pressure 160/70, pulse 73, temperature 98.9 F (37.2 C), temperature source Oral, resp. rate 20, height 5' 5.5" (1.664 m), weight 169 lb 11.2 oz (76.975 kg). ECOG: 1 General appearance: alert Head: Normocephalic, without obvious abnormality, atraumatic Neck: no adenopathy, no carotid bruit, no JVD, supple, symmetrical, trachea midline and thyroid not enlarged, symmetric, no tenderness/mass/nodules Lymph nodes: Cervical, supraclavicular, and axillary nodes normal. Heart:regular rate and rhythm, S1, S2 normal, no murmur, click, rub or gallop Lung:chest clear, no wheezing, rales, normal symmetric air entry Abdomen: soft, non-tender, without masses or organomegaly EXT:no erythema, induration, or nodules   Lab Results: Lab Results  Component Value Date   WBC 4.8 01/26/2012   HGB 12.5* 01/26/2012   HCT 38.6 01/26/2012   MCV 93.7 01/26/2012   PLT 110* 01/26/2012   Results for Shawley, Alasdair (MRN [id number]) as of 01/26/2012 16:03  Ref. Range 07/06/2011 08:55 09/08/2011 13:40 10/07/2011 14:30 11/15/2011 12:26 12/22/2011 15:37  PSA Latest Range: <=4.00 ng/mL 171.70 (H) 90.76 (H) 24.86 (H) 11.07 (H) 2.27    Impression and Plan: This is a pleasant 78 year old gentleman with the following issues: 1. Castration-resistant prostate cancer.  He is status post Provenge immunotherapy. Currently on Zytiga and he is tolerating this well. He is asymptomatic from his prostate cancer at this time. Recommend that he continue this for now. PSA responded nicely and is down from 171 to 2. PSA is pending today. 2. Androgen deprivation: I will check his testosterone level and give him Lupron as needed. His last testosterone in 11/2011 indicated a castrate level we will consider androgen deprivation retreatment in the  future. 3. Diabetes: He reports elevated BS in the am. His blood sugars are better. He will follow-up with PCP for further management. 4. Insomnia: May be related to Prednisone. Improved with reduction or Prednisone to once a day. 5. Hypokalemia: This is mild. Discussed diet with him. He is worried about BS rising with diet modifications. I have prescribed a short course of K-Dur. He will follow-up with PCP for further management of this. 6. Follow-up: In 4-5 weeks.  Clenton Pare 10/17/20134:03 PM

## 2012-01-26 NOTE — Patient Instructions (Addendum)
I have sent a prescription for potassium to your pharmacy. Take 1 tablet daily for 3 days only.  Constipation:  Use Miralax 1 capful mixed in water daily. You may added a stool softener such as Colace (docusate sodium) twice a day if the Miralax is not working for you.   Results for Shane [name], Shane [name] (MRN [id number]) as of 01/26/2012 13:57  Ref. Range 07/06/2011 08:55 09/08/2011 13:40 10/07/2011 14:30 11/15/2011 12:26 12/22/2011 15:37  PSA Latest Range: <=4.00 ng/mL 171.70 (H) 90.76 (H) 24.86 (H) 11.07 (H) 2.27

## 2012-02-01 ENCOUNTER — Telehealth: Payer: Self-pay | Admitting: *Deleted

## 2012-02-01 NOTE — Telephone Encounter (Signed)
[facility] faxed confirmation of prescription shipment.  Shane [name] was shipped 01-31-2012 with next business day delivery.

## 2012-03-02 ENCOUNTER — Other Ambulatory Visit (HOSPITAL_BASED_OUTPATIENT_CLINIC_OR_DEPARTMENT_OTHER): Payer: Medicare Other | Admitting: Lab

## 2012-03-02 ENCOUNTER — Telehealth: Payer: Self-pay | Admitting: Oncology

## 2012-03-02 ENCOUNTER — Ambulatory Visit (HOSPITAL_BASED_OUTPATIENT_CLINIC_OR_DEPARTMENT_OTHER): Payer: Medicare Other | Admitting: Oncology

## 2012-03-02 VITALS — BP 142/69 | HR 86 | Temp 97.5°F | Resp 20 | Ht 65.5 in | Wt 171.8 lb

## 2012-03-02 DIAGNOSIS — G4700 Insomnia, unspecified: Secondary | ICD-10-CM

## 2012-03-02 DIAGNOSIS — C61 Malignant neoplasm of prostate: Secondary | ICD-10-CM

## 2012-03-02 DIAGNOSIS — C7982 Secondary malignant neoplasm of genital organs: Secondary | ICD-10-CM

## 2012-03-02 DIAGNOSIS — E876 Hypokalemia: Secondary | ICD-10-CM

## 2012-03-02 LAB — COMPREHENSIVE METABOLIC PANEL (CC13)
ALT: 84 U/L — ABNORMAL HIGH (ref 0–55)
AST: 40 U/L — ABNORMAL HIGH (ref 5–34)
CO2: 28 mEq/L (ref 22–29)
Calcium: 9.7 mg/dL (ref 8.4–10.4)
Chloride: 107 mEq/L (ref 98–107)
Potassium: 3.9 mEq/L (ref 3.5–5.1)
Sodium: 140 mEq/L (ref 136–145)
Total Protein: 6.5 g/dL (ref 6.4–8.3)

## 2012-03-02 LAB — CBC WITH DIFFERENTIAL/PLATELET
BASO%: 0.5 % (ref 0.0–2.0)
EOS%: 0.7 % (ref 0.0–7.0)
HCT: 36.8 % — ABNORMAL LOW (ref 38.4–49.9)
MCHC: 32.6 g/dL (ref 32.0–36.0)
MONO#: 0.3 10*3/uL (ref 0.1–0.9)
RBC: 3.9 10*6/uL — ABNORMAL LOW (ref 4.20–5.82)
RDW: 14.4 % (ref 11.0–14.6)
WBC: 4.2 10*3/uL (ref 4.0–10.3)
lymph#: 0.6 10*3/uL — ABNORMAL LOW (ref 0.9–3.3)

## 2012-03-02 LAB — PSA: PSA: 0.72 ng/mL (ref <=4.00)

## 2012-03-02 NOTE — Telephone Encounter (Signed)
gv and printed appt schedule to pt for DEC.. °

## 2012-03-02 NOTE — Progress Notes (Signed)
Hematology and Oncology Follow Up Visit  Shane [name] [id number] [birth year] 78 y.o. 03/02/2012 1:28 PM  CC: [name], M.D.  [name], M.D.    Principle Diagnosis:This is an 78 year old gentleman with prostate cancer diagnosed in 72.  He currently has metastatic disease that is asymptomatic.  Prior Therapy: 1. Status post prostatectomy and lymphadenectomy.  He had involvement of the seminal vesicles. 2. Patient treated with adjuvant radiation therapy. 3. Patient developed recurrent disease treated with Lupron and subsequently with Lupron and Casodex and Casodex withdrawal. 4. Patient treated with second-line hormonal manipulation, including ketoconazole, prednisone and subsequently had relapse with PSA up to 47. 5. Received Provenge immunotherapy.  He had treated with 3 Provenge infusions, the last of which was given on 12/17/2010.    Current therapy: Currently on hormonal deprivation 30 mg as needed if his testosterone is more than 20.  Zytiga 1000 mg daily beginning in April 2013.  He is also on Prednisone 5 mg daily..  Interim History:  Shane [name] presents today for a followup visit. He started on Zytiga in April 2013 and overall has tolerated this well. No abdominal pain, nausea, or vomiting. He has continued to be asymptomatic from his disease.  He is not reporting any symptoms of chest pain or difficulty breathing.  He had not reported any back pain.  Did not report any genitourinary complaints.  Performance status and activity level remain excellent at this time.  No recent hospitalizations or illnesses. No bone pain and actually his energy is better. He is reporting some constipations issues despite once day senokot and miralax.   Medications: I have reviewed the patient's current medications. Current outpatient prescriptions:abiraterone Acetate (ZYTIGA) 250 MG tablet, Take 4 tablets (1,000 mg total) by mouth daily. Take on an empty stomach 1 hour before or 2  hours after a meal, Disp: 120 tablet, Rfl: 1;  amLODipine (NORVASC) 5 MG tablet, Take 5 mg by mouth daily., Disp: , Rfl: ;  aspirin 81 MG tablet, Take 81 mg by mouth daily., Disp: , Rfl: ;  atorvastatin (LIPITOR) 20 MG tablet, Take 20 mg by mouth daily., Disp: , Rfl:  cholecalciferol (VITAMIN D) 400 UNITS TABS, Take 400 Units by mouth daily., Disp: , Rfl: ;  gemfibrozil (LOPID) 600 MG tablet, Take 600 mg by mouth 2 (two) times daily before a meal., Disp: , Rfl: ;  glipiZIDE (GLUCOTROL) 5 MG tablet, Take 5 mg by mouth 2 (two) times daily before a meal., Disp: , Rfl: ;  GuanFACINE HCl (TENEX PO), Take by mouth., Disp: , Rfl:  losartan-hydrochlorothiazide (HYZAAR) 100-25 MG per tablet, Take 1 tablet by mouth daily., Disp: , Rfl: ;  metoCLOPramide (REGLAN) 10 MG tablet, Take 10 mg by mouth 4 (four) times daily., Disp: , Rfl: ;  omeprazole (PRILOSEC) 20 MG capsule, Take 20 mg by mouth daily., Disp: , Rfl: ;  polyethylene glycol (MIRALAX / GLYCOLAX) packet, Take 17 g by mouth daily., Disp: , Rfl:  predniSONE (DELTASONE) 5 MG tablet, Take 5 mg by mouth daily. , Disp: , Rfl: ;  senna (SENOKOT) 8.6 MG tablet, Take 1 tablet by mouth daily., Disp: , Rfl: ;  sulindac (CLINORIL) 200 MG tablet, Take 200 mg by mouth 2 (two) times daily., Disp: , Rfl: ;  vitamin C (ASCORBIC ACID) 500 MG tablet, Take 500 mg by mouth daily., Disp: , Rfl: ;  potassium chloride SA (K-DUR,KLOR-CON) 20 MEQ tablet, Take 1 tablet daily for 3 days, Disp: 3 tablet, Rfl: 0  Allergies: No  Known Allergies  Past Medical History, Surgical history, Social history, and Family History were reviewed and updated.  Review of Systems: Constitutional:  Negative for fever, chills, night sweats, anorexia, weight loss, pain. Cardiovascular: no chest pain or dyspnea on exertion Respiratory: no cough, shortness of breath, or wheezing Neurological: no TIA or stroke symptoms Dermatological: negative ENT: negative Skin: Negative. Gastrointestinal: no abdominal  pain, change in bowel habits, or black or bloody stools Genito-Urinary: no dysuria, trouble voiding, or hematuria Hematological and Lymphatic: negative Breast: negative Musculoskeletal: negative Remaining ROS negative.  Physical Exam: Blood pressure 142/69, pulse 86, temperature 97.5 F (36.4 C), temperature source Oral, resp. rate 20, height 5' 5.5" (1.664 m), weight 171 lb 12.8 oz (77.928 kg). ECOG: 1 General appearance: alert Head: Normocephalic, without obvious abnormality, atraumatic Neck: no adenopathy, no carotid bruit, no JVD, supple, symmetrical, trachea midline and thyroid not enlarged, symmetric, no tenderness/mass/nodules Lymph nodes: Cervical, supraclavicular, and axillary nodes normal. Heart:regular rate and rhythm, S1, S2 normal, no murmur, click, rub or gallop Lung:chest clear, no wheezing, rales, normal symmetric air entry Abdomen: soft, non-tender, without masses or organomegaly EXT:no erythema, induration, or nodules   Lab Results: Lab Results  Component Value Date   WBC 4.2 03/02/2012   HGB 12.0* 03/02/2012   HCT 36.8* 03/02/2012   MCV 94.3 03/02/2012   PLT 108* 03/02/2012   Results for Handyside, Shane [name] (MRN [id number]) as of 03/02/2012 13:12  Ref. Range 12/22/2011 15:37 01/26/2012 12:32  PSA Latest Range: <=4.00 ng/mL 2.27 1.24    Impression and Plan: This is a pleasant 78 year old gentleman with the following issues: 1. Castration-resistant prostate cancer.  He is status post Provenge immunotherapy. Currently on Zytiga and he is tolerating this well. He is asymptomatic from his prostate cancer at this time. Recommend that he continue this for now. PSA responded nicely and is down from 171 to 1.24. PSA is pending today. 2. Androgen deprivation: His last testosterone in 11/2011 indicated a castrate level we will consider androgen deprivation retreatment in the future. 3. Diabetes: He reports elevated BS in the am. His blood sugars are better. He will follow-up  with PCP for further management. 4. Insomnia: May be related to Prednisone. Improved with reduction or Prednisone to once a day. 5. Hypokalemia: we are checking K level today 6. Constipation: I have asked him to increase his senokot to bid and use magnesium citrate as needed if he does not have a bowl movement in 3 days.  7. Follow-up: In 4-5 weeks.  Ozetta Flatley 11/22/20131:28 PM

## 2012-03-05 NOTE — Progress Notes (Signed)
Received office notes from Dr. Su Grand @ [facility]; forwarded to Dr. Clelia Croft

## 2012-03-06 ENCOUNTER — Telehealth: Payer: Self-pay

## 2012-03-06 ENCOUNTER — Encounter: Payer: Self-pay | Admitting: *Deleted

## 2012-03-06 NOTE — Progress Notes (Signed)
RECEIVED A FAX FROM [facility] CONCERNING A CONFIRMATION OF PRESCRIPTION SHIPMENT FOR ZYTIGA ON 03/05/12.

## 2012-03-06 NOTE — Telephone Encounter (Signed)
Message copied by Kallie Locks on Tue Mar 06, 2012  9:37 AM ------      Message from: Benjiman Core      Created: Mon Mar 05, 2012  8:52 AM       Please call his PSA. It is excellent.

## 2012-04-10 ENCOUNTER — Other Ambulatory Visit (HOSPITAL_BASED_OUTPATIENT_CLINIC_OR_DEPARTMENT_OTHER): Payer: Medicare Other

## 2012-04-10 ENCOUNTER — Ambulatory Visit (HOSPITAL_BASED_OUTPATIENT_CLINIC_OR_DEPARTMENT_OTHER): Payer: Medicare Other | Admitting: Oncology

## 2012-04-10 ENCOUNTER — Encounter: Payer: Self-pay | Admitting: Oncology

## 2012-04-10 ENCOUNTER — Telehealth: Payer: Self-pay | Admitting: Oncology

## 2012-04-10 VITALS — BP 153/52 | HR 51 | Temp 98.9°F | Resp 20 | Ht 65.5 in | Wt 167.8 lb

## 2012-04-10 DIAGNOSIS — E291 Testicular hypofunction: Secondary | ICD-10-CM

## 2012-04-10 DIAGNOSIS — C61 Malignant neoplasm of prostate: Secondary | ICD-10-CM

## 2012-04-10 DIAGNOSIS — K5900 Constipation, unspecified: Secondary | ICD-10-CM

## 2012-04-10 DIAGNOSIS — E876 Hypokalemia: Secondary | ICD-10-CM

## 2012-04-10 LAB — CBC WITH DIFFERENTIAL/PLATELET
BASO%: 0.6 % (ref 0.0–2.0)
LYMPH%: 19.2 % (ref 14.0–49.0)
MCH: 31.2 pg (ref 27.2–33.4)
MCHC: 33.2 g/dL (ref 32.0–36.0)
MCV: 93.9 fL (ref 79.3–98.0)
MONO%: 8.8 % (ref 0.0–14.0)
Platelets: 112 10*3/uL — ABNORMAL LOW (ref 140–400)
RBC: 4.01 10*6/uL — ABNORMAL LOW (ref 4.20–5.82)

## 2012-04-10 LAB — COMPREHENSIVE METABOLIC PANEL (CC13)
ALT: 60 U/L — ABNORMAL HIGH (ref 0–55)
Alkaline Phosphatase: 53 U/L (ref 40–150)
Creatinine: 1.4 mg/dL — ABNORMAL HIGH (ref 0.7–1.3)
Sodium: 139 mEq/L (ref 136–145)
Total Bilirubin: 1.51 mg/dL — ABNORMAL HIGH (ref 0.20–1.20)
Total Protein: 6.6 g/dL (ref 6.4–8.3)

## 2012-04-10 MED ORDER — SENNA-DOCUSATE SODIUM 8.6-50 MG PO TABS: 2.0000 | ORAL_TABLET | Freq: Two times a day (BID) | ORAL | Status: DC

## 2012-04-10 MED ORDER — LACTULOSE 20 GM/30ML PO SOLN: 30.0000 mL | Freq: Two times a day (BID) | ORAL | Status: DC | PRN

## 2012-04-10 NOTE — Progress Notes (Signed)
Hematology and Oncology Follow Up Visit  Shane [name] [id number] 04-Oct-1926 78 y.o. 04/10/2012 4:13 PM  CC: [name], M.D.  [name], M.D.    Principle Diagnosis:This is an 78 year old gentleman with prostate cancer diagnosed in 44.  He currently has metastatic disease that is asymptomatic.  Prior Therapy: 1. Status post prostatectomy and lymphadenectomy.  He had involvement of the seminal vesicles. 2. Patient treated with adjuvant radiation therapy. 3. Patient developed recurrent disease treated with Lupron and subsequently with Lupron and Casodex and Casodex withdrawal. 4. Patient treated with second-line hormonal manipulation, including ketoconazole, prednisone and subsequently had relapse with PSA up to 47. 5. Received Provenge immunotherapy.  He had treated with 3 Provenge infusions, the last of which was given on 12/17/2010.    Current therapy: Currently on hormonal deprivation 30 mg as needed if his testosterone is more than 20.  Zytiga 1000 mg daily beginning in April 2013.  He is also on Prednisone 5 mg daily..  Interim History:  Shane [name] presents today for a followup visit. He started on Zytiga in April 2013 and overall has tolerated this well. No abdominal pain, nausea, or vomiting. He has continued to be asymptomatic from his disease.  He is not reporting any symptoms of chest pain or difficulty breathing.  He had not reported any back pain.  Did not report any genitourinary complaints.  Performance status and activity level remain excellent at this time.  No recent hospitalizations or illnesses. No bone pain and actually his energy is better. Continues to have constipation despite Senokot. Using Magnesium citrate 2 times per week.  Medications: I have reviewed the patient's current medications. Current outpatient prescriptions:cholecalciferol (D-VI-SOL) 400 UNIT/ML LIQD, Take 400 Units by mouth daily., Disp: , Rfl: ;  abiraterone Acetate (ZYTIGA) 250 MG  tablet, Take 4 tablets (1,000 mg total) by mouth daily. Take on an empty stomach 1 hour before or 2 hours after a meal, Disp: 120 tablet, Rfl: 1;  amLODipine (NORVASC) 5 MG tablet, Take 5 mg by mouth daily., Disp: , Rfl: ;  aspirin 81 MG tablet, Take 81 mg by mouth daily., Disp: , Rfl:  atorvastatin (LIPITOR) 20 MG tablet, Take 20 mg by mouth daily., Disp: , Rfl: ;  gemfibrozil (LOPID) 600 MG tablet, Take 600 mg by mouth 2 (two) times daily before a meal., Disp: , Rfl: ;  glipiZIDE (GLUCOTROL) 5 MG tablet, Take 5 mg by mouth 2 (two) times daily before a meal., Disp: , Rfl: ;  GuanFACINE HCl (TENEX PO), Take by mouth., Disp: , Rfl:  Lactulose 20 GM/30ML SOLN, Take 30 mLs (20 g total) by mouth 2 (two) times daily as needed (For constipation)., Disp: 500 mL, Rfl: 2;  losartan-hydrochlorothiazide (HYZAAR) 100-25 MG per tablet, Take 1 tablet by mouth daily., Disp: , Rfl: ;  metoCLOPramide (REGLAN) 10 MG tablet, Take 10 mg by mouth 4 (four) times daily., Disp: , Rfl: ;  omeprazole (PRILOSEC) 20 MG capsule, Take 20 mg by mouth daily., Disp: , Rfl:  predniSONE (DELTASONE) 5 MG tablet, Take 5 mg by mouth daily. , Disp: , Rfl: ;  sennosides-docusate sodium (SENOKOT-S) 8.6-50 MG tablet, Take 2 tablets by mouth 2 (two) times daily., Disp: 120 tablet, Rfl: 2;  sulindac (CLINORIL) 200 MG tablet, Take 200 mg by mouth 2 (two) times daily., Disp: , Rfl: ;  vitamin C (ASCORBIC ACID) 500 MG tablet, Take 500 mg by mouth daily., Disp: , Rfl:   Allergies: No Known Allergies  Past Medical History, Surgical  history, Social history, and Family History were reviewed and updated.  Review of Systems: Constitutional:  Negative for fever, chills, night sweats, anorexia, weight loss, pain. Cardiovascular: no chest pain or dyspnea on exertion Respiratory: no cough, shortness of breath, or wheezing Neurological: no TIA or stroke symptoms Dermatological: negative ENT: negative Skin: Negative. Gastrointestinal: no abdominal pain,  change in bowel habits, or black or bloody stools Genito-Urinary: no dysuria, trouble voiding, or hematuria Hematological and Lymphatic: negative Breast: negative Musculoskeletal: negative Remaining ROS negative.  Physical Exam: Blood pressure 153/52, pulse 51, temperature 98.9 F (37.2 C), temperature source Oral, resp. rate 20, height 5' 5.5" (1.664 m), weight 167 lb 12.8 oz (76.114 kg). ECOG: 1 General appearance: alert Head: Normocephalic, without obvious abnormality, atraumatic Neck: no adenopathy, no carotid bruit, no JVD, supple, symmetrical, trachea midline and thyroid not enlarged, symmetric, no tenderness/mass/nodules Lymph nodes: Cervical, supraclavicular, and axillary nodes normal. Heart:regular rate and rhythm, S1, S2 normal, no murmur, click, rub or gallop Lung:chest clear, no wheezing, rales, normal symmetric air entry Abdomen: soft, non-tender, without masses or organomegaly EXT:no erythema, induration, or nodules   Lab Results: Lab Results  Component Value Date   WBC 4.6 04/10/2012   HGB 12.5* 04/10/2012   HCT 37.6* 04/10/2012   MCV 93.9 04/10/2012   PLT 112* 04/10/2012   Results for Shane [name], Shane [name] (MRN [id number]) as of 04/10/2012 12:15  Ref. Range 12/22/2011 15:37 01/26/2012 12:32 03/02/2012 13:00  PSA Latest Range: <=4.00 ng/mL 2.27 1.24 0.72    Impression and Plan: This is a pleasant 78 year old gentleman with the following issues: 1. Castration-resistant prostate cancer.  He is status post Provenge immunotherapy. Currently on Zytiga and he is tolerating this well. He is asymptomatic from his prostate cancer at this time. Recommend that he continue this for now. PSA responded nicely and is down from 171 to 0.72. PSA is pending today. 2. Androgen deprivation: His last testosterone in 11/2011 indicated a castrate level we will consider androgen deprivation retreatment in the future. 3. Diabetes: He reports elevated BS in the am. His blood sugars are better. He  will follow-up with PCP for further management. 4. Insomnia: May be related to Prednisone. Improved with reduction or Prednisone to once a day. 5. Hypokalemia: K+ is normal today. 6. Constipation: Change Senokot to Senokot S. He may use 2 tabs twice a day. Lactulose given PRN. 7. Follow-up: In 4-5 weeks.  Shane [name] 12/31/20134:13 PM

## 2012-04-10 NOTE — Patient Instructions (Addendum)
Results for Shane [name], Shane [name] (MRN [id number]) as of 04/10/2012 13:18  Ref. Range 10/07/2011 14:30 11/15/2011 12:26 12/22/2011 15:37 01/26/2012 12:32 03/02/2012 13:00  PSA Latest Range: <=4.00 ng/mL 24.86 (H) 11.07 (H) 2.27 1.24 0.72    Constipation:  Take Senokot S 2 tabs twice a day.  You may use Lactulose 30 ml twice a day as needed if Senokot is not working.

## 2012-04-10 NOTE — Telephone Encounter (Signed)
gv and printed appt schedule to pt for jan 2014

## 2012-05-02 ENCOUNTER — Telehealth: Payer: Self-pay | Admitting: Oncology

## 2012-05-02 NOTE — Telephone Encounter (Signed)
s.w. pt and advised on 2.12.14...Marland Kitchenpt ok and aware

## 2012-05-08 ENCOUNTER — Encounter: Payer: Self-pay | Admitting: *Deleted

## 2012-05-08 NOTE — Progress Notes (Signed)
RECEIVED A FAX FROM [facility] CONCERNING A CONFIRMATION OF PRESCRIPTION SHIPMENT FOR ZYTIGA ON 05/07/12.

## 2012-05-10 ENCOUNTER — Other Ambulatory Visit: Payer: Self-pay | Admitting: Oncology

## 2012-05-11 ENCOUNTER — Ambulatory Visit: Payer: Medicare Other | Admitting: Oncology

## 2012-05-11 ENCOUNTER — Other Ambulatory Visit: Payer: Medicare Other | Admitting: Lab

## 2012-05-23 ENCOUNTER — Ambulatory Visit: Payer: Medicare Other | Admitting: Oncology

## 2012-05-23 ENCOUNTER — Other Ambulatory Visit: Payer: Medicare Other | Admitting: Lab

## 2012-05-23 ENCOUNTER — Other Ambulatory Visit (HOSPITAL_BASED_OUTPATIENT_CLINIC_OR_DEPARTMENT_OTHER): Payer: Medicare Other | Admitting: Lab

## 2012-05-23 ENCOUNTER — Ambulatory Visit (HOSPITAL_BASED_OUTPATIENT_CLINIC_OR_DEPARTMENT_OTHER): Payer: Medicare Other | Admitting: Oncology

## 2012-05-23 ENCOUNTER — Telehealth: Payer: Self-pay | Admitting: Oncology

## 2012-05-23 VITALS — BP 145/62 | HR 57 | Temp 97.1°F | Resp 18 | Ht 65.5 in | Wt 169.6 lb

## 2012-05-23 DIAGNOSIS — G4700 Insomnia, unspecified: Secondary | ICD-10-CM

## 2012-05-23 DIAGNOSIS — E876 Hypokalemia: Secondary | ICD-10-CM

## 2012-05-23 DIAGNOSIS — E119 Type 2 diabetes mellitus without complications: Secondary | ICD-10-CM

## 2012-05-23 DIAGNOSIS — C61 Malignant neoplasm of prostate: Secondary | ICD-10-CM

## 2012-05-23 LAB — CBC WITH DIFFERENTIAL/PLATELET
BASO%: 0.4 % (ref 0.0–2.0)
EOS%: 0.9 % (ref 0.0–7.0)
MCH: 30.8 pg (ref 27.2–33.4)
MCV: 93.6 fL (ref 79.3–98.0)
MONO%: 6.9 % (ref 0.0–14.0)
RBC: 4.09 10*6/uL — ABNORMAL LOW (ref 4.20–5.82)
RDW: 13.8 % (ref 11.0–14.6)

## 2012-05-23 LAB — COMPREHENSIVE METABOLIC PANEL (CC13)
AST: 44 U/L — ABNORMAL HIGH (ref 5–34)
Albumin: 3.4 g/dL — ABNORMAL LOW (ref 3.5–5.0)
Alkaline Phosphatase: 75 U/L (ref 40–150)
Potassium: 3.7 mEq/L (ref 3.5–5.1)
Sodium: 142 mEq/L (ref 136–145)
Total Protein: 7.1 g/dL (ref 6.4–8.3)

## 2012-05-23 NOTE — Addendum Note (Signed)
Addended by: Benjiman Core on: 05/23/2012 11:35 AM   Modules accepted: Orders

## 2012-05-23 NOTE — Telephone Encounter (Signed)
gv and printed appt schedule for March and Feb

## 2012-05-23 NOTE — Progress Notes (Signed)
Hematology and Oncology Follow Up Visit  Shane [name] [id number] [birth year] 79 y.o. 05/23/2012 11:15 AM  CC: [name], M.D.  [name], M.D.    Principle Diagnosis:This is an 79 year old gentleman with prostate cancer diagnosed in 31.  He currently has metastatic disease that is asymptomatic.  Prior Therapy: 1. Status post prostatectomy and lymphadenectomy.  He had involvement of the seminal vesicles. 2. Patient treated with adjuvant radiation therapy. 3. Patient developed recurrent disease treated with Lupron and subsequently with Lupron and Casodex and Casodex withdrawal. 4. Patient treated with second-line hormonal manipulation, including ketoconazole, prednisone and subsequently had relapse with PSA up to 47. 5. Received Provenge immunotherapy.  He had treated with 3 Provenge infusions, the last of which was given on 12/17/2010.    Current therapy: Currently on hormonal deprivation 30 mg as needed if his testosterone is more than 20.  Zytiga 1000 mg daily beginning in April 2013.  He is also on Prednisone 5 mg daily..  Interim History:  Shane [name] presents today for a followup visit. He started on Zytiga in April 2013 and overall has tolerated this well. No abdominal pain, nausea, or vomiting. He has continued to be asymptomatic from his disease.  He is not reporting any symptoms of chest pain or difficulty breathing.  He had not reported any back pain.  Did not report any genitourinary complaints.  Performance status and activity level remain excellent at this time.  No recent hospitalizations or illnesses. No bone pain and actually his energy is better. His constipation issues are improving at this time.   Medications: I have reviewed the patient's current medications. Current outpatient prescriptions:abiraterone Acetate (ZYTIGA) 250 MG tablet, Take 4 tablets (1,000 mg total) by mouth daily. Take on an empty stomach 1 hour before or 2 hours after a meal, Disp: 120  tablet, Rfl: 1;  amLODipine (NORVASC) 5 MG tablet, Take 5 mg by mouth daily., Disp: , Rfl: ;  aspirin 81 MG tablet, Take 81 mg by mouth daily., Disp: , Rfl: ;  atorvastatin (LIPITOR) 20 MG tablet, Take 20 mg by mouth daily., Disp: , Rfl:  cholecalciferol (D-VI-SOL) 400 UNIT/ML LIQD, Take 400 Units by mouth daily., Disp: , Rfl: ;  gemfibrozil (LOPID) 600 MG tablet, Take 600 mg by mouth 2 (two) times daily before a meal., Disp: , Rfl: ;  glipiZIDE (GLUCOTROL) 5 MG tablet, Take 5 mg by mouth 2 (two) times daily before a meal., Disp: , Rfl: ;  GuanFACINE HCl (TENEX PO), Take by mouth., Disp: , Rfl:  Lactulose 20 GM/30ML SOLN, Take 30 mLs (20 g total) by mouth 2 (two) times daily as needed (For constipation)., Disp: 500 mL, Rfl: 2;  losartan-hydrochlorothiazide (HYZAAR) 100-25 MG per tablet, Take 1 tablet by mouth daily., Disp: , Rfl: ;  metoCLOPramide (REGLAN) 10 MG tablet, Take 10 mg by mouth 4 (four) times daily., Disp: , Rfl: ;  omeprazole (PRILOSEC) 20 MG capsule, Take 20 mg by mouth daily., Disp: , Rfl:  predniSONE (DELTASONE) 5 MG tablet, Take 5 mg by mouth daily. , Disp: , Rfl: ;  predniSONE (DELTASONE) 5 MG tablet, TAKE 1 TABLET TWICE DAILY, Disp: 120 tablet, Rfl: 0;  sennosides-docusate sodium (SENOKOT-S) 8.6-50 MG tablet, Take 2 tablets by mouth 2 (two) times daily., Disp: 120 tablet, Rfl: 2;  sulindac (CLINORIL) 200 MG tablet, Take 200 mg by mouth 2 (two) times daily., Disp: , Rfl:  vitamin C (ASCORBIC ACID) 500 MG tablet, Take 500 mg by mouth daily., Disp: , Rfl:  Allergies: No Known Allergies  Past Medical History, Surgical history, Social history, and Family History were reviewed and updated.  Review of Systems: Constitutional:  Negative for fever, chills, night sweats, anorexia, weight loss, pain. Cardiovascular: no chest pain or dyspnea on exertion Respiratory: no cough, shortness of breath, or wheezing Neurological: no TIA or stroke symptoms Dermatological: negative ENT: negative Skin:  Negative. Gastrointestinal: no abdominal pain, change in bowel habits, or black or bloody stools Genito-Urinary: no dysuria, trouble voiding, or hematuria Hematological and Lymphatic: negative Breast: negative Musculoskeletal: negative Remaining ROS negative.  Physical Exam: Blood pressure 145/62, pulse 57, temperature 97.1 F (36.2 C), temperature source Oral, resp. rate 18, height 5' 5.5" (1.664 m), weight 169 lb 9.6 oz (76.93 kg). ECOG: 1 General appearance: alert Head: Normocephalic, without obvious abnormality, atraumatic Neck: no adenopathy, no carotid bruit, no JVD, supple, symmetrical, trachea midline and thyroid not enlarged, symmetric, no tenderness/mass/nodules Lymph nodes: Cervical, supraclavicular, and axillary nodes normal. Heart:regular rate and rhythm, S1, S2 normal, no murmur, click, rub or gallop Lung:chest clear, no wheezing, rales, normal symmetric air entry Abdomen: soft, non-tender, without masses or organomegaly EXT:no erythema, induration, or nodules   Lab Results: Lab Results  Component Value Date   WBC 5.1 05/23/2012   HGB 12.6* 05/23/2012   HCT 38.3* 05/23/2012   MCV 93.6 05/23/2012   PLT 106* 05/23/2012   Results for Shane [name], Shane [name] (MRN [id number]) as of 05/23/2012 11:16  Ref. Range 12/22/2011 15:37 01/26/2012 12:32 03/02/2012 13:00 04/10/2012 11:46  PSA Latest Range: <=4.00 ng/mL 2.27 1.24 0.72 0.31     Impression and Plan: This is a pleasant 79 year old gentleman with the following issues: 1. Castration-resistant prostate cancer.  He is status post Provenge immunotherapy. Currently on Zytiga and he is tolerating this well. He is asymptomatic from his prostate cancer at this time. Recommend that he continue this for now. PSA responded nicely and is down from 171 to 0.31. PSA is pending today. 2. Androgen deprivation: His last testosterone in 11/2011 indicated a castrate level we will consider androgen deprivation retreatment in the future. I will repeat  his testosterone in 06/2012.  3. Diabetes: He reports elevated BS in the am. His blood sugars are better. He will follow-up with PCP for further management. 4. Insomnia: May be related to Prednisone. Improved with reduction or Prednisone to once a day. 5. Hypokalemia: K+ is pending today. 6. Constipation: doing better from this stand point. 7. Follow-up: In 5 weeks.  Shane [name] 2/12/201411:15 AM

## 2012-06-01 ENCOUNTER — Other Ambulatory Visit: Payer: Self-pay | Admitting: *Deleted

## 2012-06-01 DIAGNOSIS — C61 Malignant neoplasm of prostate: Secondary | ICD-10-CM

## 2012-06-01 MED ORDER — ABIRATERONE ACETATE 250 MG PO TABS: 1000.0000 mg | ORAL_TABLET | Freq: Every day | ORAL | Status: DC

## 2012-06-01 NOTE — Telephone Encounter (Signed)
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 

## 2012-06-01 NOTE — Addendum Note (Signed)
Addended by: Arvilla Meres on: 06/01/2012 10:47 AM   Modules accepted: Orders

## 2012-06-05 NOTE — Telephone Encounter (Signed)
RECEIVED A FAX FROM [facility] CONCERNING A CONFIRMATION OF PRESCRIPTION SHIPMENT FOR ZYTIGA ON 06/04/12.

## 2012-06-27 ENCOUNTER — Telehealth: Payer: Self-pay | Admitting: Oncology

## 2012-06-27 ENCOUNTER — Other Ambulatory Visit (HOSPITAL_BASED_OUTPATIENT_CLINIC_OR_DEPARTMENT_OTHER): Payer: Medicare Other | Admitting: Lab

## 2012-06-27 ENCOUNTER — Ambulatory Visit (HOSPITAL_BASED_OUTPATIENT_CLINIC_OR_DEPARTMENT_OTHER): Payer: Medicare Other | Admitting: Oncology

## 2012-06-27 VITALS — BP 142/68 | HR 69 | Temp 98.1°F | Resp 18 | Ht 65.5 in | Wt 170.5 lb

## 2012-06-27 DIAGNOSIS — C61 Malignant neoplasm of prostate: Secondary | ICD-10-CM

## 2012-06-27 DIAGNOSIS — E291 Testicular hypofunction: Secondary | ICD-10-CM

## 2012-06-27 LAB — CBC WITH DIFFERENTIAL/PLATELET
Basophils Absolute: 0 10*3/uL (ref 0.0–0.1)
EOS%: 1.2 % (ref 0.0–7.0)
HGB: 12.3 g/dL — ABNORMAL LOW (ref 13.0–17.1)
LYMPH%: 18.3 % (ref 14.0–49.0)
MCH: 30.8 pg (ref 27.2–33.4)
NEUT#: 3.6 10*3/uL (ref 1.5–6.5)
NEUT%: 71.5 % (ref 39.0–75.0)
Platelets: 119 10*3/uL — ABNORMAL LOW (ref 140–400)
RDW: 14.1 % (ref 11.0–14.6)
WBC: 5 10*3/uL (ref 4.0–10.3)
lymph#: 0.9 10*3/uL (ref 0.9–3.3)

## 2012-06-27 LAB — COMPREHENSIVE METABOLIC PANEL (CC13)
Albumin: 3.4 g/dL — ABNORMAL LOW (ref 3.5–5.0)
Alkaline Phosphatase: 67 U/L (ref 40–150)
BUN: 18.9 mg/dL (ref 7.0–26.0)
Glucose: 127 mg/dl — ABNORMAL HIGH (ref 70–99)
Potassium: 3.4 mEq/L — ABNORMAL LOW (ref 3.5–5.1)
Total Bilirubin: 1.17 mg/dL (ref 0.20–1.20)

## 2012-06-27 LAB — PSA: PSA: 0.18 ng/mL (ref <=4.00)

## 2012-06-27 LAB — TESTOSTERONE: Testosterone: <10 ng/dL — ABNORMAL LOW (ref 300–890)

## 2012-06-27 NOTE — Progress Notes (Signed)
Hematology and Oncology Follow Up Visit  Shane [name] [id number] 13-Apr-1926 79 y.o. 06/27/2012 4:21 PM  CC: [name], M.D.  [name], M.D.    Principle Diagnosis:This is an 79 year old gentleman with prostate cancer diagnosed in 100.  He currently has metastatic disease that is asymptomatic.  Prior Therapy: 1. Status post prostatectomy and lymphadenectomy.  He had involvement of the seminal vesicles. 2. Patient treated with adjuvant radiation therapy. 3. Patient developed recurrent disease treated with Lupron and subsequently with Lupron and Casodex and Casodex withdrawal. 4. Patient treated with second-line hormonal manipulation, including ketoconazole, prednisone and subsequently had relapse with PSA up to 47. 5. Received Provenge immunotherapy.  He had treated with 3 Provenge infusions, the last of which was given on 12/17/2010.    Current therapy: Currently on hormonal deprivation 30 mg as needed if his testosterone is more than 20.  Zytiga 1000 mg daily beginning in April 2013.  He is also on Prednisone 5 mg daily..  Interim History:  Mr. Ferrando presents today for a followup visit. He started on Zytiga in April 2013 and overall has tolerated this well. No abdominal pain, nausea, or vomiting. He has continued to be asymptomatic from his disease.  He is not reporting any symptoms of chest pain or difficulty breathing.  He had not reported any back pain.  Did not report any genitourinary complaints.  Performance status and activity level remain excellent at this time.  No recent hospitalizations or illnesses. No bone pain and actually his energy is better. His constipation issues are improving at this time.   Medications: I have reviewed the patient's current medications. Current outpatient prescriptions:abiraterone Acetate (ZYTIGA) 250 MG tablet, Take 4 tablets (1,000 mg total) by mouth daily. Take on an empty stomach 1 hour before or 2 hours after a meal, Disp: 120  tablet, Rfl: 1;  amLODipine (NORVASC) 5 MG tablet, Take 5 mg by mouth daily., Disp: , Rfl: ;  aspirin 81 MG tablet, Take 81 mg by mouth daily., Disp: , Rfl: ;  atorvastatin (LIPITOR) 20 MG tablet, Take 20 mg by mouth daily., Disp: , Rfl:  cholecalciferol (D-VI-SOL) 400 UNIT/ML LIQD, Take 400 Units by mouth daily., Disp: , Rfl: ;  gemfibrozil (LOPID) 600 MG tablet, Take 600 mg by mouth 2 (two) times daily before a meal., Disp: , Rfl: ;  glipiZIDE (GLUCOTROL) 5 MG tablet, Take 5 mg by mouth 2 (two) times daily before a meal., Disp: , Rfl: ;  GuanFACINE HCl (TENEX PO), Take by mouth., Disp: , Rfl:  Lactulose 20 GM/30ML SOLN, Take 30 mLs (20 g total) by mouth 2 (two) times daily as needed (For constipation)., Disp: 500 mL, Rfl: 2;  losartan-hydrochlorothiazide (HYZAAR) 100-25 MG per tablet, Take 1 tablet by mouth daily., Disp: , Rfl: ;  metoCLOPramide (REGLAN) 10 MG tablet, Take 10 mg by mouth 4 (four) times daily., Disp: , Rfl: ;  omeprazole (PRILOSEC) 20 MG capsule, Take 20 mg by mouth daily., Disp: , Rfl:  predniSONE (DELTASONE) 5 MG tablet, Take 5 mg by mouth daily. , Disp: , Rfl: ;  sennosides-docusate sodium (SENOKOT-S) 8.6-50 MG tablet, Take 2 tablets by mouth 2 (two) times daily., Disp: 120 tablet, Rfl: 2;  sulindac (CLINORIL) 200 MG tablet, Take 200 mg by mouth 2 (two) times daily., Disp: , Rfl: ;  vitamin C (ASCORBIC ACID) 500 MG tablet, Take 500 mg by mouth daily., Disp: , Rfl:   Allergies: No Known Allergies  Past Medical History, Surgical history, Social history, and  Family History were reviewed and updated.  Review of Systems: Constitutional:  Negative for fever, chills, night sweats, anorexia, weight loss, pain. Cardiovascular: no chest pain or dyspnea on exertion Respiratory: no cough, shortness of breath, or wheezing Neurological: no TIA or stroke symptoms Dermatological: negative ENT: negative Skin: Negative. Gastrointestinal: no abdominal pain, change in bowel habits, or black or  bloody stools Genito-Urinary: no dysuria, trouble voiding, or hematuria Hematological and Lymphatic: negative Breast: negative Musculoskeletal: negative Remaining ROS negative.  Physical Exam: Blood pressure 142/68, pulse 69, temperature 98.1 F (36.7 C), temperature source Oral, resp. rate 18, height 5' 5.5" (1.664 m), weight 170 lb 8 oz (77.338 kg). ECOG: 1 General appearance: alert Head: Normocephalic, without obvious abnormality, atraumatic Neck: no adenopathy, no carotid bruit, no JVD, supple, symmetrical, trachea midline and thyroid not enlarged, symmetric, no tenderness/mass/nodules Lymph nodes: Cervical, supraclavicular, and axillary nodes normal. Heart:regular rate and rhythm, S1, S2 normal, no murmur, click, rub or gallop Lung:chest clear, no wheezing, rales, normal symmetric air entry Abdomen: soft, non-tender, without masses or organomegaly EXT:no erythema, induration, or nodules   Lab Results: Lab Results  Component Value Date   WBC 5.0 06/27/2012   HGB 12.3* 06/27/2012   HCT 37.1* 06/27/2012   MCV 92.7 06/27/2012   PLT 119* 06/27/2012   Results for SADLER, TESCHNER (MRN [id number]) as of 06/27/2012 20:51  Ref. Range 01/26/2012 12:32 03/02/2012 13:00 04/10/2012 11:46 05/23/2012 10:45  PSA Latest Range: <=4.00 ng/mL 1.24 0.72 0.31 0.26     Impression and Plan: This is a pleasant 79 year old gentleman with the following issues: 1. Castration-resistant prostate cancer.  He is status post Provenge immunotherapy. Currently on Zytiga and he is tolerating this well. He is asymptomatic from his prostate cancer at this time. Recommend that he continue this for now. PSA responded nicely and is down from 171 to 0.26. PSA is pending today. 2. Androgen deprivation: His last testosterone in 11/2011 indicated a castrate level we will consider androgen deprivation retreatment in the future. Testosterone level is pending today.  3. Diabetes: He reports normal BS. He will follow-up with PCP  for further management. 4. Insomnia: May be related to Prednisone. Improved with reduction or Prednisone to once a day. 5. Hypokalemia: K+ is pending today. 6. Constipation: doing better from this stand point. 7. Follow-up: In 5 weeks.  Clenton Pare 3/19/20144:21 PM

## 2012-06-27 NOTE — Patient Instructions (Signed)
Results for TEVITA, GOMER (MRN [id number]) as of 06/27/2012 13:57  Ref. Range 10/07/2011 14:30 11/15/2011 12:26 12/22/2011 15:37 01/26/2012 12:32 03/02/2012 13:00 04/10/2012 11:46 05/23/2012 10:45  PSA Latest Range: <=4.00 ng/mL 24.86 (H) 11.07 (H) 2.27 1.24 0.72 0.31 0.26

## 2012-06-27 NOTE — Telephone Encounter (Signed)
gve the pt his April 2014 appt calendar

## 2012-06-28 ENCOUNTER — Telehealth: Payer: Self-pay | Admitting: *Deleted

## 2012-06-28 NOTE — Telephone Encounter (Signed)
Gave patient results of PSA done yesterday. 

## 2012-06-28 NOTE — Telephone Encounter (Signed)
Message copied by Reesa Chew on Thu Jun 28, 2012 11:59 AM ------      Message from: Clenton Pare R      Created: Thu Jun 28, 2012  9:16 AM      Regarding: PSA       Call pt with PSA. Down to 0.18. ------

## 2012-07-05 ENCOUNTER — Encounter: Payer: Self-pay | Admitting: *Deleted

## 2012-07-05 NOTE — Progress Notes (Signed)
RECEIVED A FAX FROM [facility] CONCERNING A CONFIRMATION OF PRESCRIPTION SHIPMENT FOR ZYTIGA ON 07/04/12.

## 2012-07-06 ENCOUNTER — Telehealth: Payer: Self-pay | Admitting: Oncology

## 2012-07-10 ENCOUNTER — Other Ambulatory Visit: Payer: Self-pay | Admitting: *Deleted

## 2012-07-11 ENCOUNTER — Telehealth: Payer: Self-pay | Admitting: Oncology

## 2012-07-11 NOTE — Telephone Encounter (Signed)
r/s appt per pof pt aware

## 2012-07-30 ENCOUNTER — Other Ambulatory Visit: Payer: Self-pay | Admitting: Oncology

## 2012-08-03 ENCOUNTER — Other Ambulatory Visit: Payer: Medicare Other | Admitting: Lab

## 2012-08-03 ENCOUNTER — Ambulatory Visit: Payer: Medicare Other | Admitting: Oncology

## 2012-08-08 ENCOUNTER — Other Ambulatory Visit (HOSPITAL_BASED_OUTPATIENT_CLINIC_OR_DEPARTMENT_OTHER): Payer: Medicare Other | Admitting: Lab

## 2012-08-08 ENCOUNTER — Telehealth: Payer: Self-pay | Admitting: Oncology

## 2012-08-08 ENCOUNTER — Ambulatory Visit (HOSPITAL_BASED_OUTPATIENT_CLINIC_OR_DEPARTMENT_OTHER): Payer: Medicare Other | Admitting: Oncology

## 2012-08-08 VITALS — BP 163/63 | HR 73 | Temp 98.3°F | Resp 20 | Ht 65.5 in | Wt 168.5 lb

## 2012-08-08 DIAGNOSIS — E119 Type 2 diabetes mellitus without complications: Secondary | ICD-10-CM

## 2012-08-08 DIAGNOSIS — E876 Hypokalemia: Secondary | ICD-10-CM

## 2012-08-08 DIAGNOSIS — C61 Malignant neoplasm of prostate: Secondary | ICD-10-CM

## 2012-08-08 DIAGNOSIS — G4700 Insomnia, unspecified: Secondary | ICD-10-CM

## 2012-08-08 LAB — CBC WITH DIFFERENTIAL/PLATELET
Basophils Absolute: 0 10*3/uL (ref 0.0–0.1)
Eosinophils Absolute: 0.1 10*3/uL (ref 0.0–0.5)
HGB: 12.2 g/dL — ABNORMAL LOW (ref 13.0–17.1)
MCV: 94.1 fL (ref 79.3–98.0)
MONO%: 6.7 % (ref 0.0–14.0)
NEUT#: 3.6 10*3/uL (ref 1.5–6.5)
RDW: 14.1 % (ref 11.0–14.6)
lymph#: 0.9 10*3/uL (ref 0.9–3.3)

## 2012-08-08 LAB — COMPREHENSIVE METABOLIC PANEL (CC13)
Albumin: 3.3 g/dL — ABNORMAL LOW (ref 3.5–5.0)
BUN: 16.6 mg/dL (ref 7.0–26.0)
CO2: 24 mEq/L (ref 22–29)
Calcium: 8.8 mg/dL (ref 8.4–10.4)
Chloride: 107 mEq/L (ref 98–107)
Glucose: 202 mg/dl — ABNORMAL HIGH (ref 70–99)
Potassium: 3.6 mEq/L (ref 3.5–5.1)

## 2012-08-08 LAB — PSA: PSA: 0.1 ng/mL (ref <=4.00)

## 2012-08-08 NOTE — Progress Notes (Signed)
Hematology and Oncology Follow Up Visit  Shane [name] [id number] March 28, 1927 79 y.o. 08/08/2012 8:36 AM  CC: [name], M.D.  [name], M.D.    Principle Diagnosis:This is an 79 year old gentleman with prostate cancer diagnosed in 47.  He currently has metastatic disease that is asymptomatic.  Prior Therapy: 1. Status post prostatectomy and lymphadenectomy.  He had involvement of the seminal vesicles. 2. Patient treated with adjuvant radiation therapy. 3. Patient developed recurrent disease treated with Lupron and subsequently with Lupron and Casodex and Casodex withdrawal. 4. Patient treated with second-line hormonal manipulation, including ketoconazole, prednisone and subsequently had relapse with PSA up to 47. 5. Received Provenge immunotherapy.  He had treated with 3 Provenge infusions, the last of which was given on 12/17/2010.    Current therapy: Currently on hormonal deprivation 30 mg as needed if his testosterone is more than 20.  Zytiga 1000 mg daily beginning in April 2013.  He is also on Prednisone 5 mg daily..  Interim History:  Shane [name] presents today for a followup visit. He started on Zytiga in April 2013 and overall has tolerated this well. No abdominal pain, nausea, or vomiting. He has continued to be asymptomatic from his disease.  He is not reporting any symptoms of chest pain or difficulty breathing.  He had not reported any back pain.  Did not report any genitourinary complaints.  Performance status and activity level remain excellent at this time.  No recent hospitalizations or illnesses. No bone pain and actually his energy is better. No recent illness or hospitalization.   Medications: I have reviewed the patient's current medications. Current outpatient prescriptions:amLODipine (NORVASC) 5 MG tablet, Take 5 mg by mouth daily., Disp: , Rfl: ;  aspirin 81 MG tablet, Take 81 mg by mouth daily., Disp: , Rfl: ;  atorvastatin (LIPITOR) 20 MG tablet,  Take 20 mg by mouth daily., Disp: , Rfl: ;  cholecalciferol (D-VI-SOL) 400 UNIT/ML LIQD, Take 400 Units by mouth daily., Disp: , Rfl:  gemfibrozil (LOPID) 600 MG tablet, Take 600 mg by mouth 2 (two) times daily before a meal., Disp: , Rfl: ;  glipiZIDE (GLUCOTROL) 5 MG tablet, Take 5 mg by mouth 2 (two) times daily before a meal., Disp: , Rfl: ;  GuanFACINE HCl (TENEX PO), Take by mouth., Disp: , Rfl: ;  Lactulose 20 GM/30ML SOLN, Take 30 mLs (20 g total) by mouth 2 (two) times daily as needed (For constipation)., Disp: 500 mL, Rfl: 2 losartan-hydrochlorothiazide (HYZAAR) 100-25 MG per tablet, Take 1 tablet by mouth daily., Disp: , Rfl: ;  metoCLOPramide (REGLAN) 10 MG tablet, Take 10 mg by mouth 4 (four) times daily., Disp: , Rfl: ;  omeprazole (PRILOSEC) 20 MG capsule, Take 20 mg by mouth daily., Disp: , Rfl: ;  predniSONE (DELTASONE) 5 MG tablet, Take 5 mg by mouth daily. , Disp: , Rfl:  sennosides-docusate sodium (SENOKOT-S) 8.6-50 MG tablet, Take 2 tablets by mouth 2 (two) times daily., Disp: 120 tablet, Rfl: 2;  sulindac (CLINORIL) 200 MG tablet, Take 200 mg by mouth 2 (two) times daily., Disp: , Rfl: ;  vitamin C (ASCORBIC ACID) 500 MG tablet, Take 500 mg by mouth daily., Disp: , Rfl:  ZYTIGA 250 MG tablet, Take 4 tablets (1000mg ) by mouth daily.  Take on empty stomach 1 hour before or 2 hours after a meal., Disp: 120 tablet, Rfl: 0  Allergies: No Known Allergies  Past Medical History, Surgical history, Social history, and Family History were reviewed and updated.  Review  of Systems: Constitutional:  Negative for fever, chills, night sweats, anorexia, weight loss, pain. Cardiovascular: no chest pain or dyspnea on exertion Respiratory: no cough, shortness of breath, or wheezing Neurological: no TIA or stroke symptoms Dermatological: negative ENT: negative Skin: Negative. Gastrointestinal: no abdominal pain, change in bowel habits, or black or bloody stools Genito-Urinary: no dysuria, trouble  voiding, or hematuria Hematological and Lymphatic: negative Breast: negative Musculoskeletal: negative Remaining ROS negative.  Physical Exam: Blood pressure 163/63, pulse 73, temperature 98.3 F (36.8 C), temperature source Oral, resp. rate 20, height 5' 5.5" (1.664 m), weight 168 lb 8 oz (76.431 kg). ECOG: 1 General appearance: alert Head: Normocephalic, without obvious abnormality, atraumatic Neck: no adenopathy, no carotid bruit, no JVD, supple, symmetrical, trachea midline and thyroid not enlarged, symmetric, no tenderness/mass/nodules Lymph nodes: Cervical, supraclavicular, and axillary nodes normal. Heart:regular rate and rhythm, S1, S2 normal, no murmur, click, rub or gallop Lung:chest clear, no wheezing, rales, normal symmetric air entry Abdomen: soft, non-tender, without masses or organomegaly EXT:no erythema, induration, or nodules   Lab Results: Lab Results  Component Value Date   WBC 4.9 08/08/2012   HGB 12.2* 08/08/2012   HCT 37.6* 08/08/2012   MCV 94.1 08/08/2012   PLT 109* 08/08/2012     Results for Shane [name], Shane [name] (MRN [id number]) as of 08/08/2012 08:37  Ref. Range 04/10/2012 11:46 05/23/2012 10:45 06/27/2012 11:21  PSA Latest Range: <=4.00 ng/mL 0.31 0.26 0.18    Impression and Plan: This is a pleasant 79 year old gentleman with the following issues: 1. Castration-resistant prostate cancer.  He is status post Provenge immunotherapy. Currently on Zytiga and he is tolerating this well. His PSA is continuing to decline at this time. Recommend that he continue this for now. PSA is pending today. 2. Androgen deprivation: His last testosterone from 06/2012 indicated a castrate level we will consider androgen deprivation retreatment in the future.  3. Diabetes: He reports normal BS. He will follow-up with PCP for further management. 4. Insomnia: May be related to Prednisone. Improved with reduction or Prednisone to once a day. 5. Hypokalemia: K+ is pending  today. 6. Constipation: doing better from this stand point. 7. Follow-up: In 6 weeks.  Shane [name] 4/30/20148:36 AM

## 2012-08-08 NOTE — Telephone Encounter (Signed)
gv and printed appt sched and avs for pt for June... °

## 2012-08-09 ENCOUNTER — Telehealth: Payer: Self-pay | Admitting: *Deleted

## 2012-08-09 NOTE — Telephone Encounter (Signed)
Gave patient results of PSA drawn yesterday

## 2012-08-09 NOTE — Telephone Encounter (Signed)
Message copied by Reesa Chew on Thu Aug 09, 2012 10:30 AM ------      Message from: Clenton Pare R      Created: Thu Aug 09, 2012  8:40 AM       Call pt with PSA result. ------

## 2012-08-18 ENCOUNTER — Other Ambulatory Visit: Payer: Self-pay | Admitting: Oncology

## 2012-08-22 ENCOUNTER — Other Ambulatory Visit: Payer: Self-pay | Admitting: *Deleted

## 2012-08-22 MED ORDER — SENNA-DOCUSATE SODIUM 8.6-50 MG PO TABS: 2.0000 | ORAL_TABLET | Freq: Two times a day (BID) | ORAL | Status: DC

## 2012-08-22 MED ORDER — LACTULOSE 20 GM/30ML PO SOLN: 30.0000 mL | Freq: Two times a day (BID) | ORAL | Status: DC | PRN

## 2012-08-31 ENCOUNTER — Other Ambulatory Visit: Payer: Self-pay | Admitting: *Deleted

## 2012-08-31 MED ORDER — ABIRATERONE ACETATE 250 MG PO TABS: ORAL_TABLET | ORAL | Status: DC

## 2012-09-05 ENCOUNTER — Other Ambulatory Visit: Payer: Self-pay | Admitting: *Deleted

## 2012-09-05 DIAGNOSIS — C61 Malignant neoplasm of prostate: Secondary | ICD-10-CM

## 2012-09-05 MED ORDER — ABIRATERONE ACETATE 250 MG PO TABS: ORAL_TABLET | ORAL | Status: DC

## 2012-09-05 NOTE — Telephone Encounter (Signed)
Refill request for Zytiga to MD desk for approval.

## 2012-09-07 NOTE — Telephone Encounter (Signed)
RECEIVED A FAX FROM [facility] CONCERNING A CONFIRMATION OF PRESCRIPTION SHIPMENT FOR ZYTIGA ON 09/06/12. 

## 2012-09-17 ENCOUNTER — Telehealth: Payer: Self-pay | Admitting: Oncology

## 2012-09-17 ENCOUNTER — Other Ambulatory Visit (HOSPITAL_BASED_OUTPATIENT_CLINIC_OR_DEPARTMENT_OTHER): Payer: Medicare Other | Admitting: Lab

## 2012-09-17 ENCOUNTER — Encounter: Payer: Self-pay | Admitting: Oncology

## 2012-09-17 ENCOUNTER — Ambulatory Visit (HOSPITAL_BASED_OUTPATIENT_CLINIC_OR_DEPARTMENT_OTHER): Payer: Medicare Other | Admitting: Oncology

## 2012-09-17 VITALS — BP 155/46 | HR 47 | Temp 97.9°F | Resp 18 | Ht 65.0 in | Wt 166.7 lb

## 2012-09-17 DIAGNOSIS — C61 Malignant neoplasm of prostate: Secondary | ICD-10-CM

## 2012-09-17 LAB — COMPREHENSIVE METABOLIC PANEL (CC13)
Alkaline Phosphatase: 60 U/L (ref 40–150)
BUN: 14.4 mg/dL (ref 7.0–26.0)
Creatinine: 1.5 mg/dL — ABNORMAL HIGH (ref 0.7–1.3)
Glucose: 207 mg/dl — ABNORMAL HIGH (ref 70–99)
Total Bilirubin: 1.08 mg/dL (ref 0.20–1.20)

## 2012-09-17 LAB — CBC WITH DIFFERENTIAL/PLATELET
Basophils Absolute: 0 10*3/uL (ref 0.0–0.1)
Eosinophils Absolute: 0.1 10*3/uL (ref 0.0–0.5)
HGB: 12.5 g/dL — ABNORMAL LOW (ref 13.0–17.1)
LYMPH%: 12.5 % — ABNORMAL LOW (ref 14.0–49.0)
MCH: 30.9 pg (ref 27.2–33.4)
MCV: 92.4 fL (ref 79.3–98.0)
MONO%: 6.2 % (ref 0.0–14.0)
NEUT#: 5.2 10*3/uL (ref 1.5–6.5)
Platelets: 92 10*3/uL — ABNORMAL LOW (ref 140–400)

## 2012-09-17 MED ORDER — PREDNISONE 5 MG PO TABS: 5.0000 mg | ORAL_TABLET | Freq: Every day | ORAL | Status: DC

## 2012-09-17 MED ORDER — ERYTHROMYCIN 5 MG/GM OP OINT: TOPICAL_OINTMENT | Freq: Four times a day (QID) | OPHTHALMIC | Status: DC

## 2012-09-17 NOTE — Progress Notes (Signed)
Hematology and Oncology Follow Up Visit  Shane [name] [id number] 25-Jul-1926 79 y.o. 09/17/2012 9:03 AM  CC: Shane [name], M.D.  Shane [name], M.D.    Principle Diagnosis:This is an 79 year old gentleman with prostate cancer diagnosed in 89.  He currently has metastatic disease that is asymptomatic.  Prior Therapy: 1. Status post prostatectomy and lymphadenectomy.  He had involvement of the seminal vesicles. 2. Patient treated with adjuvant radiation therapy. 3. Patient developed recurrent disease treated with Lupron and subsequently with Lupron and Casodex and Casodex withdrawal. 4. Patient treated with second-line hormonal manipulation, including ketoconazole, prednisone and subsequently had relapse with PSA up to 47. 5. Received Provenge immunotherapy.  He had treated with 3 Provenge infusions, the last of which was given on 12/17/2010.    Current therapy: Currently on hormonal deprivation 30 mg as needed if his testosterone is more than 20.  Zytiga 1000 mg daily beginning in April 2013.  He is also on Prednisone 5 mg daily..  Interim History:  Shane [name] presents today for a followup visit. He started on Zytiga in April 2013 and overall has tolerated this well. No abdominal pain, nausea, or vomiting. He has continued to be asymptomatic from his disease.  He is not reporting any symptoms of chest pain or difficulty breathing.  He had not reported any back pain.  Did not report any genitourinary complaints.  Performance status and activity level remain excellent at this time.  No recent hospitalizations or illnesses. No bone pain and actually his energy is better. No recent illness or hospitalization. Reports that he woke up with left eye redness and drainage. Eye feels like there is sandpaper in it.   Medications: I have reviewed the patient's current medications. Current outpatient prescriptions:abiraterone Acetate (ZYTIGA) 250 MG tablet, Take 4 tablets (1000mg ) by mouth daily.   Take on empty stomach 1 hour before or 2 hours after a meal., Disp: 120 tablet, Rfl: 0;  amLODipine (NORVASC) 5 MG tablet, Take 5 mg by mouth daily., Disp: , Rfl: ;  aspirin 81 MG tablet, Take 81 mg by mouth daily., Disp: , Rfl: ;  atorvastatin (LIPITOR) 20 MG tablet, Take 20 mg by mouth daily., Disp: , Rfl:  cholecalciferol (D-VI-SOL) 400 UNIT/ML LIQD, Take 400 Units by mouth daily., Disp: , Rfl: ;  erythromycin ophthalmic ointment, Place into the left eye 4 (four) times daily. Apply 1/2 inch to left eye 4 times a day for 5 days., Disp: 3.5 g, Rfl: 0;  gemfibrozil (LOPID) 600 MG tablet, Take 600 mg by mouth 2 (two) times daily before a meal., Disp: , Rfl:  glipiZIDE (GLUCOTROL) 5 MG tablet, Take 5 mg by mouth 2 (two) times daily before a meal., Disp: , Rfl: ;  GuanFACINE HCl (TENEX PO), Take by mouth., Disp: , Rfl: ;  Lactulose 20 GM/30ML SOLN, Take 30 mLs (20 g total) by mouth 2 (two) times daily as needed (For constipation)., Disp: 500 mL, Rfl: 2;  losartan-hydrochlorothiazide (HYZAAR) 100-25 MG per tablet, Take 1 tablet by mouth daily., Disp: , Rfl:  metoCLOPramide (REGLAN) 10 MG tablet, Take 10 mg by mouth 4 (four) times daily., Disp: , Rfl: ;  omeprazole (PRILOSEC) 20 MG capsule, Take 20 mg by mouth daily., Disp: , Rfl: ;  predniSONE (DELTASONE) 5 MG tablet, Take 1 tablet (5 mg total) by mouth daily., Disp: 30 tablet, Rfl: 2;  sennosides-docusate sodium (SENOKOT-S) 8.6-50 MG tablet, Take 2 tablets by mouth 2 (two) times daily., Disp: 120 tablet, Rfl: 2 sulindac (CLINORIL) 200  MG tablet, Take 200 mg by mouth 2 (two) times daily., Disp: , Rfl: ;  vitamin C (ASCORBIC ACID) 500 MG tablet, Take 500 mg by mouth daily., Disp: , Rfl:   Allergies: No Known Allergies  Past Medical History, Surgical history, Social history, and Family History were reviewed and updated.  Review of Systems: Constitutional:  Negative for fever, chills, night sweats, anorexia, weight loss, pain. Cardiovascular: no chest pain or  dyspnea on exertion Respiratory: no cough, shortness of breath, or wheezing Neurological: no TIA or stroke symptoms Dermatological: negative ENT: negative Skin: Negative. Gastrointestinal: no abdominal pain, change in bowel habits, or black or bloody stools Genito-Urinary: no dysuria, trouble voiding, or hematuria Hematological and Lymphatic: negative Breast: negative Musculoskeletal: negative Remaining ROS negative.  Physical Exam: Blood pressure 155/46, pulse 47, temperature 97.9 F (36.6 C), temperature source Oral, resp. rate 18, height 5\' 5"  (1.651 m), weight 166 lb 11.2 oz (75.615 kg). ECOG: 1 General appearance: alert Head: Normocephalic, without obvious abnormality, atraumatic Neck: no adenopathy, no carotid bruit, no JVD, supple, symmetrical, trachea midline and thyroid not enlarged, symmetric, no tenderness/mass/nodules Lymph nodes: Cervical, supraclavicular, and axillary nodes normal. Heart:regular rate and rhythm, S1, S2 normal, no murmur, click, rub or gallop Lung:chest clear, no wheezing, rales, normal symmetric air entry Abdomen: soft, non-tender, without masses or organomegaly EXT:no erythema, induration, or nodules   Lab Results: Lab Results  Component Value Date   WBC 6.5 09/17/2012   HGB 12.5* 09/17/2012   HCT 37.5* 09/17/2012   MCV 92.4 09/17/2012   PLT 92* 09/17/2012    Results for Shane [name], Shane [name] (MRN [id number]) as of 09/17/2012 08:34  Ref. Range 03/02/2012 13:00 04/10/2012 11:46 05/23/2012 10:45 06/27/2012 11:21 08/08/2012 07:55  PSA Latest Range: <=4.00 ng/mL 0.72 0.31 0.26 0.18 0.10    Impression and Plan: This is a pleasant 79 year old gentleman with the following issues: 1. Castration-resistant prostate cancer.  He is status post Provenge immunotherapy. Currently on Zytiga and he is tolerating this well. His PSA is continuing to decline at this time. Recommend that he continue this for now. PSA is pending today. 2. Androgen deprivation: His last testosterone  from 06/2012 indicated a castrate level we will consider androgen deprivation retreatment in the future.  3. Diabetes: He reports normal BS. He will follow-up with PCP for further management. 4. Insomnia: May be related to Prednisone. Improved with reduction or Prednisone to once a day. 5. Hypokalemia: K+ is pending today. 6. Constipation: doing better from this stand point. 7. Left eye conjunctivitis. I have prescribed Erythromycin eye ointment 4 times a day for 5 days. If his eye worsens, I told him to f/u with PCP or ophthalmology.  8. Follow-up: In 6 weeks.  Clenton Pare 6/9/20149:03 AM

## 2012-09-17 NOTE — Patient Instructions (Addendum)
Results for Shane [name], Shane [name] (MRN [id number]) as of 09/17/2012 08:34  Ref. Range 03/02/2012 13:00 04/10/2012 11:46 05/23/2012 10:45 06/27/2012 11:21 08/08/2012 07:55  PSA Latest Range: <=4.00 ng/mL 0.72 0.31 0.26 0.18 0.10

## 2012-09-20 ENCOUNTER — Telehealth: Payer: Self-pay | Admitting: *Deleted

## 2012-09-20 NOTE — Telephone Encounter (Signed)
sw pt wife informed her that her husband appt time had changed. gv appt d/t for 10/30/12 for labs@ 1:15pm and ov @ 1:45pm. i also made her aware that i will mail a letter/cal since she couldn't find a ink pen...td

## 2012-10-01 ENCOUNTER — Telehealth: Payer: Self-pay | Admitting: *Deleted

## 2012-10-01 NOTE — Telephone Encounter (Signed)
[facility] faxed Zytiga refill request.  Request to provider for review. 

## 2012-10-03 ENCOUNTER — Other Ambulatory Visit: Payer: Self-pay

## 2012-10-03 DIAGNOSIS — C61 Malignant neoplasm of prostate: Secondary | ICD-10-CM

## 2012-10-03 MED ORDER — ABIRATERONE ACETATE 250 MG PO TABS: ORAL_TABLET | ORAL | Status: DC

## 2012-10-04 NOTE — Telephone Encounter (Signed)
[facility] sent facsimile confirmation of Zytiga prescription shipment.  Was shipped 10-01-2012 with next business day delivery.

## 2012-10-26 ENCOUNTER — Other Ambulatory Visit: Payer: Self-pay | Admitting: *Deleted

## 2012-10-26 DIAGNOSIS — C61 Malignant neoplasm of prostate: Secondary | ICD-10-CM

## 2012-10-26 MED ORDER — ABIRATERONE ACETATE 250 MG PO TABS: ORAL_TABLET | ORAL | Status: DC

## 2012-10-26 NOTE — Telephone Encounter (Signed)
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 

## 2012-10-30 ENCOUNTER — Other Ambulatory Visit: Payer: Medicare Other | Admitting: Lab

## 2012-10-30 ENCOUNTER — Ambulatory Visit: Payer: Medicare Other | Admitting: Oncology

## 2012-10-30 ENCOUNTER — Ambulatory Visit (HOSPITAL_BASED_OUTPATIENT_CLINIC_OR_DEPARTMENT_OTHER): Payer: Medicare Other | Admitting: Oncology

## 2012-10-30 ENCOUNTER — Other Ambulatory Visit (HOSPITAL_BASED_OUTPATIENT_CLINIC_OR_DEPARTMENT_OTHER): Payer: Medicare Other | Admitting: Lab

## 2012-10-30 ENCOUNTER — Telehealth: Payer: Self-pay | Admitting: Oncology

## 2012-10-30 VITALS — BP 141/66 | HR 78 | Temp 98.0°F | Resp 20 | Ht 65.0 in | Wt 163.2 lb

## 2012-10-30 DIAGNOSIS — C61 Malignant neoplasm of prostate: Secondary | ICD-10-CM

## 2012-10-30 LAB — CBC WITH DIFFERENTIAL/PLATELET
Basophils Absolute: 0 10*3/uL (ref 0.0–0.1)
Eosinophils Absolute: 0.1 10*3/uL (ref 0.0–0.5)
HCT: 40.1 % (ref 38.4–49.9)
HGB: 12.8 g/dL — ABNORMAL LOW (ref 13.0–17.1)
MCV: 95.1 fL (ref 79.3–98.0)
MONO%: 6.1 % (ref 0.0–14.0)
NEUT#: 4.3 10*3/uL (ref 1.5–6.5)
NEUT%: 76.8 % — ABNORMAL HIGH (ref 39.0–75.0)
RDW: 14.5 % (ref 11.0–14.6)

## 2012-10-30 LAB — COMPREHENSIVE METABOLIC PANEL (CC13)
Albumin: 3.5 g/dL (ref 3.5–5.0)
Alkaline Phosphatase: 52 U/L (ref 40–150)
BUN: 22.5 mg/dL (ref 7.0–26.0)
Calcium: 9.2 mg/dL (ref 8.4–10.4)
Chloride: 104 mEq/L (ref 98–109)
Creatinine: 1.4 mg/dL — ABNORMAL HIGH (ref 0.7–1.3)
Glucose: 205 mg/dl — ABNORMAL HIGH (ref 70–140)
Potassium: 3.9 mEq/L (ref 3.5–5.1)

## 2012-10-30 MED ORDER — PREDNISONE 5 MG PO TABS: 5.0000 mg | ORAL_TABLET | Freq: Every day | ORAL | Status: DC

## 2012-10-30 NOTE — Progress Notes (Signed)
Hematology and Oncology Follow Up Visit  Shane [name] [id number] July 01, 1926 79 y.o. 10/30/2012 1:49 PM  CC: [name], M.D.  [name], M.D.    Principle Diagnosis:This is an 79 year old gentleman with prostate cancer diagnosed in 20.  He currently has metastatic disease that is asymptomatic.  Prior Therapy: 1. Status post prostatectomy and lymphadenectomy.  He had involvement of the seminal vesicles. 2. Patient treated with adjuvant radiation therapy. 3. Patient developed recurrent disease treated with Lupron and subsequently with Lupron and Casodex and Casodex withdrawal. 4. Patient treated with second-line hormonal manipulation, including ketoconazole, prednisone and subsequently had relapse with PSA up to 47. 5. Received Provenge immunotherapy.  He had treated with 3 Provenge infusions, the last of which was given on 12/17/2010.    Current therapy: Currently on hormonal deprivation 30 mg as needed if his testosterone is more than 20.  Zytiga 1000 mg daily beginning in April 2013.  He is also on Prednisone 5 mg daily..  Interim History:  Shane [name] presents today for a followup visit. He started on Zytiga in April 2013 and overall has tolerated this well. No abdominal pain, nausea, or vomiting. He has continued to be asymptomatic from his disease.  He is not reporting any symptoms of chest pain or difficulty breathing. Did not report any genitourinary complaints.  Performance status and activity level remain excellent at this time.  No recent hospitalizations or illnesses. No bone pain and actually his energy is better. No recent illness or hospitalization. He reported some right sided flank pain that started today. Very mild in nature. Not associated with any symptoms and not bad enough to take any medications.    Medications: I have reviewed the patient's current medications.  Current Outpatient Prescriptions  Medication Sig Dispense Refill  . abiraterone Acetate  (ZYTIGA) 250 MG tablet Take 4 tablets (1000mg ) by mouth daily.  Take on empty stomach 1 hour before or 2 hours after a meal.  120 tablet  0  . amLODipine (NORVASC) 5 MG tablet Take 5 mg by mouth daily.      Marland Kitchen aspirin 81 MG tablet Take 81 mg by mouth daily.      Marland Kitchen atorvastatin (LIPITOR) 20 MG tablet Take 20 mg by mouth daily.      . cholecalciferol (D-VI-SOL) 400 UNIT/ML LIQD Take 400 Units by mouth daily.      Marland Kitchen erythromycin ophthalmic ointment Place into the left eye 4 (four) times daily. Apply 1/2 inch to left eye 4 times a day for 5 days.  3.5 g  0  . gemfibrozil (LOPID) 600 MG tablet Take 600 mg by mouth 2 (two) times daily before a meal.      . glipiZIDE (GLUCOTROL) 5 MG tablet Take 5 mg by mouth 2 (two) times daily before a meal.      . Lactulose 20 GM/30ML SOLN Take 30 mLs (20 g total) by mouth 2 (two) times daily as needed (For constipation).  500 mL  2  . losartan-hydrochlorothiazide (HYZAAR) 100-25 MG per tablet Take 1 tablet by mouth daily.      . metoCLOPramide (REGLAN) 10 MG tablet Take 10 mg by mouth 4 (four) times daily.      Marland Kitchen omeprazole (PRILOSEC) 20 MG capsule Take 20 mg by mouth daily.      . predniSONE (DELTASONE) 5 MG tablet Take 1 tablet (5 mg total) by mouth daily.  90 tablet  2  . sennosides-docusate sodium (SENOKOT-S) 8.6-50 MG tablet Take 2 tablets by  mouth 2 (two) times daily.  120 tablet  2  . sulindac (CLINORIL) 200 MG tablet Take 200 mg by mouth 2 (two) times daily.      . vitamin C (ASCORBIC ACID) 500 MG tablet Take 500 mg by mouth daily.       No current facility-administered medications for this visit.     Allergies: No Known Allergies  Past Medical History, Surgical history, Social history, and Family History were reviewed and updated.  Review of Systems: Constitutional:  Negative for fever, chills, night sweats, anorexia, weight loss, pain. Cardiovascular: no chest pain or dyspnea on exertion Respiratory: no cough, shortness of breath, or  wheezing Neurological: no TIA or stroke symptoms Dermatological: negative ENT: negative Skin: Negative. Gastrointestinal: no abdominal pain, change in bowel habits, or black or bloody stools Genito-Urinary: no dysuria, trouble voiding, or hematuria Hematological and Lymphatic: negative Breast: negative Musculoskeletal: negative Remaining ROS negative.  Physical Exam: Blood pressure 141/66, pulse 78, temperature 98 F (36.7 C), temperature source Oral, resp. rate 20, height 5\' 5"  (1.651 m), weight 163 lb 3.2 oz (74.027 kg). ECOG: 1 General appearance: alert Head: Normocephalic, without obvious abnormality, atraumatic Neck: no adenopathy, no carotid bruit, no JVD, supple, symmetrical, trachea midline and thyroid not enlarged, symmetric, no tenderness/mass/nodules Lymph nodes: Cervical, supraclavicular, and axillary nodes normal. Heart:regular rate and rhythm, S1, S2 normal, no murmur, click, rub or gallop Lung:chest clear, no wheezing, rales, normal symmetric air entry Abdomen: soft, non-tender, without masses or organomegaly EXT:no erythema, induration, or nodules Neuro: no deficits noted.   Lab Results: Lab Results  Component Value Date   WBC 5.6 10/30/2012   HGB 12.8* 10/30/2012   HCT 40.1 10/30/2012   MCV 95.1 10/30/2012   PLT 104* 10/30/2012    Results for [name], [name] (MRN [id number]) as of 10/30/2012 13:35  Ref. Range 06/27/2012 11:21 08/08/2012 07:55 09/17/2012 08:01  PSA Latest Range: <=4.00 ng/mL 0.18 0.10 0.09    Impression and Plan: This is a pleasant 79 year old gentleman with the following issues: 1. Castration-resistant prostate cancer.  He is status post Provenge immunotherapy. Currently on Zytiga and he is tolerating this well. His PSA is continuing to decline at this time. Recommend that he continue this for now. PSA is pending today. 2. Androgen deprivation: His last testosterone from 06/2012 indicated a castrate level we will consider androgen deprivation  retreatment in the future.  3. Diabetes: He reports normal BS. He will follow-up with PCP for further management. 4. Right side flan pain: likely musculoskeletal in nature. If it persists, we will restage him with a bone scan and CT scan.   5. Hypokalemia: K+ is pending today. 6. Constipation: doing better from this stand point. 7. Follow-up: In 6 weeks.  Johnmatthew Solorio 7/22/20141:49 PM

## 2012-10-30 NOTE — Telephone Encounter (Signed)
RECEIVED A FAX FROM [facility] CONCERNING A CONFIRMATION OF PRESCRIPTION SHIPMENT FOR ZYTIGA ON 10/29/12.

## 2012-10-30 NOTE — Telephone Encounter (Signed)
gv and printed appt sched and avs for pt  °

## 2012-10-31 ENCOUNTER — Telehealth: Payer: Self-pay | Admitting: *Deleted

## 2012-10-31 NOTE — Telephone Encounter (Signed)
Message copied by Reesa Chew on Wed Oct 31, 2012 11:14 AM ------      Message from: Myrtis Ser      Created: Wed Oct 31, 2012 10:36 AM       Please call PSA ------

## 2012-10-31 NOTE — Telephone Encounter (Signed)
Called patient with results of PSA done yesterday 

## 2012-11-06 ENCOUNTER — Other Ambulatory Visit: Payer: Self-pay | Admitting: Oncology

## 2012-11-22 ENCOUNTER — Other Ambulatory Visit: Payer: Self-pay | Admitting: *Deleted

## 2012-11-22 DIAGNOSIS — C61 Malignant neoplasm of prostate: Secondary | ICD-10-CM

## 2012-11-22 MED ORDER — ABIRATERONE ACETATE 250 MG PO TABS: ORAL_TABLET | ORAL | Status: DC

## 2012-11-22 NOTE — Telephone Encounter (Signed)
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 

## 2012-11-27 NOTE — Telephone Encounter (Signed)
RECEIVED A FAX FROM [facility] CONCERNING A CONFIRMATION OF PRESCRIPTION SHIPMENT FOR ZYTIGA ON 11/26/12.

## 2012-12-14 ENCOUNTER — Telehealth: Payer: Self-pay | Admitting: Oncology

## 2012-12-14 NOTE — Telephone Encounter (Signed)
MOVED 9/9 APPT O 9/8 DUE TO [facility] OUT. S/W PT HE IS AWARE.

## 2012-12-17 ENCOUNTER — Other Ambulatory Visit (HOSPITAL_BASED_OUTPATIENT_CLINIC_OR_DEPARTMENT_OTHER): Payer: Medicare Other | Admitting: Lab

## 2012-12-17 ENCOUNTER — Ambulatory Visit (HOSPITAL_BASED_OUTPATIENT_CLINIC_OR_DEPARTMENT_OTHER): Payer: Medicare Other | Admitting: Oncology

## 2012-12-17 ENCOUNTER — Telehealth: Payer: Self-pay | Admitting: Oncology

## 2012-12-17 ENCOUNTER — Encounter: Payer: Self-pay | Admitting: Oncology

## 2012-12-17 VITALS — BP 138/48 | HR 69 | Temp 98.0°F | Resp 18 | Ht 65.0 in | Wt 166.7 lb

## 2012-12-17 DIAGNOSIS — C61 Malignant neoplasm of prostate: Secondary | ICD-10-CM

## 2012-12-17 DIAGNOSIS — E291 Testicular hypofunction: Secondary | ICD-10-CM

## 2012-12-17 DIAGNOSIS — R109 Unspecified abdominal pain: Secondary | ICD-10-CM

## 2012-12-17 LAB — CBC WITH DIFFERENTIAL/PLATELET
BASO%: 0.4 % (ref 0.0–2.0)
EOS%: 0.8 % (ref 0.0–7.0)
HCT: 37.4 % — ABNORMAL LOW (ref 38.4–49.9)
LYMPH%: 14.2 % (ref 14.0–49.0)
MCH: 30.3 pg (ref 27.2–33.4)
MCHC: 32.1 g/dL (ref 32.0–36.0)
MCV: 94.4 fL (ref 79.3–98.0)
MONO%: 6.9 % (ref 0.0–14.0)
NEUT%: 77.7 % — ABNORMAL HIGH (ref 39.0–75.0)
Platelets: 105 10*3/uL — ABNORMAL LOW (ref 140–400)

## 2012-12-17 LAB — PSA: PSA: 0.07 ng/mL (ref <=4.00)

## 2012-12-17 LAB — COMPREHENSIVE METABOLIC PANEL (CC13)
ALT: 38 U/L (ref 0–55)
AST: 29 U/L (ref 5–34)
Alkaline Phosphatase: 64 U/L (ref 40–150)
CO2: 27 mEq/L (ref 22–29)
Creatinine: 1.5 mg/dL — ABNORMAL HIGH (ref 0.7–1.3)
Total Bilirubin: 0.93 mg/dL (ref 0.20–1.20)

## 2012-12-17 NOTE — Telephone Encounter (Signed)
gv adn printed appt sched and avs for pt for OCT °

## 2012-12-17 NOTE — Patient Instructions (Signed)
Results for COSTA, JHA (MRN [id number]) as of 12/17/2012 10:47  Ref. Range 05/23/2012 10:45 06/27/2012 11:21 08/08/2012 07:55 09/17/2012 08:01 10/30/2012 13:07  PSA Latest Range: <=4.00 ng/mL 0.26 0.18 0.10 0.09 0.09

## 2012-12-17 NOTE — Progress Notes (Signed)
Hematology and Oncology Follow Up Visit  Shane [name] [id number] 1926-09-19 79 y.o. 12/17/2012 1:15 PM  CC: Shane [name], M.D.  Shane [name], M.D.    Principle Diagnosis:This is an 79 year old gentleman with prostate cancer diagnosed in 57.  He currently has metastatic disease that is asymptomatic.  Prior Therapy: 1. Status post prostatectomy and lymphadenectomy.  He had involvement of the seminal vesicles. 2. Patient treated with adjuvant radiation therapy. 3. Patient developed recurrent disease treated with Lupron and subsequently with Lupron and Casodex and Casodex withdrawal. 4. Patient treated with second-line hormonal manipulation, including ketoconazole, prednisone and subsequently had relapse with PSA up to 47. 5. Received Provenge immunotherapy.  He had treated with 3 Provenge infusions, the last of which was given on 12/17/2010.    Current therapy: Currently on hormonal deprivation 30 mg as needed if his testosterone is more than 20.  Zytiga 1000 mg daily beginning in April 2013.  He is also on Prednisone 5 mg daily..  Interim History:  Shane [name] presents today for a followup visit. He started on Zytiga in April 2013 and overall has tolerated this well. No abdominal pain, nausea, or vomiting. He has continued to be asymptomatic from his disease.  He is not reporting any symptoms of chest pain or difficulty breathing. Did not report any genitourinary complaints.  Performance status and activity level remain excellent at this time.  No recent hospitalizations or illnesses. No bone pain and actually his energy is better. No recent illness or hospitalization.   Medications: I have reviewed the patient's current medications.  Current Outpatient Prescriptions  Medication Sig Dispense Refill  . abiraterone Acetate (ZYTIGA) 250 MG tablet Take 4 tablets (1000mg ) by mouth daily.  Take on empty stomach 1 hour before or 2 hours after a meal.  120 tablet  0  . amLODipine  (NORVASC) 5 MG tablet Take 5 mg by mouth daily.      Marland Kitchen aspirin 81 MG tablet Take 81 mg by mouth daily.      Marland Kitchen atorvastatin (LIPITOR) 20 MG tablet Take 20 mg by mouth daily.      . cholecalciferol (D-VI-SOL) 400 UNIT/ML LIQD Take 400 Units by mouth daily.      Marland Kitchen erythromycin ophthalmic ointment Place into the left eye 4 (four) times daily. Apply 1/2 inch to left eye 4 times a day for 5 days.  3.5 g  0  . gemfibrozil (LOPID) 600 MG tablet Take 600 mg by mouth 2 (two) times daily before a meal.      . glipiZIDE (GLUCOTROL) 5 MG tablet Take 5 mg by mouth 2 (two) times daily before a meal.      . Lactulose 20 GM/30ML SOLN Take 30 mLs (20 g total) by mouth 2 (two) times daily as needed (For constipation).  500 mL  2  . losartan-hydrochlorothiazide (HYZAAR) 100-25 MG per tablet Take 1 tablet by mouth daily.      . metoCLOPramide (REGLAN) 10 MG tablet Take 10 mg by mouth 4 (four) times daily.      Marland Kitchen omeprazole (PRILOSEC) 20 MG capsule Take 20 mg by mouth daily.      . predniSONE (DELTASONE) 5 MG tablet Take 1 tablet (5 mg total) by mouth daily.  90 tablet  2  . sennosides-docusate sodium (SENOKOT-S) 8.6-50 MG tablet Take 2 tablets by mouth 2 (two) times daily.  120 tablet  2  . sulindac (CLINORIL) 200 MG tablet Take 200 mg by mouth 2 (two) times daily.      Marland Kitchen  vitamin C (ASCORBIC ACID) 500 MG tablet Take 500 mg by mouth daily.       No current facility-administered medications for this visit.     Allergies: No Known Allergies  Past Medical History, Surgical history, Social history, and Family History were reviewed and updated.  Review of Systems: Constitutional:  Negative for fever, chills, night sweats, anorexia, weight loss, pain. Cardiovascular: no chest pain or dyspnea on exertion Respiratory: no cough, shortness of breath, or wheezing Neurological: no TIA or stroke symptoms Dermatological: negative ENT: negative Skin: Negative. Gastrointestinal: no abdominal pain, change in bowel habits,  or black or bloody stools Genito-Urinary: no dysuria, trouble voiding, or hematuria Hematological and Lymphatic: negative Breast: negative Musculoskeletal: negative Remaining ROS negative.  Physical Exam: Blood pressure 138/48, pulse 69, temperature 98 F (36.7 C), temperature source Oral, resp. rate 18, height 5\' 5"  (1.651 m), weight 166 lb 11.2 oz (75.615 kg), SpO2 99.00%. ECOG: 1 General appearance: alert Head: Normocephalic, without obvious abnormality, atraumatic Neck: no adenopathy, no carotid bruit, no JVD, supple, symmetrical, trachea midline and thyroid not enlarged, symmetric, no tenderness/mass/nodules Lymph nodes: Cervical, supraclavicular, and axillary nodes normal. Heart:regular rate and rhythm, S1, S2 normal, no murmur, click, rub or gallop Lung:chest clear, no wheezing, rales, normal symmetric air entry Abdomen: soft, non-tender, without masses or organomegaly EXT:no erythema, induration, or nodules Neuro: no deficits noted.   Lab Results: Lab Results  Component Value Date   WBC 6.2 12/17/2012   HGB 12.0* 12/17/2012   HCT 37.4* 12/17/2012   MCV 94.4 12/17/2012   PLT 105* 12/17/2012   CMP     Component Value Date/Time   NA 143 12/17/2012 1030   NA 139 11/15/2011 1226   NA 141 07/06/2011 0855   K 3.8 12/17/2012 1030   K 4.1 11/15/2011 1226   K 3.6 07/06/2011 0855   CL 105 09/17/2012 0801   CL 105 11/15/2011 1226   CL 104 07/06/2011 0855   CO2 27 12/17/2012 1030   CO2 24 11/15/2011 1226   CO2 28 07/06/2011 0855   GLUCOSE 123 12/17/2012 1030   GLUCOSE 207* 09/17/2012 0801   GLUCOSE 209* 11/15/2011 1226   GLUCOSE 90 07/06/2011 0855   BUN 20.2 12/17/2012 1030   BUN 25* 11/15/2011 1226   BUN 18 07/06/2011 0855   CREATININE 1.5* 12/17/2012 1030   CREATININE 1.49* 11/15/2011 1226   CREATININE 1.6* 07/06/2011 0855   CALCIUM 9.3 12/17/2012 1030   CALCIUM 9.3 11/15/2011 1226   CALCIUM 8.8 07/06/2011 0855   PROT 6.9 12/17/2012 1030   PROT 6.5 11/15/2011 1226   PROT 7.5 07/06/2011 0855   ALBUMIN 3.4* 12/17/2012  1030   ALBUMIN 3.7 11/15/2011 1226   AST 29 12/17/2012 1030   AST 16 11/15/2011 1226   AST 22 07/06/2011 0855   ALT 38 12/17/2012 1030   ALT 17 11/15/2011 1226   ALT 18 07/06/2011 0855   ALKPHOS 64 12/17/2012 1030   ALKPHOS 45 11/15/2011 1226   ALKPHOS 56 07/06/2011 0855   BILITOT 0.93 12/17/2012 1030   BILITOT 1.0 11/15/2011 1226   BILITOT 0.80 07/06/2011 0855    Results for Fauteux, METE (MRN [id number]) as of 12/17/2012 10:47  Ref. Range 05/23/2012 10:45 06/27/2012 11:21 08/08/2012 07:55 09/17/2012 08:01 10/30/2012 13:07  PSA Latest Range: <=4.00 ng/mL 0.26 0.18 0.10 0.09 0.09    Impression and Plan: This is a pleasant 79 year old gentleman with the following issues: 1. Castration-resistant prostate cancer.  He is status post Provenge immunotherapy. Currently on Zytiga and  he is tolerating this well. His PSA is continuing to decline at this time. Recommend that he continue this for now. PSA is pending today. 2. Androgen deprivation: His last testosterone from 06/2012 indicated a castrate level we will consider androgen deprivation retreatment in the future.  3. Diabetes: He reports normal BS. He will follow-up with PCP for further management. 4. Right side flan pain: likely musculoskeletal in nature. If it persists, we will restage him with a bone scan and CT scan.   5. Hypokalemia: Potassium level is normal today. 6. Constipation: doing better from this stand point. 7. Follow-up: In 6 weeks.  [place], [place] 9/8/20141:15 PM

## 2012-12-18 ENCOUNTER — Telehealth: Payer: Self-pay | Admitting: *Deleted

## 2012-12-18 ENCOUNTER — Ambulatory Visit: Payer: Medicare Other | Admitting: Oncology

## 2012-12-18 ENCOUNTER — Other Ambulatory Visit: Payer: Medicare Other | Admitting: Lab

## 2012-12-18 NOTE — Telephone Encounter (Signed)
Gave patient results of last PSA 

## 2012-12-26 ENCOUNTER — Encounter: Payer: Self-pay | Admitting: *Deleted

## 2012-12-26 NOTE — Progress Notes (Signed)
Faxed confirmation from [facility] that Zytiga was shipped on 12/25/12 for next day delivery.

## 2013-01-21 ENCOUNTER — Other Ambulatory Visit: Payer: Self-pay | Admitting: *Deleted

## 2013-01-21 NOTE — Telephone Encounter (Signed)
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 

## 2013-01-22 ENCOUNTER — Other Ambulatory Visit: Payer: Self-pay | Admitting: *Deleted

## 2013-01-22 DIAGNOSIS — C61 Malignant neoplasm of prostate: Secondary | ICD-10-CM

## 2013-01-22 MED ORDER — ABIRATERONE ACETATE 250 MG PO TABS: ORAL_TABLET | ORAL | Status: DC

## 2013-01-23 ENCOUNTER — Other Ambulatory Visit: Payer: Self-pay | Admitting: *Deleted

## 2013-01-23 MED ORDER — SENNA-DOCUSATE SODIUM 8.6-50 MG PO TABS: 2.0000 | ORAL_TABLET | Freq: Two times a day (BID) | ORAL | Status: DC

## 2013-01-25 NOTE — Telephone Encounter (Signed)
RECEIVED A FAX FROM [facility] CONCERNING A CONFIRMATION OF PRESCRIPTION SHIPMENT FOR ZYTIGA ON 01/24/13.

## 2013-02-01 ENCOUNTER — Telehealth: Payer: Self-pay | Admitting: *Deleted

## 2013-02-01 ENCOUNTER — Other Ambulatory Visit (HOSPITAL_BASED_OUTPATIENT_CLINIC_OR_DEPARTMENT_OTHER): Payer: Medicare Other | Admitting: Lab

## 2013-02-01 ENCOUNTER — Ambulatory Visit (HOSPITAL_BASED_OUTPATIENT_CLINIC_OR_DEPARTMENT_OTHER): Payer: Medicare Other | Admitting: Oncology

## 2013-02-01 VITALS — BP 137/81 | HR 54 | Temp 97.9°F | Resp 20 | Ht 65.0 in | Wt 166.2 lb

## 2013-02-01 DIAGNOSIS — E876 Hypokalemia: Secondary | ICD-10-CM

## 2013-02-01 DIAGNOSIS — C61 Malignant neoplasm of prostate: Secondary | ICD-10-CM

## 2013-02-01 DIAGNOSIS — E119 Type 2 diabetes mellitus without complications: Secondary | ICD-10-CM

## 2013-02-01 DIAGNOSIS — E291 Testicular hypofunction: Secondary | ICD-10-CM

## 2013-02-01 LAB — COMPREHENSIVE METABOLIC PANEL (CC13)
ALT: 30 U/L (ref 0–55)
AST: 29 U/L (ref 5–34)
Albumin: 3.4 g/dL — ABNORMAL LOW (ref 3.5–5.0)
Alkaline Phosphatase: 84 U/L (ref 40–150)
Anion Gap: 10 mEq/L (ref 3–11)
BUN: 17.8 mg/dL (ref 7.0–26.0)
Calcium: 9.2 mg/dL (ref 8.4–10.4)
Chloride: 107 mEq/L (ref 98–109)
Creatinine: 1.4 mg/dL — ABNORMAL HIGH (ref 0.7–1.3)
Glucose: 110 mg/dl (ref 70–140)

## 2013-02-01 LAB — CBC WITH DIFFERENTIAL/PLATELET
BASO%: 0.3 % (ref 0.0–2.0)
Basophils Absolute: 0 10*3/uL (ref 0.0–0.1)
EOS%: 1.5 % (ref 0.0–7.0)
Eosinophils Absolute: 0.1 10*3/uL (ref 0.0–0.5)
HCT: 38 % — ABNORMAL LOW (ref 38.4–49.9)
HGB: 11.9 g/dL — ABNORMAL LOW (ref 13.0–17.1)
LYMPH%: 13.6 % — ABNORMAL LOW (ref 14.0–49.0)
MCH: 29.6 pg (ref 27.2–33.4)
MCV: 94.4 fL (ref 79.3–98.0)
NEUT%: 78.6 % — ABNORMAL HIGH (ref 39.0–75.0)
Platelets: 111 10*3/uL — ABNORMAL LOW (ref 140–400)
lymph#: 0.8 10*3/uL — ABNORMAL LOW (ref 0.9–3.3)

## 2013-02-01 LAB — PSA: PSA: 0.06 ng/mL (ref <=4.00)

## 2013-02-01 NOTE — Progress Notes (Signed)
Hematology and Oncology Follow Up Visit  Shane [name] [id number] [birth year] 79 y.o. 02/01/2013 1:07 PM   Principle Diagnosis:This is an 79 year old gentleman with prostate cancer diagnosed in 20.  He currently has metastatic disease that is asymptomatic.  Prior Therapy: 1. Status post prostatectomy and lymphadenectomy.  He had involvement of the seminal vesicles. 2. Patient treated with adjuvant radiation therapy. 3. Patient developed recurrent disease treated with Lupron and subsequently with Lupron and Casodex and Casodex withdrawal. 4. Patient treated with second-line hormonal manipulation, including ketoconazole, prednisone and subsequently had relapse with PSA up to 47. 5. Received Provenge immunotherapy.  He had treated with 3 Provenge infusions, the last of which was given on 12/17/2010.    Current therapy: Currently on hormonal deprivation 30 mg as needed if his testosterone is more than 20.  Zytiga 1000 mg daily beginning in April 2013.  He is also on Prednisone 5 mg daily..  Interim History:  Shane [name] presents today for a followup visit. He started on Zytiga in April 2013 and overall has tolerated this well so far. No abdominal pain, nausea, or vomiting. He has continued to be asymptomatic from his disease.  He is not reporting any symptoms of chest pain or difficulty breathing. Did not report any genitourinary complaints.  Performance status and activity level remain excellent at this time.  No recent hospitalizations or illnesses. No bone pain and actually his energy is better. No recent illness or hospitalization. Is not reporting any lower extremity edema or decline in his performance status.  Medications: I have reviewed the patient's current medications.  Current Outpatient Prescriptions  Medication Sig Dispense Refill  . abiraterone Acetate (ZYTIGA) 250 MG tablet Take 4 tablets (1000mg ) by mouth daily.  Take on empty stomach 1 hour before or 2 hours after a meal.  120  tablet  0  . amLODipine (NORVASC) 5 MG tablet Take 5 mg by mouth daily.      Marland Kitchen aspirin 81 MG tablet Take 81 mg by mouth daily.      Marland Kitchen atorvastatin (LIPITOR) 20 MG tablet Take 20 mg by mouth daily.      . cholecalciferol (D-VI-SOL) 400 UNIT/ML LIQD Take 400 Units by mouth daily.      Marland Kitchen erythromycin ophthalmic ointment Place into the left eye 4 (four) times daily. Apply 1/2 inch to left eye 4 times a day for 5 days.  3.5 g  0  . gemfibrozil (LOPID) 600 MG tablet Take 600 mg by mouth 2 (two) times daily before a meal.      . glipiZIDE (GLUCOTROL) 5 MG tablet Take 5 mg by mouth 2 (two) times daily before a meal.      . Lactulose 20 GM/30ML SOLN Take 30 mLs (20 g total) by mouth 2 (two) times daily as needed (For constipation).  500 mL  2  . losartan-hydrochlorothiazide (HYZAAR) 100-25 MG per tablet Take 1 tablet by mouth daily.      . metoCLOPramide (REGLAN) 10 MG tablet Take 10 mg by mouth 4 (four) times daily.      Marland Kitchen omeprazole (PRILOSEC) 20 MG capsule Take 20 mg by mouth daily.      . predniSONE (DELTASONE) 5 MG tablet Take 1 tablet (5 mg total) by mouth daily.  90 tablet  2  . sennosides-docusate sodium (SENOKOT-S) 8.6-50 MG tablet Take 2 tablets by mouth 2 (two) times daily.  120 tablet  2  . sulindac (CLINORIL) 200 MG tablet Take 200 mg by mouth 2 (two) times  daily.      . vitamin C (ASCORBIC ACID) 500 MG tablet Take 500 mg by mouth daily.       No current facility-administered medications for this visit.     Allergies: No Known Allergies  Past Medical History, Surgical history, Social history, and Family History were reviewed and updated.  Review of Systems:  Remaining ROS negative.  Physical Exam: Blood pressure 137/81, pulse 54, temperature 97.9 F (36.6 C), temperature source Oral, resp. rate 20, height 5\' 5"  (1.651 m), weight 166 lb 3.2 oz (75.388 kg). ECOG: 1 General appearance: alert Head: Normocephalic, without obvious abnormality, atraumatic Neck: no adenopathy, no  carotid bruit, no JVD, supple, symmetrical, trachea midline and thyroid not enlarged, symmetric, no tenderness/mass/nodules Lymph nodes: Cervical, supraclavicular, and axillary nodes normal. Heart:regular rate and rhythm, S1, S2 normal, no murmur, click, rub or gallop Lung:chest clear, no wheezing, rales, normal symmetric air entry Abdomen: soft, non-tender, without masses or organomegaly EXT:no erythema, induration, or nodules Neuro: no deficits noted.   Lab Results: Lab Results  Component Value Date   WBC 5.9 02/01/2013   HGB 11.9* 02/01/2013   HCT 38.0* 02/01/2013   MCV 94.4 02/01/2013   PLT 111* 02/01/2013   Results for [place], [place] (MRN [id number]) as of 02/01/2013 13:01  Ref. Range 10/30/2012 13:07 12/17/2012 10:30  PSA Latest Range: <=4.00 ng/mL 0.09 0.07    Impression and Plan: This is a pleasant 79 year old gentleman with the following issues: 1. Castration-resistant prostate cancer.  He is status post Provenge immunotherapy. Currently on Zytiga and he is tolerating this well. His PSA is continuing to decline at this time with his last PSA is close to 0.06. I recommend that he continue this for now. PSA is pending today. 2. Androgen deprivation: His last testosterone from 06/2012 indicated a castrate level we will consider androgen deprivation retreatment in the future.  3. Diabetes: He reports normal BS. He will follow-up with PCP for further management. 4. Right side flan pain: resolved now.  5. Hypokalemia: Potassium level has been stable for months.  6. Constipation: doing better from this stand point. 7. Follow-up: In 6 weeks.  Inaara [name] 10/24/20141:07 PM

## 2013-02-01 NOTE — Telephone Encounter (Signed)
appts made and printed. Pt didn't not want to come on 12/10. He requested 03/21/13...td

## 2013-02-19 ENCOUNTER — Other Ambulatory Visit: Payer: Self-pay | Admitting: *Deleted

## 2013-02-19 NOTE — Telephone Encounter (Signed)
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 

## 2013-02-25 NOTE — Telephone Encounter (Signed)
RECEIVED A FAX FROM [facility] CONCERNING A CONFIRMATION OF PRESCRIPTION SHIPMENT FOR ZYTIGA ON 02/22/13.Marland Kitchen

## 2013-03-20 ENCOUNTER — Telehealth: Payer: Self-pay | Admitting: *Deleted

## 2013-03-20 ENCOUNTER — Ambulatory Visit: Payer: Medicare Other | Admitting: Internal Medicine

## 2013-03-20 ENCOUNTER — Other Ambulatory Visit: Payer: Medicare Other | Admitting: Lab

## 2013-03-20 NOTE — Telephone Encounter (Signed)
Request from [facility] given to Dr. Earl Gala.  Pt. Seeing him tomorrow 03/21/13.

## 2013-03-21 ENCOUNTER — Other Ambulatory Visit (HOSPITAL_BASED_OUTPATIENT_CLINIC_OR_DEPARTMENT_OTHER): Payer: Medicare Other

## 2013-03-21 ENCOUNTER — Other Ambulatory Visit: Payer: Self-pay | Admitting: *Deleted

## 2013-03-21 ENCOUNTER — Ambulatory Visit (HOSPITAL_BASED_OUTPATIENT_CLINIC_OR_DEPARTMENT_OTHER): Payer: Medicare Other | Admitting: Oncology

## 2013-03-21 ENCOUNTER — Telehealth: Payer: Self-pay | Admitting: Oncology

## 2013-03-21 VITALS — BP 133/52 | HR 70 | Temp 98.3°F | Resp 18 | Ht 65.0 in | Wt 165.0 lb

## 2013-03-21 DIAGNOSIS — C61 Malignant neoplasm of prostate: Secondary | ICD-10-CM

## 2013-03-21 DIAGNOSIS — E119 Type 2 diabetes mellitus without complications: Secondary | ICD-10-CM

## 2013-03-21 DIAGNOSIS — E876 Hypokalemia: Secondary | ICD-10-CM

## 2013-03-21 DIAGNOSIS — E291 Testicular hypofunction: Secondary | ICD-10-CM

## 2013-03-21 LAB — CBC WITH DIFFERENTIAL/PLATELET
BASO%: 0.4 % (ref 0.0–2.0)
Eosinophils Absolute: 0 10*3/uL (ref 0.0–0.5)
MCHC: 32 g/dL (ref 32.0–36.0)
MCV: 95.2 fL (ref 79.3–98.0)
MONO#: 0.4 10*3/uL (ref 0.1–0.9)
NEUT#: 4.8 10*3/uL (ref 1.5–6.5)
Platelets: 99 10*3/uL — ABNORMAL LOW (ref 140–400)
RBC: 4.01 10*6/uL — ABNORMAL LOW (ref 4.20–5.82)
RDW: 13.6 % (ref 11.0–14.6)
WBC: 5.9 10*3/uL (ref 4.0–10.3)
lymph#: 0.7 10*3/uL — ABNORMAL LOW (ref 0.9–3.3)

## 2013-03-21 LAB — COMPREHENSIVE METABOLIC PANEL (CC13)
ALT: 28 U/L (ref 0–55)
Albumin: 3.5 g/dL (ref 3.5–5.0)
Anion Gap: 11 mEq/L (ref 3–11)
CO2: 25 mEq/L (ref 22–29)
Calcium: 9.6 mg/dL (ref 8.4–10.4)
Chloride: 108 mEq/L (ref 98–109)
Glucose: 98 mg/dl (ref 70–140)
Potassium: 3.6 mEq/L (ref 3.5–5.1)
Sodium: 144 mEq/L (ref 136–145)
Total Protein: 6.9 g/dL (ref 6.4–8.3)

## 2013-03-21 MED ORDER — ABIRATERONE ACETATE 250 MG PO TABS: ORAL_TABLET | ORAL | Status: DC

## 2013-03-21 NOTE — Progress Notes (Signed)
Hematology and Oncology Follow Up Visit  Shane [name] [id number] 06/23/1926 79 y.o. 03/21/2013 1:39 PM   Principle Diagnosis:This is an 79 year old gentleman with prostate cancer diagnosed in 89.  He currently has metastatic disease that is asymptomatic.  Prior Therapy: 1. Status post prostatectomy and lymphadenectomy.  He had involvement of the seminal vesicles. 2. Patient treated with adjuvant radiation therapy. 3. Patient developed recurrent disease treated with Lupron and subsequently with Lupron and Casodex and Casodex withdrawal. 4. Patient treated with second-line hormonal manipulation, including ketoconazole, prednisone and subsequently had relapse with PSA up to 47. 5. Received Provenge immunotherapy.  He had treated with 3 Provenge infusions, the last of which was given on 12/17/2010.    Current therapy: Currently on hormonal deprivation 30 mg as needed if his testosterone is more than 20.  Zytiga 1000 mg daily beginning in April 2013.  He is also on Prednisone 5 mg daily..  Interim History:  Mr. Shane [name] presents today for a followup visit. He started on Zytiga in April 2013 and overall has tolerated this well so far. No abdominal pain, nausea, or vomiting. He has continued to be asymptomatic from his disease.  He is not reporting any symptoms of chest pain or difficulty breathing. Did not report any genitourinary complaints.  Performance status and activity level remain excellent at this time.  No recent hospitalizations or illnesses. No bone pain and actually his energy is better. No recent illness or hospitalization. Is not reporting any lower extremity edema or decline in his performance status. He has retired from his job but still able to perform most activities of daily living.  Medications: I have reviewed the patient's current medications.  Current Outpatient Prescriptions  Medication Sig Dispense Refill  . abiraterone Acetate (ZYTIGA) 250 MG tablet Take 4 tablets  (1000mg ) by mouth daily.  Take on empty stomach 1 hour before or 2 hours after a meal.  120 tablet  0  . amLODipine (NORVASC) 5 MG tablet Take 5 mg by mouth daily. Patient takes 1/2 tablet daily      . aspirin 81 MG tablet Take 81 mg by mouth daily.      Marland Kitchen atorvastatin (LIPITOR) 20 MG tablet Take 20 mg by mouth daily.      . cholecalciferol (D-VI-SOL) 400 UNIT/ML LIQD Take 400 Units by mouth daily.      Marland Kitchen erythromycin ophthalmic ointment Place into the left eye 4 (four) times daily. Apply 1/2 inch to left eye 4 times a day for 5 days.  3.5 g  0  . gemfibrozil (LOPID) 600 MG tablet Take 600 mg by mouth 2 (two) times daily before a meal.      . glipiZIDE (GLUCOTROL) 5 MG tablet Take 5 mg by mouth 2 (two) times daily before a meal.      . Lactulose 20 GM/30ML SOLN Take 30 mLs (20 g total) by mouth 2 (two) times daily as needed (For constipation).  500 mL  2  . losartan-hydrochlorothiazide (HYZAAR) 100-25 MG per tablet Take 1 tablet by mouth daily.      . metoCLOPramide (REGLAN) 10 MG tablet Take 10 mg by mouth 4 (four) times daily.      Marland Kitchen omeprazole (PRILOSEC) 20 MG capsule Take 20 mg by mouth daily.      . predniSONE (DELTASONE) 5 MG tablet Take 1 tablet (5 mg total) by mouth daily.  90 tablet  2  . sennosides-docusate sodium (SENOKOT-S) 8.6-50 MG tablet Take 2 tablets by mouth 2 (two) times  daily.  120 tablet  2  . sulindac (CLINORIL) 200 MG tablet Take 200 mg by mouth 2 (two) times daily.      . vitamin C (ASCORBIC ACID) 500 MG tablet Take 500 mg by mouth daily.       No current facility-administered medications for this visit.     Allergies: No Known Allergies  Past Medical History, Surgical history, Social history, and Family History were reviewed and updated.  Review of Systems:  Remaining ROS negative.  Physical Exam: Blood pressure 133/52, pulse 70, temperature 98.3 F (36.8 C), temperature source Oral, resp. rate 18, height 5\' 5"  (1.651 m), weight 165 lb (74.844 kg). ECOG:  1 General appearance: alert Head: Normocephalic, without obvious abnormality, atraumatic Neck: no adenopathy, no carotid bruit, no JVD, supple, symmetrical, trachea midline and thyroid not enlarged, symmetric, no tenderness/mass/nodules Lymph nodes: Cervical, supraclavicular, and axillary nodes normal. Heart:regular rate and rhythm, S1, S2 normal, no murmur, click, rub or gallop Lung:chest clear, no wheezing, rales, normal symmetric air entry Abdomen: soft, non-tender, without masses or organomegaly EXT:no erythema, induration, or nodules Neuro: no deficits noted.   Lab Results: Lab Results  Component Value Date   WBC 5.9 03/21/2013   HGB 12.2* 03/21/2013   HCT 38.2* 03/21/2013   MCV 95.2 03/21/2013   PLT 99* 03/21/2013    Results for Shane [name] (MRN [id number]) as of 03/21/2013 12:34  Ref. Range 10/30/2012 13:07 12/17/2012 10:30 02/01/2013 12:31  PSA Latest Range: <=4.00 ng/mL 0.09 0.07 0.06    Impression and Plan: This is a pleasant 79 year old gentleman with the following issues: 1. Castration-resistant prostate cancer.  He is status post Provenge immunotherapy. Currently on Zytiga and he is tolerating this well. His PSA is continuing to decline at this time with his last PSA is close to 0.06. I recommend that he continue this for now. PSA is pending today. 2. Androgen deprivation: His last testosterone from 06/2012 indicated a castrate level we will consider androgen deprivation retreatment in the future.  3. Diabetes: He reports normal BS. He will follow-up with PCP for further management. 4. Right side flan pain: resolved now.  5. Hypokalemia: Potassium level has been stable for months.  6. Constipation: doing better from this stand point. 7. Follow-up: In 8 weeks.  Shane [name] 12/11/20141:39 PM

## 2013-03-21 NOTE — Telephone Encounter (Signed)
gv and printed appt sched and avs for pt for Feb 2015 °

## 2013-03-22 ENCOUNTER — Encounter: Payer: Self-pay | Admitting: *Deleted

## 2013-03-22 NOTE — Progress Notes (Signed)
Spoke with patient, gave results of PSA done yesterday. 

## 2013-03-26 NOTE — Telephone Encounter (Signed)
RECEIVED A FAX FROM [facility] CONCERNING A CONFIRMATION OF PRESCRIPTION SHIPMENT FOR ZYTIGA ON 03/25/13. 

## 2013-04-19 ENCOUNTER — Other Ambulatory Visit: Payer: Self-pay | Admitting: *Deleted

## 2013-04-19 NOTE — Telephone Encounter (Signed)
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

## 2013-04-23 ENCOUNTER — Other Ambulatory Visit: Payer: Self-pay | Admitting: *Deleted

## 2013-04-23 DIAGNOSIS — C61 Malignant neoplasm of prostate: Secondary | ICD-10-CM

## 2013-04-23 MED ORDER — ABIRATERONE ACETATE 250 MG PO TABS: ORAL_TABLET | ORAL | Status: DC

## 2013-04-23 NOTE — Telephone Encounter (Signed)
E-scribed zytiga refill script to [facility]

## 2013-04-24 NOTE — Telephone Encounter (Signed)
[facility] faxed confirmation of facsimile receipt for Zytiga prescription referral.  [facility] will verify insurance and make delivery arrangements with patient. 

## 2013-05-20 ENCOUNTER — Other Ambulatory Visit: Payer: Self-pay | Admitting: *Deleted

## 2013-05-20 DIAGNOSIS — C61 Malignant neoplasm of prostate: Secondary | ICD-10-CM

## 2013-05-20 NOTE — Telephone Encounter (Signed)
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 

## 2013-05-21 MED ORDER — ABIRATERONE ACETATE 250 MG PO TABS: ORAL_TABLET | ORAL | Status: DC

## 2013-05-21 NOTE — Addendum Note (Signed)
Addended by: Wyonia Hough on: 05/21/2013 01:17 PM   Modules accepted: Orders

## 2013-05-22 ENCOUNTER — Other Ambulatory Visit (HOSPITAL_BASED_OUTPATIENT_CLINIC_OR_DEPARTMENT_OTHER): Payer: Medicare Other

## 2013-05-22 ENCOUNTER — Ambulatory Visit (HOSPITAL_BASED_OUTPATIENT_CLINIC_OR_DEPARTMENT_OTHER): Payer: Medicare Other | Admitting: Oncology

## 2013-05-22 ENCOUNTER — Telehealth: Payer: Self-pay | Admitting: Oncology

## 2013-05-22 ENCOUNTER — Encounter: Payer: Self-pay | Admitting: Oncology

## 2013-05-22 VITALS — BP 137/54 | HR 63 | Temp 98.5°F | Resp 18 | Ht 65.0 in | Wt 163.2 lb

## 2013-05-22 DIAGNOSIS — E291 Testicular hypofunction: Secondary | ICD-10-CM

## 2013-05-22 DIAGNOSIS — C61 Malignant neoplasm of prostate: Secondary | ICD-10-CM

## 2013-05-22 DIAGNOSIS — E119 Type 2 diabetes mellitus without complications: Secondary | ICD-10-CM

## 2013-05-22 DIAGNOSIS — E876 Hypokalemia: Secondary | ICD-10-CM

## 2013-05-22 LAB — CBC WITH DIFFERENTIAL/PLATELET
BASO%: 0.4 % (ref 0.0–2.0)
BASOS ABS: 0 10*3/uL (ref 0.0–0.1)
EOS%: 0.5 % (ref 0.0–7.0)
Eosinophils Absolute: 0 10*3/uL (ref 0.0–0.5)
HEMATOCRIT: 38.3 % — AB (ref 38.4–49.9)
HEMOGLOBIN: 12.2 g/dL — AB (ref 13.0–17.1)
LYMPH#: 0.7 10*3/uL — AB (ref 0.9–3.3)
LYMPH%: 18.1 % (ref 14.0–49.0)
MCH: 30.3 pg (ref 27.2–33.4)
MCHC: 31.9 g/dL — AB (ref 32.0–36.0)
MCV: 95 fL (ref 79.3–98.0)
MONO#: 0.4 10*3/uL (ref 0.1–0.9)
MONO%: 9.5 % (ref 0.0–14.0)
NEUT#: 2.9 10*3/uL (ref 1.5–6.5)
NEUT%: 71.5 % (ref 39.0–75.0)
PLATELETS: 97 10*3/uL — AB (ref 140–400)
RBC: 4.04 10*6/uL — ABNORMAL LOW (ref 4.20–5.82)
RDW: 13.8 % (ref 11.0–14.6)
WBC: 4.1 10*3/uL (ref 4.0–10.3)

## 2013-05-22 LAB — COMPREHENSIVE METABOLIC PANEL (CC13)
ALT: 30 U/L (ref 0–55)
AST: 29 U/L (ref 5–34)
Albumin: 3.5 g/dL (ref 3.5–5.0)
Alkaline Phosphatase: 51 U/L (ref 40–150)
Anion Gap: 8 mEq/L (ref 3–11)
BILIRUBIN TOTAL: 1.02 mg/dL (ref 0.20–1.20)
BUN: 20.8 mg/dL (ref 7.0–26.0)
CALCIUM: 9.4 mg/dL (ref 8.4–10.4)
CHLORIDE: 108 meq/L (ref 98–109)
CO2: 27 meq/L (ref 22–29)
CREATININE: 1.5 mg/dL — AB (ref 0.7–1.3)
Glucose: 101 mg/dl (ref 70–140)
Potassium: 3.9 mEq/L (ref 3.5–5.1)
Sodium: 143 mEq/L (ref 136–145)
Total Protein: 6.5 g/dL (ref 6.4–8.3)

## 2013-05-22 LAB — PSA: PSA: 0.05 ng/mL (ref <=4.00)

## 2013-05-22 NOTE — Progress Notes (Signed)
Hematology and Oncology Follow Up Visit  Shane [name] [id number] 02/25/1927 80 y.o. 05/22/2013 1:34 PM   Principle Diagnosis:This is an 80 year old gentleman with prostate cancer diagnosed in 79.  He currently has metastatic disease that is asymptomatic.  Prior Therapy: 1. Status post prostatectomy and lymphadenectomy.  He had involvement of the seminal vesicles. 2. Patient treated with adjuvant radiation therapy. 3. Patient developed recurrent disease treated with Lupron and subsequently with Lupron and Casodex and Casodex withdrawal. 4. Patient treated with second-line hormonal manipulation, including ketoconazole, prednisone and subsequently had relapse with PSA up to 47. 5. Received Provenge immunotherapy.  He had treated with 3 Provenge infusions, the last of which was given on 12/17/2010.    Current therapy: Currently on hormonal deprivation 30 mg as needed if his testosterone is more than 20.  Zytiga 1000 mg daily beginning in April 2013.  He is also on Prednisone 5 mg daily..  Interim History:  Shane [name] presents today for a followup visit. He started on Zytiga in April 2013 and overall has tolerated it well so far. No abdominal pain, nausea, or vomiting. He has continued to be asymptomatic from his disease.  He is not reporting any symptoms of chest pain or difficulty breathing. Did not report any genitourinary complaints.  Performance status and activity level remain excellent at this time.  No recent hospitalizations or illnesses. No bone pain and actually his energy is better. No recent illness or hospitalization. Is not reporting any lower extremity edema or decline in his performance status. He has retired from his job but still able to perform most activities of daily living. He has not reported any constitutional symptoms or any complications related to prednisone. His continued to have a reasonable performance status and quality of life.  Medications: I have reviewed the  patient's current medications.  Current Outpatient Prescriptions  Medication Sig Dispense Refill  . abiraterone Acetate (ZYTIGA) 250 MG tablet Take 4 tablets (1000mg ) by mouth daily.  Take on empty stomach 1 hour before or 2 hours after a meal.  120 tablet  0  . amLODipine (NORVASC) 5 MG tablet Take 5 mg by mouth daily. Patient takes 1/2 tablet daily      . aspirin 81 MG tablet Take 81 mg by mouth daily.      Marland Kitchen atorvastatin (LIPITOR) 20 MG tablet Take 20 mg by mouth daily.      . cholecalciferol (D-VI-SOL) 400 UNIT/ML LIQD Take 400 Units by mouth daily.      Marland Kitchen erythromycin ophthalmic ointment Place into the left eye 4 (four) times daily. Apply 1/2 inch to left eye 4 times a day for 5 days.  3.5 g  0  . gemfibrozil (LOPID) 600 MG tablet Take 600 mg by mouth 2 (two) times daily before a meal.      . glipiZIDE (GLUCOTROL) 5 MG tablet Take 5 mg by mouth 2 (two) times daily before a meal.      . Lactulose 20 GM/30ML SOLN Take 30 mLs (20 g total) by mouth 2 (two) times daily as needed (For constipation).  500 mL  2  . losartan-hydrochlorothiazide (HYZAAR) 100-25 MG per tablet Take 1 tablet by mouth daily.      . metoCLOPramide (REGLAN) 10 MG tablet Take 10 mg by mouth 4 (four) times daily.      Marland Kitchen omeprazole (PRILOSEC) 20 MG capsule Take 20 mg by mouth daily.      . predniSONE (DELTASONE) 5 MG tablet Take 1 tablet (5 mg  total) by mouth daily.  90 tablet  2  . sennosides-docusate sodium (SENOKOT-S) 8.6-50 MG tablet Take 2 tablets by mouth 2 (two) times daily.  120 tablet  2  . sulindac (CLINORIL) 200 MG tablet Take 200 mg by mouth 2 (two) times daily.      . vitamin C (ASCORBIC ACID) 500 MG tablet Take 500 mg by mouth daily.       No current facility-administered medications for this visit.     Allergies: No Known Allergies  Past Medical History, Surgical history, Social history, and Family History were reviewed and updated.  Review of Systems:  Remaining ROS negative.  Physical Exam: Blood  pressure 137/54, pulse 63, temperature 98.5 F (36.9 C), temperature source Oral, resp. rate 18, height 5\' 5"  (1.651 m), weight 163 lb 3.2 oz (74.027 kg), SpO2 100.00%. ECOG: 1 General appearance: alert Head: Normocephalic, without obvious abnormality, atraumatic Neck: no adenopathy, no carotid bruit, no JVD, supple, symmetrical, trachea midline and thyroid not enlarged, symmetric, no tenderness/mass/nodules Lymph nodes: Cervical, supraclavicular, and axillary nodes normal. Heart:regular rate and rhythm, S1, S2 normal, no murmur, click, rub or gallop Lung:chest clear, no wheezing, rales, normal symmetric air entry Abdomen: soft, non-tender, without masses or organomegaly EXT:no erythema, induration, or nodules Neuro: no deficits noted.   Lab Results: Lab Results  Component Value Date   WBC 4.1 05/22/2013   HGB 12.2* 05/22/2013   HCT 38.3* 05/22/2013   MCV 95.0 05/22/2013   PLT 97* 05/22/2013   Results for Shane [name], Shane [name] (MRN [id number]) as of 05/22/2013 13:24  Ref. Range 02/01/2013 12:31 03/21/2013 12:24  PSA Latest Range: <=4.00 ng/mL 0.06 0.05      Impression and Plan: This is a pleasant 80 year old gentleman with the following issues: 1. Castration-resistant prostate cancer.  He is status post Provenge immunotherapy. Currently on Zytiga and he is tolerating this well. His PSA is continuing to decline at this time with his last PSA is close to 0.05. I recommend that he continue this for now. PSA is pending today. 2. Androgen deprivation: His last testosterone from 06/2012 indicated a castrate level we will consider androgen deprivation retreatment in the future.  3. Diabetes: He reports normal BS. He will follow-up with PCP for further management. 4. Right side flan pain: resolved now.  5. Hypokalemia: Potassium level has been stable for months.  6. Constipation: doing better from this stand point. 7. Follow-up: In 8 weeks.  [facility] 2/11/20151:34 PM

## 2013-05-22 NOTE — Telephone Encounter (Signed)
Gave pt appt for lab and MD April 2015

## 2013-05-23 ENCOUNTER — Telehealth: Payer: Self-pay | Admitting: Medical Oncology

## 2013-05-23 NOTE — Telephone Encounter (Signed)
Patient called requesting results to PSA drawn 05/22/13. Patient informed of results. Expresses thanks, denies questions at this time.

## 2013-06-18 ENCOUNTER — Other Ambulatory Visit: Payer: Self-pay | Admitting: *Deleted

## 2013-06-18 NOTE — Telephone Encounter (Signed)
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

## 2013-06-19 ENCOUNTER — Telehealth: Payer: Self-pay | Admitting: *Deleted

## 2013-06-19 ENCOUNTER — Other Ambulatory Visit: Payer: Self-pay | Admitting: *Deleted

## 2013-06-19 DIAGNOSIS — C61 Malignant neoplasm of prostate: Secondary | ICD-10-CM

## 2013-06-19 MED ORDER — ABIRATERONE ACETATE 250 MG PO TABS: ORAL_TABLET | ORAL | Status: DC

## 2013-06-19 NOTE — Telephone Encounter (Signed)
[facility] faxed Zytiga refill request.  Request to provider's in basket for review.

## 2013-06-21 ENCOUNTER — Other Ambulatory Visit: Payer: Self-pay | Admitting: Oncology

## 2013-07-17 ENCOUNTER — Other Ambulatory Visit: Payer: Self-pay | Admitting: *Deleted

## 2013-07-17 DIAGNOSIS — C61 Malignant neoplasm of prostate: Secondary | ICD-10-CM

## 2013-07-17 MED ORDER — ABIRATERONE ACETATE 250 MG PO TABS: ORAL_TABLET | ORAL | Status: DC

## 2013-07-18 NOTE — Telephone Encounter (Signed)
RECEIVED A FAX FROM [facility] CONCERNING A CONFIRMATION OF FACSIMILE RECEIPT FOR PT. REFERRAL. 

## 2013-07-19 ENCOUNTER — Encounter: Payer: Self-pay | Admitting: Oncology

## 2013-07-19 ENCOUNTER — Ambulatory Visit (HOSPITAL_BASED_OUTPATIENT_CLINIC_OR_DEPARTMENT_OTHER): Payer: Medicare Other | Admitting: Oncology

## 2013-07-19 ENCOUNTER — Other Ambulatory Visit (HOSPITAL_BASED_OUTPATIENT_CLINIC_OR_DEPARTMENT_OTHER): Payer: Medicare Other

## 2013-07-19 ENCOUNTER — Telehealth: Payer: Self-pay | Admitting: Oncology

## 2013-07-19 VITALS — BP 139/49 | HR 61 | Temp 98.8°F | Resp 19 | Ht 65.0 in | Wt 161.3 lb

## 2013-07-19 DIAGNOSIS — E291 Testicular hypofunction: Secondary | ICD-10-CM

## 2013-07-19 DIAGNOSIS — E876 Hypokalemia: Secondary | ICD-10-CM

## 2013-07-19 DIAGNOSIS — C61 Malignant neoplasm of prostate: Secondary | ICD-10-CM

## 2013-07-19 LAB — COMPREHENSIVE METABOLIC PANEL (CC13)
ALBUMIN: 3.5 g/dL (ref 3.5–5.0)
ALT: 21 U/L (ref 0–55)
ANION GAP: 9 meq/L (ref 3–11)
AST: 23 U/L (ref 5–34)
Alkaline Phosphatase: 52 U/L (ref 40–150)
BILIRUBIN TOTAL: 0.94 mg/dL (ref 0.20–1.20)
BUN: 23.4 mg/dL (ref 7.0–26.0)
CALCIUM: 9.3 mg/dL (ref 8.4–10.4)
CHLORIDE: 108 meq/L (ref 98–109)
CO2: 28 mEq/L (ref 22–29)
Creatinine: 1.6 mg/dL — ABNORMAL HIGH (ref 0.7–1.3)
GLUCOSE: 119 mg/dL (ref 70–140)
POTASSIUM: 3.8 meq/L (ref 3.5–5.1)
SODIUM: 144 meq/L (ref 136–145)
TOTAL PROTEIN: 6.8 g/dL (ref 6.4–8.3)

## 2013-07-19 LAB — CBC WITH DIFFERENTIAL/PLATELET
BASO%: 0.3 % (ref 0.0–2.0)
Basophils Absolute: 0 10*3/uL (ref 0.0–0.1)
EOS%: 0.4 % (ref 0.0–7.0)
Eosinophils Absolute: 0 10*3/uL (ref 0.0–0.5)
HCT: 39.7 % (ref 38.4–49.9)
HGB: 12.7 g/dL — ABNORMAL LOW (ref 13.0–17.1)
LYMPH#: 0.7 10*3/uL — AB (ref 0.9–3.3)
LYMPH%: 12 % — ABNORMAL LOW (ref 14.0–49.0)
MCH: 30 pg (ref 27.2–33.4)
MCHC: 32.1 g/dL (ref 32.0–36.0)
MCV: 93.6 fL (ref 79.3–98.0)
MONO#: 0.3 10*3/uL (ref 0.1–0.9)
MONO%: 4.9 % (ref 0.0–14.0)
NEUT%: 82.4 % — ABNORMAL HIGH (ref 39.0–75.0)
NEUTROS ABS: 5 10*3/uL (ref 1.5–6.5)
PLATELETS: 96 10*3/uL — AB (ref 140–400)
RBC: 4.24 10*6/uL (ref 4.20–5.82)
RDW: 13.5 % (ref 11.0–14.6)
WBC: 6 10*3/uL (ref 4.0–10.3)

## 2013-07-19 NOTE — Telephone Encounter (Signed)
Gave pt appt for lab and Md for june 2015 °

## 2013-07-19 NOTE — Progress Notes (Signed)
Hematology and Oncology Follow Up Visit  Shane [name] [id number] 27-Jun-1926 80 y.o. 07/19/2013 1:21 PM   Principle Diagnosis:This is an 80 year old gentleman with prostate cancer diagnosed in 53.  He currently has metastatic disease that is asymptomatic.  Prior Therapy: 1. Status post prostatectomy and lymphadenectomy.  He had involvement of the seminal vesicles. 2. Patient treated with adjuvant radiation therapy. 3. Patient developed recurrent disease treated with Lupron and subsequently with Lupron and Casodex and Casodex withdrawal. 4. Patient treated with second-line hormonal manipulation, including ketoconazole, prednisone and subsequently had relapse with PSA up to 47. 5. Received Provenge immunotherapy.  He had treated with 3 Provenge infusions, the last of which was given on 12/17/2010.    Current therapy: Currently on hormonal deprivation 30 mg as needed if his testosterone is more than 20.  Zytiga 1000 mg daily beginning in April 2013.  He is also on Prednisone 5 mg daily. You continue to have an excellent response with PSA nearly undetectable.  Interim History:  Shane [name] presents today for a followup visit. He started on Zytiga in April 2013 and overall has tolerated it well so far. No abdominal pain, nausea, or vomiting. He has continued to be asymptomatic from his disease.  He is not reporting any symptoms of chest pain or difficulty breathing. Did not report any genitourinary complaints.  Performance status and activity level remain excellent at this time.  No recent hospitalizations or illnesses. No bone pain and actually his energy is better. No recent illness or hospitalization. Is not reporting any lower extremity edema or decline in his performance status. He has retired from his job but still able to perform most activities of daily living. He has not reported any constitutional symptoms or any complications related to prednisone. He has not reported any new complications  or any changes in his quality of life.  Medications: I have reviewed the patient's current medications.  Current Outpatient Prescriptions  Medication Sig Dispense Refill  . abiraterone Acetate (ZYTIGA) 250 MG tablet Take 4 tablets (1000mg ) by mouth daily.  Take on empty stomach 1 hour before or 2 hours after a meal.  120 tablet  0  . amLODipine (NORVASC) 5 MG tablet Take 5 mg by mouth daily. Patient takes 1/2 tablet daily      . aspirin 81 MG tablet Take 81 mg by mouth daily.      Marland Kitchen atorvastatin (LIPITOR) 20 MG tablet Take 20 mg by mouth daily.      . cholecalciferol (D-VI-SOL) 400 UNIT/ML LIQD Take 400 Units by mouth daily.      . [facility] SENNA PLUS 8.6-50 MG per tablet TAKE 2 TABLETS BY MOUTH 2 (TWO) TIMES DAILY.  225 tablet  2  . erythromycin ophthalmic ointment Place into the left eye 4 (four) times daily. Apply 1/2 inch to left eye 4 times a day for 5 days.  3.5 g  0  . gemfibrozil (LOPID) 600 MG tablet Take 600 mg by mouth 2 (two) times daily before a meal.      . glipiZIDE (GLUCOTROL) 5 MG tablet Take 5 mg by mouth 2 (two) times daily before a meal.      . Lactulose 20 GM/30ML SOLN Take 30 mLs (20 g total) by mouth 2 (two) times daily as needed (For constipation).  500 mL  2  . losartan-hydrochlorothiazide (HYZAAR) 100-25 MG per tablet Take 1 tablet by mouth daily.      . metoCLOPramide (REGLAN) 10 MG tablet Take 10 mg by mouth  4 (four) times daily.      Marland Kitchen omeprazole (PRILOSEC) 20 MG capsule Take 20 mg by mouth daily.      . predniSONE (DELTASONE) 5 MG tablet Take 1 tablet (5 mg total) by mouth daily.  90 tablet  2  . sulindac (CLINORIL) 200 MG tablet Take 200 mg by mouth 2 (two) times daily.      . vitamin C (ASCORBIC ACID) 500 MG tablet Take 500 mg by mouth daily.       No current facility-administered medications for this visit.     Allergies: No Known Allergies  Past Medical History, Surgical history, Social history, and Family History were reviewed and updated.  Review of  Systems:  Remaining ROS negative.  Physical Exam: Blood pressure 139/49, pulse 61, temperature 98.8 F (37.1 C), temperature source Oral, resp. rate 19, height 5\' 5"  (1.651 m), weight 161 lb 4.8 oz (73.165 kg). ECOG: 1 General appearance: alert Head: Normocephalic, without obvious abnormality, atraumatic Neck: no adenopathy, no carotid bruit, no JVD, supple, symmetrical, trachea midline and thyroid not enlarged, symmetric, no tenderness/mass/nodules Lymph nodes: Cervical, supraclavicular, and axillary nodes normal. Heart:regular rate and rhythm, S1, S2 normal, no murmur, click, rub or gallop Lung:chest clear, no wheezing, rales, normal symmetric air entry Abdomen: soft, non-tender, without masses or organomegaly EXT:no erythema, induration, or nodules Neuro: no deficits noted.   Lab Results: Lab Results  Component Value Date   WBC 6.0 07/19/2013   HGB 12.7* 07/19/2013   HCT 39.7 07/19/2013   MCV 93.6 07/19/2013   PLT 96* 07/19/2013     Results for Shane [name], Shane [name] (MRN [id number]) as of 07/19/2013 13:22  Ref. Range 03/21/2013 12:24 05/22/2013 13:00  PSA Latest Range: <=4.00 ng/mL 0.05 0.05     Impression and Plan: This is a pleasant 80 year old gentleman with the following issues: 1. Castration-resistant prostate cancer.  He is status post Provenge immunotherapy. Currently on Zytiga and he is tolerating this well. His PSA is continuing to decline at this time with his last PSA is close to 0.05. I recommend that he continue this for now. PSA is pending today. He continues to tolerate this medicine well and an excellent PSA response that is durable.  2. Androgen deprivation: His last testosterone from 06/2012 indicated a castrate level we will consider androgen deprivation retreatment in the future.  3. Diabetes: He reports normal BS. He will follow-up with PCP for further management. 4. Right side flan pain: resolved now.  5. Hypokalemia: Potassium level has been stable for months.   6. Constipation: doing better from this stand point. 7. Follow-up: In 8 weeks.  Shane [name] 4/10/20151:21 PM

## 2013-07-20 LAB — PSA: PSA: 0.06 ng/mL (ref <=4.00)

## 2013-07-22 NOTE — Telephone Encounter (Signed)
RECEIVED A FAX FROM [facility] CONCERNING A CONFIRMATION OF PRESCRIPTION SHIPMENT FOR [place] ON 07/19/13.

## 2013-07-24 ENCOUNTER — Other Ambulatory Visit: Payer: Self-pay | Admitting: Oncology

## 2013-07-24 DIAGNOSIS — C61 Malignant neoplasm of prostate: Secondary | ICD-10-CM

## 2013-08-13 ENCOUNTER — Other Ambulatory Visit: Payer: Self-pay | Admitting: Cardiovascular Disease

## 2013-08-13 ENCOUNTER — Ambulatory Visit
Admission: RE | Admit: 2013-08-13 | Discharge: 2013-08-13 | Disposition: A | Discharge status: Home / Self Care | Payer: Medicare Other | Source: Ambulatory Visit | Attending: Cardiovascular Disease | Admitting: Cardiovascular Disease

## 2013-08-13 DIAGNOSIS — R52 Pain, unspecified: Secondary | ICD-10-CM

## 2013-08-13 DIAGNOSIS — W19XXXA Unspecified fall, initial encounter: Secondary | ICD-10-CM

## 2013-08-16 ENCOUNTER — Other Ambulatory Visit: Payer: Self-pay | Admitting: Medical Oncology

## 2013-08-16 DIAGNOSIS — C61 Malignant neoplasm of prostate: Secondary | ICD-10-CM

## 2013-08-16 MED ORDER — ABIRATERONE ACETATE 250 MG PO TABS: ORAL_TABLET | ORAL | Status: DC

## 2013-08-16 NOTE — Telephone Encounter (Signed)
Prescription refill for Zytiga faxed to [facility] @ [phone] 

## 2013-08-19 ENCOUNTER — Ambulatory Visit: Payer: Medicare Other | Attending: Cardiovascular Disease | Admitting: Physical Therapy

## 2013-08-19 DIAGNOSIS — C61 Malignant neoplasm of prostate: Secondary | ICD-10-CM | POA: Insufficient documentation

## 2013-08-19 DIAGNOSIS — R262 Difficulty in walking, not elsewhere classified: Secondary | ICD-10-CM | POA: Insufficient documentation

## 2013-08-19 DIAGNOSIS — I10 Essential (primary) hypertension: Secondary | ICD-10-CM | POA: Insufficient documentation

## 2013-08-19 DIAGNOSIS — IMO0001 Reserved for inherently not codable concepts without codable children: Secondary | ICD-10-CM | POA: Insufficient documentation

## 2013-08-19 DIAGNOSIS — Z9181 History of falling: Secondary | ICD-10-CM | POA: Diagnosis not present

## 2013-08-19 DIAGNOSIS — E119 Type 2 diabetes mellitus without complications: Secondary | ICD-10-CM | POA: Insufficient documentation

## 2013-08-19 DIAGNOSIS — M6281 Muscle weakness (generalized): Secondary | ICD-10-CM | POA: Insufficient documentation

## 2013-08-19 DIAGNOSIS — M25559 Pain in unspecified hip: Secondary | ICD-10-CM | POA: Insufficient documentation

## 2013-08-22 NOTE — Telephone Encounter (Signed)
RECEIVED A FAX FROM [facility] CONCERNING A CONFIRMATION OF PRESCRIPTION SHIPMENT FOR Z[place]TIG[place] ON 08/21/13. 

## 2013-08-24 ENCOUNTER — Other Ambulatory Visit: Payer: Self-pay | Admitting: Oncology

## 2013-08-27 ENCOUNTER — Ambulatory Visit: Payer: Medicare Other | Admitting: Rehabilitation

## 2013-08-27 DIAGNOSIS — IMO0001 Reserved for inherently not codable concepts without codable children: Secondary | ICD-10-CM | POA: Diagnosis not present

## 2013-09-03 ENCOUNTER — Ambulatory Visit: Payer: Medicare Other | Admitting: Rehabilitation

## 2013-09-03 DIAGNOSIS — IMO0001 Reserved for inherently not codable concepts without codable children: Secondary | ICD-10-CM | POA: Diagnosis not present

## 2013-09-09 ENCOUNTER — Ambulatory Visit: Payer: Medicare Other | Attending: Cardiovascular Disease | Admitting: Physical Therapy

## 2013-09-09 DIAGNOSIS — C61 Malignant neoplasm of prostate: Secondary | ICD-10-CM | POA: Insufficient documentation

## 2013-09-09 DIAGNOSIS — M25559 Pain in unspecified hip: Secondary | ICD-10-CM | POA: Diagnosis not present

## 2013-09-09 DIAGNOSIS — I10 Essential (primary) hypertension: Secondary | ICD-10-CM | POA: Insufficient documentation

## 2013-09-09 DIAGNOSIS — R262 Difficulty in walking, not elsewhere classified: Secondary | ICD-10-CM | POA: Insufficient documentation

## 2013-09-09 DIAGNOSIS — IMO0001 Reserved for inherently not codable concepts without codable children: Secondary | ICD-10-CM | POA: Diagnosis present

## 2013-09-09 DIAGNOSIS — M6281 Muscle weakness (generalized): Secondary | ICD-10-CM | POA: Insufficient documentation

## 2013-09-09 DIAGNOSIS — E119 Type 2 diabetes mellitus without complications: Secondary | ICD-10-CM | POA: Insufficient documentation

## 2013-09-11 ENCOUNTER — Ambulatory Visit: Payer: Medicare Other | Admitting: Physical Therapy

## 2013-09-11 DIAGNOSIS — IMO0001 Reserved for inherently not codable concepts without codable children: Secondary | ICD-10-CM | POA: Diagnosis not present

## 2013-09-17 ENCOUNTER — Other Ambulatory Visit: Payer: Self-pay | Admitting: *Deleted

## 2013-09-17 DIAGNOSIS — C61 Malignant neoplasm of prostate: Secondary | ICD-10-CM

## 2013-09-17 MED ORDER — ABIRATERONE ACETATE 250 MG PO TABS: ORAL_TABLET | ORAL | Status: DC

## 2013-09-17 NOTE — Telephone Encounter (Signed)
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

## 2013-09-18 ENCOUNTER — Other Ambulatory Visit: Payer: Self-pay | Admitting: Physician Assistant

## 2013-09-18 ENCOUNTER — Ambulatory Visit (HOSPITAL_BASED_OUTPATIENT_CLINIC_OR_DEPARTMENT_OTHER): Payer: Medicare Other | Admitting: Family

## 2013-09-18 ENCOUNTER — Other Ambulatory Visit (HOSPITAL_BASED_OUTPATIENT_CLINIC_OR_DEPARTMENT_OTHER): Payer: Medicare Other

## 2013-09-18 ENCOUNTER — Encounter: Payer: Medicare Other | Admitting: Physician Assistant

## 2013-09-18 ENCOUNTER — Telehealth: Payer: Self-pay | Admitting: Oncology

## 2013-09-18 DIAGNOSIS — I2510 Atherosclerotic heart disease of native coronary artery without angina pectoris: Secondary | ICD-10-CM | POA: Diagnosis not present

## 2013-09-18 DIAGNOSIS — C61 Malignant neoplasm of prostate: Secondary | ICD-10-CM

## 2013-09-18 DIAGNOSIS — I10 Essential (primary) hypertension: Secondary | ICD-10-CM

## 2013-09-18 DIAGNOSIS — G609 Hereditary and idiopathic neuropathy, unspecified: Secondary | ICD-10-CM

## 2013-09-18 LAB — CBC WITH DIFFERENTIAL/PLATELET
BASO%: 0.7 % (ref 0.0–2.0)
Basophils Absolute: 0 10*3/uL (ref 0.0–0.1)
EOS ABS: 0 10*3/uL (ref 0.0–0.5)
EOS%: 0.8 % (ref 0.0–7.0)
HCT: 38.5 % (ref 38.4–49.9)
HGB: 12.1 g/dL — ABNORMAL LOW (ref 13.0–17.1)
LYMPH%: 13.4 % — AB (ref 14.0–49.0)
MCH: 29.7 pg (ref 27.2–33.4)
MCHC: 31.4 g/dL — AB (ref 32.0–36.0)
MCV: 94.4 fL (ref 79.3–98.0)
MONO#: 0.4 10*3/uL (ref 0.1–0.9)
MONO%: 6.7 % (ref 0.0–14.0)
NEUT#: 4.4 10*3/uL (ref 1.5–6.5)
NEUT%: 78.4 % — ABNORMAL HIGH (ref 39.0–75.0)
PLATELETS: 93 10*3/uL — AB (ref 140–400)
RBC: 4.08 10*6/uL — ABNORMAL LOW (ref 4.20–5.82)
RDW: 13.4 % (ref 11.0–14.6)
WBC: 5.6 10*3/uL (ref 4.0–10.3)
lymph#: 0.8 10*3/uL — ABNORMAL LOW (ref 0.9–3.3)

## 2013-09-18 LAB — COMPREHENSIVE METABOLIC PANEL (CC13)
ALK PHOS: 94 U/L (ref 40–150)
ALT: 22 U/L (ref 0–55)
AST: 22 U/L (ref 5–34)
Albumin: 3.3 g/dL — ABNORMAL LOW (ref 3.5–5.0)
Anion Gap: 10 mEq/L (ref 3–11)
BUN: 23.4 mg/dL (ref 7.0–26.0)
CO2: 26 mEq/L (ref 22–29)
CREATININE: 1.5 mg/dL — AB (ref 0.7–1.3)
Calcium: 8.8 mg/dL (ref 8.4–10.4)
Chloride: 108 mEq/L (ref 98–109)
GLUCOSE: 115 mg/dL (ref 70–140)
Potassium: 3.5 mEq/L (ref 3.5–5.1)
SODIUM: 143 meq/L (ref 136–145)
TOTAL PROTEIN: 6.5 g/dL (ref 6.4–8.3)
Total Bilirubin: 1.24 mg/dL — ABNORMAL HIGH (ref 0.20–1.20)

## 2013-09-18 NOTE — Patient Instructions (Signed)
Discussed labs and current treatment with patient. Patient will continue Zytiga and Prednisone at same doses. He will return for a follow-up appoint with Dr. Alen Blew with CBC, CMP and PSA in approximately 8 weeks. Regarding the peripheral neuropathy, if symptoms persist or worsen, patient advised to follow up with his PCP or call our office.  All questions were answered. The patient knows to call the clinic with any problems, questions or concerns. We can certainly see the patient much sooner if necessary.

## 2013-09-18 NOTE — Progress Notes (Signed)
Shane Fontana, MD No address on file  Diagnosis Prostate cancer   Primary site: Prostate   Staging method: AJCC 7th Edition   Clinical free text: Stage IV   Summary: Incomplete stage  Prior Therapy: 1. Status post prostatectomy and lymphadenectomy. He had involvement of the seminal vesicles.   2. Patient treated with adjuvant radiation therapy.   3. Patient developed recurrent disease treated with Lupron and subsequently with Lupron and Casodex and casodex withdrawal.   4. Patient treated with second-line hormonal manipulation, including ketoconazole, prednisone and subsequently had relapse with PSA up to 47.  5. Received Provenge immunotherapy. He was treated with 3 Provenge infusions, the last of which was given on 12/17/2010.   Past medical, surgical and family history reviewed and updated accordingly.   ROS: General ROS: negative for - chills, fatigue, fever, hot flashes, malaise, night sweats, sleep disturbance, weight gain or the patient has lost 2 lbs since last visit.  Psychological ROS: negative for - anxiety, behavioral disorder, concentration difficulties, depression, disorientation or hallucinations Hematological and Lymphatic ROS: negative for - bleeding problems, blood clots, bruising, fatigue, night sweats or swollen lymph nodes Endocrine ROS: negative for - hot flashes, malaise/lethargy, mood swings, palpitations, polydipsia/polyuria, skin changes, temperature intolerance or unexpected weight changes Respiratory ROS: negative for - cough, hemoptysis, orthopnea, pleuritic pain, shortness of breath, sputum changes, stridor, tachypnea or wheezing Cardiovascular ROS: negative for - chest pain, dyspnea on exertion, edema, loss of consciousness, palpitations, paroxysmal nocturnal dyspnea, rapid heart rate or shortness of breath Gastrointestinal ROS: negative for - abdominal pain, appetite loss, blood in stools, change in bowel habits, change in stools, constipation, diarrhea,  gas/bloating, heartburn, hematemesis, melena, nausea/vomiting, stool incontinence or swallowing difficulty/pain Genito-Urinary ROS: negative for - dysuria, hematuria, incontinence, pelvic pain, scrotal mass/pain or urinary frequency/urgency Musculoskeletal ROS: negative for - gait disturbance, muscle pain or muscular weakness Neurological ROS: negative for - behavioral changes, bowel and bladder control changes, confusion, dizziness, gait disturbance, headaches, impaired coordination/balance, memory loss, speech problems, visual changes or weakness  CURRENT THERAPY: The patient is currently on hormonal deprivation 107m as needed if his testosterone is more than 20. Zytiga 10064mdaily since April 2013. He is also on Prednisone 90m23maily. He continue to have an excellent response with PSA nearly undetectable.   INTERVAL HISTORY: [name] 79o. male returns for regular 8 week visit for followup of metastatic prostate cancer.    Past Medical History  Diagnosis Date  . Diabetes mellitus   . Prostate ca     prostate ca dx 20 yrs ago  . Hypertension   . CAD (coronary artery disease)   . Hyperlipidemia   . Hyperthyroidism   . Anxiety   . Sleep apnea     has Prostate cancer on his problem list.     has No Known Allergies.  Mr. SelActones not currently have medications on file.  Past Surgical History  Procedure Laterality Date  . Appendectomy    . Hernia repair    . Penile prosthesis implant      PHYSICAL EXAMINATION  ECOG PERFORMANCE STATUS: 0 - Asymptomatic  GENERAL:alert, healthy, no distress, well nourished, comfortable and smiling SKIN: skin color, texture, turgor are normal, no rashes or significant lesions HEAD: Normocephalic, No masses, lesions, tenderness or abnormalities EYES: normal, PERRLA, Conjunctiva are pink and non-injected, sclera clear EARS: External ears normal OROPHARYNX:no exudate, no erythema, lips, buccal mucosa, and tongue normal and dentition  normal  NECK: supple, no adenopathy, no bruits, no JVD,  thyroid normal size, non-tender, without nodularity, no stridor, non-tender, trachea midline LYMPH:  no palpable lymphadenopathy, no hepatosplenomegaly LUNGS: clear to auscultation , and palpation, clear to auscultation and percussion HEART: regular rate & rhythm, no murmurs, S1 normal and S2 normal ABDOMEN:abdomen soft, non-tender, normal bowel sounds, no masses or organomegaly, no bladder distention identified, no rebound or guarding and no bruits BACK: Back symmetric, no curvature., No CVA tenderness EXTREMITIES:less then 2 second capillary refill, no joint deformities, effusion, or inflammation, no skin discoloration, no clubbing, no cyanosis. Patient did state that he sat on a stack of bricks and when they collapsed he fell and hurt his left knee. He went to the hospital and had the left knee x-rayed and there was no break. He as been having physical therapy. His knee does not appear to be swollen or tender. The patient has the left knee bandaged and does not complain of pain or tenderness. He states that he can "get around fine". NEURO: alert & oriented x 3 with fluent speech, no focal motor deficits, pt does c/o occasional numbness/tingling in left heel and left pinky finger tip.   LABORATORY DATA: CBC    Component Value Date/Time   WBC 5.6 09/18/2013 1316   RBC 4.08* 09/18/2013 1316   HGB 12.1* 09/18/2013 1316   HCT 38.5 09/18/2013 1316   PLT 93* 09/18/2013 1316   MCV 94.4 09/18/2013 1316   MCH 29.7 09/18/2013 1316   MCH 29.8 04/20/2010 1403   MCHC 31.4* 09/18/2013 1316   RDW 13.4 09/18/2013 1316   LYMPHSABS 0.8* 09/18/2013 1316   MONOABS 0.4 09/18/2013 1316   EOSABS 0.0 09/18/2013 1316   BASOSABS 0.0 09/18/2013 1316   CMP     Component Value Date/Time   NA 143 09/18/2013 1316   NA 139 11/15/2011 1226   NA 141 07/06/2011 0855   K 3.5 09/18/2013 1316   K 4.1 11/15/2011 1226   K 3.6 07/06/2011 0855   CL 105 09/17/2012 0801   CL 105 11/15/2011  1226   CL 104 07/06/2011 0855   CO2 26 09/18/2013 1316   CO2 24 11/15/2011 1226   CO2 28 07/06/2011 0855   GLUCOSE 115 09/18/2013 1316   GLUCOSE 207* 09/17/2012 0801   GLUCOSE 209* 11/15/2011 1226   GLUCOSE 90 07/06/2011 0855   BUN 23.4 09/18/2013 1316   BUN 25* 11/15/2011 1226   BUN 18 07/06/2011 0855   CREATININE 1.5* 09/18/2013 1316   CREATININE 1.49* 11/15/2011 1226   CREATININE 1.6* 07/06/2011 0855   CALCIUM 8.8 09/18/2013 1316   CALCIUM 9.3 11/15/2011 1226   CALCIUM 8.8 07/06/2011 0855   PROT 6.5 09/18/2013 1316   PROT 6.5 11/15/2011 1226   PROT 7.5 07/06/2011 0855   ALBUMIN 3.3* 09/18/2013 1316   ALBUMIN 3.7 11/15/2011 1226   AST 22 09/18/2013 1316   AST 16 11/15/2011 1226   AST 22 07/06/2011 0855   ALT 22 09/18/2013 1316   ALT 17 11/15/2011 1226   ALT 18 07/06/2011 0855   ALKPHOS 94 09/18/2013 1316   ALKPHOS 45 11/15/2011 1226   ALKPHOS 56 07/06/2011 0855   BILITOT 1.24* 09/18/2013 1316   BILITOT 1.0 11/15/2011 1226   BILITOT 0.80 07/06/2011 0855   PSA 09/18/2013 pending.  RADIOGRAPHIC STUDIES: Bone scan performed 07/06/2011  Clinical Data: Prostate cancer. Elevated serum PSA last month.  NUCLEAR MEDICINE WHOLE BODY BONE SCINTIGRAPHY  Technique: Whole body anterior and posterior images were obtained  approximately 3 hours after intravenous injection of  radiopharmaceutical.  Radiopharmaceutical: 25.3MILLI CURIE TC-MDP TECHNETIUM TC 27M  MEDRONATE IV KIT  Comparison: CTs of the chest, abdomen and pelvis performed today.  Whole body bone scan 09/22/2010.  Findings: There is new focally intense activity near the anterior  costal margin of the right fourth rib. This demonstrates shine  through on the posterior images. No significant residual abnormal  left rib activity is seen. Mild lumbar spine activity on the right  at L2 is minimally more prominent, but likely arthropathic. There  is no abnormal osseous activity within the pelvis or lower  extremities.  The soft tissue activity is stable. There is  urine contamination  at the tip of the penis.  IMPRESSION:  1. New focally increased activity in the anterior costal margin of  the right fourth rib has no definite correlate on today's CT.  Appearance is suspicious for a nondisplaced fracture or  costochondritis.  2. Probable degenerative activity in the upper lumbar spine.  3. No activity suspicious for osseous metastatic disease. Earlier  CT does show an enlarging nodal metastasis within the right false  pelvis.  Original Report Authenticated By: Vivia Ewing, M.D.  ASSESSMENT: The patient presents today for an 8 week follow up appointment for prostate cancer. He states that he is tolerating his Zytiga and prednisone well. He denies having any nausea, vomiting or diarrhea. He states that his constipation is effectively controlled with [place]. He has no c/o pain, SOB or cough. His performance status and activity level remain excellent at this time. He was recently in the ED after a pile of brick he was sitting on collapsed and he landed on his left knee. He did not break the knee and has completed physical therapy and has had no other problems.    PLAN: Discussed labs and current treatment with patient. Patient will continue Zytiga and Prednisone at same doses. He will return for a follow-up appoint with Dr. Alen Blew with CBC, CMP and PSA in approximately 8 weeks. Regarding the peripheral neuropathy, if symptoms persist or worsen, patient advised to follow up with his PCP or call our office.  All questions were answered. The patient knows to call the clinic with any problems, questions or concerns. We can certainly see the patient much sooner if necessary.  The patient and plan discussed with Zola Button, MD and he is in agreement with the aforementioned.  I spent 25 minutes counseling the patient face to face. The total time spent in the appointment was 30 minutes.  [facility] M 09/18/2013

## 2013-09-18 NOTE — Telephone Encounter (Signed)
gv and printed appt sched and avs for pt for Aug °

## 2013-09-19 LAB — PSA: PSA: 0.07 ng/mL (ref <=4.00)

## 2013-09-24 NOTE — Telephone Encounter (Signed)
RECEIVED A FAX FROM [facility] CONCERNING A CONFIRMATION OF PRESCRIPTION SHIPMENT FOR [place] ON 09/20/13.

## 2013-09-25 ENCOUNTER — Ambulatory Visit: Payer: Medicare Other | Admitting: Physical Therapy

## 2013-10-07 ENCOUNTER — Ambulatory Visit: Payer: Medicare Other | Admitting: Physical Therapy

## 2013-10-08 ENCOUNTER — Ambulatory Visit: Payer: Medicare Other | Admitting: Physical Therapy

## 2013-10-14 ENCOUNTER — Other Ambulatory Visit: Payer: Self-pay | Admitting: Oncology

## 2013-10-15 ENCOUNTER — Other Ambulatory Visit: Payer: Self-pay | Admitting: *Deleted

## 2013-10-15 DIAGNOSIS — C61 Malignant neoplasm of prostate: Secondary | ICD-10-CM

## 2013-10-15 MED ORDER — ABIRATERONE ACETATE 250 MG PO TABS: ORAL_TABLET | ORAL | Status: DC

## 2013-10-15 NOTE — Telephone Encounter (Signed)
Refill request to MD desk

## 2013-10-16 ENCOUNTER — Other Ambulatory Visit: Payer: Self-pay | Admitting: *Deleted

## 2013-10-16 DIAGNOSIS — C61 Malignant neoplasm of prostate: Secondary | ICD-10-CM

## 2013-10-16 MED ORDER — LACTULOSE 20 GM/30ML PO SOLN: 30.0000 mL | Freq: Two times a day (BID) | ORAL | Status: DC | PRN

## 2013-10-16 NOTE — Telephone Encounter (Signed)
Call received from [place] [facility] requesting refill on patient's behalf.

## 2013-10-16 NOTE — Telephone Encounter (Signed)
Received fax from [facility] stating referral received & will be in touch once insurance verified & delivery arrangements made.

## 2013-10-22 NOTE — Telephone Encounter (Signed)
RECEIVED A FAX FROM [facility] CONCERNING A CONFIRMATION OF PRESCRIPTION SHIPMENT FOR [place] ON 10/21/13.

## 2013-10-25 ENCOUNTER — Other Ambulatory Visit: Payer: Self-pay | Admitting: Oncology

## 2013-11-13 ENCOUNTER — Other Ambulatory Visit (HOSPITAL_BASED_OUTPATIENT_CLINIC_OR_DEPARTMENT_OTHER): Payer: Medicare Other

## 2013-11-13 ENCOUNTER — Ambulatory Visit (HOSPITAL_BASED_OUTPATIENT_CLINIC_OR_DEPARTMENT_OTHER): Payer: Medicare Other | Admitting: Oncology

## 2013-11-13 ENCOUNTER — Encounter: Payer: Self-pay | Admitting: Oncology

## 2013-11-13 ENCOUNTER — Telehealth: Payer: Self-pay | Admitting: Oncology

## 2013-11-13 VITALS — BP 134/46 | HR 63 | Temp 98.6°F | Resp 18 | Ht 65.0 in | Wt 157.8 lb

## 2013-11-13 DIAGNOSIS — C61 Malignant neoplasm of prostate: Secondary | ICD-10-CM

## 2013-11-13 LAB — COMPREHENSIVE METABOLIC PANEL (CC13)
ALT: 16 U/L (ref 0–55)
AST: 21 U/L (ref 5–34)
Albumin: 3.4 g/dL — ABNORMAL LOW (ref 3.5–5.0)
Alkaline Phosphatase: 62 U/L (ref 40–150)
Anion Gap: 9 mEq/L (ref 3–11)
BUN: 21.6 mg/dL (ref 7.0–26.0)
CO2: 27 mEq/L (ref 22–29)
Calcium: 9.2 mg/dL (ref 8.4–10.4)
Chloride: 106 mEq/L (ref 98–109)
Creatinine: 1.4 mg/dL — ABNORMAL HIGH (ref 0.7–1.3)
GLUCOSE: 157 mg/dL — AB (ref 70–140)
Potassium: 3.6 mEq/L (ref 3.5–5.1)
SODIUM: 142 meq/L (ref 136–145)
TOTAL PROTEIN: 6.8 g/dL (ref 6.4–8.3)
Total Bilirubin: 1.02 mg/dL (ref 0.20–1.20)

## 2013-11-13 LAB — CBC WITH DIFFERENTIAL/PLATELET
BASO%: 0.2 % (ref 0.0–2.0)
Basophils Absolute: 0 10*3/uL (ref 0.0–0.1)
EOS ABS: 0.1 10*3/uL (ref 0.0–0.5)
EOS%: 0.9 % (ref 0.0–7.0)
HCT: 39.3 % (ref 38.4–49.9)
HGB: 12.5 g/dL — ABNORMAL LOW (ref 13.0–17.1)
LYMPH%: 12.9 % — ABNORMAL LOW (ref 14.0–49.0)
MCH: 29.7 pg (ref 27.2–33.4)
MCHC: 31.7 g/dL — AB (ref 32.0–36.0)
MCV: 93.6 fL (ref 79.3–98.0)
MONO#: 0.4 10*3/uL (ref 0.1–0.9)
MONO%: 6.5 % (ref 0.0–14.0)
NEUT#: 4.9 10*3/uL (ref 1.5–6.5)
NEUT%: 79.5 % — ABNORMAL HIGH (ref 39.0–75.0)
PLATELETS: 116 10*3/uL — AB (ref 140–400)
RBC: 4.2 10*6/uL (ref 4.20–5.82)
RDW: 13.9 % (ref 11.0–14.6)
WBC: 6.2 10*3/uL (ref 4.0–10.3)
lymph#: 0.8 10*3/uL — ABNORMAL LOW (ref 0.9–3.3)

## 2013-11-13 NOTE — Telephone Encounter (Signed)
gv adn printed appt sched and avs for pt for Sept.... °

## 2013-11-13 NOTE — Progress Notes (Signed)
Hematology and Oncology Follow Up Visit  Shane [name] [id number] [birth year] 80 y.o. 11/13/2013 1:23 PM   Principle Diagnosis:This is an 80 year old gentleman with prostate cancer diagnosed in 74.  He currently has metastatic disease that is asymptomatic.  Prior Therapy: 1. Status post prostatectomy and lymphadenectomy.  He had involvement of the seminal vesicles. 2. Patient treated with adjuvant radiation therapy. 3. Patient developed recurrent disease treated with Lupron and subsequently with Lupron and Casodex and Casodex withdrawal. 4. Patient treated with second-line hormonal manipulation, including ketoconazole, prednisone and subsequently had relapse with PSA up to 47. 5. Received Provenge immunotherapy.  He had treated with 3 Provenge infusions, the last of which was given on 12/17/2010.    Current therapy: Currently on hormonal deprivation 30 mg as needed if his testosterone is more than 20.  Zytiga 1000 mg daily with Prednisone beginning in April 2013.  He continues to have excellent response with PSA nearly undetectable.  Interim History:  Mr. Shane [name] presents today for a followup visit by himself. He continues on Zytiga and has tolerated it well so far. He reports no new symptoms since his last visit. No abdominal pain, nausea, or vomiting. He has continued to be asymptomatic from his disease. He did not report chest pain or difficulty breathing. Did not report any genitourinary complaints.  Performance status and activity level remain excellent at this time.  No recent hospitalizations or illnesses. No bone pain and actually his energy is better. Is not reporting any lower extremity edema or decline in his performance status.  He has not reported any constitutional symptoms or any complications related to prednisone. He has not reported any new complications or any changes in his quality of life. Rest of his review of systems unremarkable.  Medications: I have reviewed the  patient's current medications.  Current Outpatient Prescriptions  Medication Sig Dispense Refill  . abiraterone Acetate (ZYTIGA) 250 MG tablet Take 4 tablets (1000mg ) by mouth daily.  Take on empty stomach 1 hour before or 2 hours after a meal.  120 tablet  0  . amLODipine (NORVASC) 5 MG tablet Take 5 mg by mouth daily. Patient takes 1/2 tablet daily      . aspirin 81 MG tablet Take 81 mg by mouth daily.      Marland Kitchen atorvastatin (LIPITOR) 20 MG tablet Take 20 mg by mouth daily.      . cholecalciferol (D-VI-SOL) 400 UNIT/ML LIQD Take 400 Units by mouth daily.      . [facility] SENNA PLUS 8.6-50 MG per tablet TAKE 2 TABLETS BY MOUTH 2 (TWO) TIMES DAILY.  180 tablet  2  . erythromycin ophthalmic ointment Place into the left eye 4 (four) times daily. Apply 1/2 inch to left eye 4 times a day for 5 days.  3.5 g  0  . gemfibrozil (LOPID) 600 MG tablet Take 600 mg by mouth 2 (two) times daily before a meal.      . glipiZIDE (GLUCOTROL) 5 MG tablet Take 5 mg by mouth 2 (two) times daily before a meal.      . Lactulose 20 GM/30ML SOLN Take 30 mLs (20 g total) by mouth 2 (two) times daily as needed (For constipation).  500 mL  2  . losartan-hydrochlorothiazide (HYZAAR) 100-25 MG per tablet Take 1 tablet by mouth daily.      . metoCLOPramide (REGLAN) 10 MG tablet Take 10 mg by mouth 4 (four) times daily.      Marland Kitchen omeprazole (PRILOSEC) 20 MG capsule Take 20  mg by mouth daily.      . predniSONE (DELTASONE) 5 MG tablet TAKE 1 TABLET BY MOUTH DAILY  90 tablet  0  . sulindac (CLINORIL) 200 MG tablet Take 200 mg by mouth 2 (two) times daily.      . vitamin C (ASCORBIC ACID) 500 MG tablet Take 500 mg by mouth daily.       No current facility-administered medications for this visit.     Allergies: No Known Allergies  Past Medical History, Surgical history, Social history, and Family History were reviewed and updated.    Physical Exam: Blood pressure 134/46, pulse 63, temperature 98.6 F (37 C), temperature source  Oral, resp. rate 18, height 5\' 5"  (1.651 m), weight 157 lb 12.8 oz (71.578 kg), SpO2 100.00%. ECOG: 1 General appearance: alert Head: Normocephalic, without obvious abnormality, atraumatic Neck: no adenopathy Lymph nodes: Cervical, supraclavicular, and axillary nodes normal. Heart:regular rate and rhythm, S1, S2 normal, no murmur, click, rub or gallop Lung:chest clear, no wheezing, rales, normal symmetric air entry Abdomen: soft, non-tender, without masses or organomegaly EXT:no erythema, induration, or nodules Neuro: no deficits noted.   Lab Results: Lab Results  Component Value Date   WBC 6.2 11/13/2013   HGB 12.5* 11/13/2013   HCT 39.3 11/13/2013   MCV 93.6 11/13/2013   PLT 116* 11/13/2013     Results for [name], [name] (MRN [id number]) as of 11/13/2013 13:26  Ref. Range 07/19/2013 12:30 09/18/2013 13:17  PSA Latest Range: <=4.00 ng/mL 0.06 0.07      Impression and Plan: This is a pleasant 80 year old gentleman with the following issues: 1. Castration-resistant prostate cancer.  He is status post Provenge immunotherapy but currently on Zytiga and he is tolerating this well. His PSA continues to be very low close to 0.05. He continues to [place] well and an excellent PSA response that is durable. I plan on continuing the current dose and schedule. 2. Androgen deprivation: His last testosterone from 06/2012 indicated a castrate level we will consider androgen deprivation retreatment in the future.  3. Diabetes: He reports normal BS. He will follow-up with PCP for further management. 4. Hypokalemia: Potassium level is normal on today's evaluation. 5. Constipation: doing better from this stand point. He uses over-the-counter medication with excellent response. 6. Follow-up: In 8 weeks.  [facility] 8/5/20151:23 PM

## 2013-11-14 ENCOUNTER — Other Ambulatory Visit: Payer: Self-pay | Admitting: *Deleted

## 2013-11-14 DIAGNOSIS — C61 Malignant neoplasm of prostate: Secondary | ICD-10-CM

## 2013-11-14 LAB — PSA: PSA: 0.1 ng/mL (ref <=4.00)

## 2013-11-14 MED ORDER — ABIRATERONE ACETATE 250 MG PO TABS: ORAL_TABLET | ORAL | Status: DC

## 2013-11-14 NOTE — Telephone Encounter (Signed)
Refill request to MD desk for approval. 

## 2013-12-18 ENCOUNTER — Other Ambulatory Visit: Payer: Self-pay | Admitting: *Deleted

## 2013-12-18 DIAGNOSIS — C61 Malignant neoplasm of prostate: Secondary | ICD-10-CM

## 2013-12-18 MED ORDER — ABIRATERONE ACETATE 250 MG PO TABS: ORAL_TABLET | ORAL | Status: DC

## 2013-12-23 ENCOUNTER — Encounter: Payer: Self-pay | Admitting: *Deleted

## 2014-01-08 ENCOUNTER — Telehealth: Payer: Self-pay | Admitting: Oncology

## 2014-01-08 ENCOUNTER — Encounter: Payer: Self-pay | Admitting: Oncology

## 2014-01-08 ENCOUNTER — Ambulatory Visit (HOSPITAL_BASED_OUTPATIENT_CLINIC_OR_DEPARTMENT_OTHER): Payer: Medicare Other | Admitting: Oncology

## 2014-01-08 ENCOUNTER — Other Ambulatory Visit (HOSPITAL_BASED_OUTPATIENT_CLINIC_OR_DEPARTMENT_OTHER): Payer: Medicare Other

## 2014-01-08 ENCOUNTER — Encounter: Payer: Self-pay | Admitting: *Deleted

## 2014-01-08 VITALS — BP 136/45 | HR 47 | Temp 98.5°F | Resp 20 | Ht 65.0 in | Wt 156.8 lb

## 2014-01-08 DIAGNOSIS — C61 Malignant neoplasm of prostate: Secondary | ICD-10-CM

## 2014-01-08 DIAGNOSIS — E291 Testicular hypofunction: Secondary | ICD-10-CM

## 2014-01-08 DIAGNOSIS — K5900 Constipation, unspecified: Secondary | ICD-10-CM

## 2014-01-08 DIAGNOSIS — I10 Essential (primary) hypertension: Secondary | ICD-10-CM

## 2014-01-08 DIAGNOSIS — E876 Hypokalemia: Secondary | ICD-10-CM

## 2014-01-08 LAB — COMPREHENSIVE METABOLIC PANEL (CC13)
ALT: 11 U/L (ref 0–55)
AST: 18 U/L (ref 5–34)
Albumin: 3.3 g/dL — ABNORMAL LOW (ref 3.5–5.0)
Alkaline Phosphatase: 61 U/L (ref 40–150)
Anion Gap: 8 mEq/L (ref 3–11)
BUN: 20.2 mg/dL (ref 7.0–26.0)
CHLORIDE: 109 meq/L (ref 98–109)
CO2: 27 meq/L (ref 22–29)
Calcium: 9.2 mg/dL (ref 8.4–10.4)
Creatinine: 1.4 mg/dL — ABNORMAL HIGH (ref 0.7–1.3)
GLUCOSE: 108 mg/dL (ref 70–140)
Potassium: 3.4 mEq/L — ABNORMAL LOW (ref 3.5–5.1)
Sodium: 144 mEq/L (ref 136–145)
TOTAL PROTEIN: 6.6 g/dL (ref 6.4–8.3)
Total Bilirubin: 1.01 mg/dL (ref 0.20–1.20)

## 2014-01-08 LAB — CBC WITH DIFFERENTIAL/PLATELET
BASO%: 0.5 % (ref 0.0–2.0)
Basophils Absolute: 0 10*3/uL (ref 0.0–0.1)
EOS ABS: 0 10*3/uL (ref 0.0–0.5)
EOS%: 0.8 % (ref 0.0–7.0)
HEMATOCRIT: 38.4 % (ref 38.4–49.9)
HGB: 12.1 g/dL — ABNORMAL LOW (ref 13.0–17.1)
LYMPH%: 19.2 % (ref 14.0–49.0)
MCH: 29.3 pg (ref 27.2–33.4)
MCHC: 31.5 g/dL — ABNORMAL LOW (ref 32.0–36.0)
MCV: 93.1 fL (ref 79.3–98.0)
MONO#: 0.3 10*3/uL (ref 0.1–0.9)
MONO%: 8.6 % (ref 0.0–14.0)
NEUT%: 70.9 % (ref 39.0–75.0)
NEUTROS ABS: 2.6 10*3/uL (ref 1.5–6.5)
Platelets: 110 10*3/uL — ABNORMAL LOW (ref 140–400)
RBC: 4.12 10*6/uL — ABNORMAL LOW (ref 4.20–5.82)
RDW: 14 % (ref 11.0–14.6)
WBC: 3.6 10*3/uL — AB (ref 4.0–10.3)
lymph#: 0.7 10*3/uL — ABNORMAL LOW (ref 0.9–3.3)

## 2014-01-08 NOTE — Progress Notes (Signed)
Hematology and Oncology Follow Up Visit  Shane [name] [id number] 14-Dec-1926 80 y.o. 01/08/2014 12:57 PM   Principle Diagnosis:This is an 80 year old gentleman with prostate cancer diagnosed in 41.  He currently has metastatic disease that is asymptomatic.  Prior Therapy: 1. Status post prostatectomy and lymphadenectomy.  He had involvement of the seminal vesicles. 2. Patient treated with adjuvant radiation therapy. 3. Patient developed recurrent disease treated with Lupron and subsequently with Lupron and Casodex and Casodex withdrawal. 4. Patient treated with second-line hormonal manipulation, including ketoconazole, prednisone and subsequently had relapse with PSA up to 47. 5. Received Provenge immunotherapy.  He had treated with 3 Provenge infusions, the last of which was given on 12/17/2010.    Current therapy: Currently on hormonal deprivation 30 mg as needed if his testosterone is more than 20.  Zytiga 1000 mg daily with Prednisone beginning in April 2013.  He continues to have excellent response with PSA.  Interim History:  Shane [name] presents today for a followup visit by himself. He continues on Zytiga without any complications. His blood pressure has been under control and his medications have been adjusted by his primary care physician. He reports no new symptoms since his last visit. No abdominal pain, nausea, or vomiting. He has continued to be asymptomatic from his disease. He did not report chest pain or difficulty breathing. Did not report any genitourinary complaints.  Performance status and activity level remain excellent at this time.  No recent hospitalizations or illnesses. No bone pain and actually his energy is better. Is not reporting any lower extremity edema or decline in his performance status.  He has not reported any constitutional symptoms or any complications related to prednisone. He has not reported any new complications or any changes in his quality of life.  Rest of his review of systems unremarkable.  Medications: I have reviewed the patient's current medications.  Current Outpatient Prescriptions  Medication Sig Dispense Refill  . abiraterone Acetate (ZYTIGA) 250 MG tablet Take 4 tablets (1000mg ) by mouth daily.  Take on empty stomach 1 hour before or 2 hours after a meal.  120 tablet  0  . amLODipine (NORVASC) 5 MG tablet Take 5 mg by mouth daily. Patient takes 1/2 tablet daily      . aspirin 81 MG tablet Take 81 mg by mouth daily.      Marland Kitchen atorvastatin (LIPITOR) 20 MG tablet Take 20 mg by mouth daily.      . cholecalciferol (D-VI-SOL) 400 UNIT/ML LIQD Take 400 Units by mouth daily.      . [facility] SENNA PLUS 8.6-50 MG per tablet TAKE 2 TABLETS BY MOUTH 2 (TWO) TIMES DAILY.  180 tablet  2  . erythromycin ophthalmic ointment Place into the left eye 4 (four) times daily. Apply 1/2 inch to left eye 4 times a day for 5 days.  3.5 g  0  . gemfibrozil (LOPID) 600 MG tablet Take 600 mg by mouth 2 (two) times daily before a meal.      . glipiZIDE (GLUCOTROL) 5 MG tablet Take 5 mg by mouth 2 (two) times daily before a meal.      . Lactulose 20 GM/30ML SOLN Take 30 mLs (20 g total) by mouth 2 (two) times daily as needed (For constipation).  500 mL  2  . losartan-hydrochlorothiazide (HYZAAR) 100-25 MG per tablet Take 1 tablet by mouth daily.      . metoCLOPramide (REGLAN) 10 MG tablet Take 10 mg by mouth 4 (four) times daily.      Marland Kitchen  omeprazole (PRILOSEC) 20 MG capsule Take 20 mg by mouth daily.      . predniSONE (DELTASONE) 5 MG tablet TAKE 1 TABLET BY MOUTH DAILY  90 tablet  0  . sulindac (CLINORIL) 200 MG tablet Take 200 mg by mouth 2 (two) times daily.      . vitamin C (ASCORBIC ACID) 500 MG tablet Take 500 mg by mouth daily.       No current facility-administered medications for this visit.     Allergies: No Known Allergies  Past Medical History, Surgical history, Social history, and Family History were reviewed and updated.    Physical Exam: Blood  pressure 136/45, pulse 47, temperature 98.5 F (36.9 C), temperature source Oral, resp. rate 20, height 5\' 5"  (1.651 m), weight 156 lb 12.8 oz (71.124 kg). ECOG: 1 General appearance: alert Head: Normocephalic, without obvious abnormality, atraumatic Neck: no adenopathy Lymph nodes: Cervical, supraclavicular, and axillary nodes normal. Heart:regular rate and rhythm, S1, S2 normal, no murmur, click, rub or gallop Lung:chest clear, no wheezing, rales, normal symmetric air entry Abdomen: soft, non-tender, without masses or organomegaly EXT:no erythema, induration, or nodules Neuro: no deficits noted.   Lab Results: Lab Results  Component Value Date   WBC 3.6* 01/08/2014   HGB 12.1* 01/08/2014   HCT 38.4 01/08/2014   MCV 93.1 01/08/2014   PLT 110* 01/08/2014      Results for Shane [name], Shane (MRN [id number]) as of 01/08/2014 12:49  Ref. Range 07/19/2013 12:30 09/18/2013 13:17 11/13/2013 12:45  PSA Latest Range: <=4.00 ng/mL 0.06 0.07 0.10      Impression and Plan: This is a pleasant 80 year old gentleman with the following issues: 1. Castration-resistant prostate cancer.  He is status post Provenge immunotherapy but currently on Zytiga and he is tolerating it well. His PSA continues to be very low close to 0.10. He continues to [place] well and an excellent PSA response that is durable. I plan on continuing the current dose and schedule. 2. Androgen deprivation: His last testosterone from 06/2012 indicated a castrate level we will consider androgen deprivation retreatment in the future.  3. Hypertension: His blood pressure is under control and medications have been adjusted. 4. Hypokalemia: Potassium level is slightly low and he is on potassium supplements. 5. Constipation: doing better from this stand point. He uses over-the-counter medication with good response 6. Follow-up: In 8 weeks.  HALPFX,TKWIO 9/30/201512:57 PM

## 2014-01-08 NOTE — Telephone Encounter (Signed)
, °

## 2014-01-09 LAB — PSA: PSA: 0.14 ng/mL (ref <=4.00)

## 2014-01-21 ENCOUNTER — Other Ambulatory Visit: Payer: Self-pay | Admitting: *Deleted

## 2014-01-21 DIAGNOSIS — C61 Malignant neoplasm of prostate: Secondary | ICD-10-CM

## 2014-01-21 MED ORDER — PREDNISONE 5 MG PO TABS: ORAL_TABLET | ORAL | Status: DC

## 2014-01-27 ENCOUNTER — Other Ambulatory Visit: Payer: Self-pay | Admitting: Oncology

## 2014-01-27 DIAGNOSIS — C61 Malignant neoplasm of prostate: Secondary | ICD-10-CM

## 2014-02-21 ENCOUNTER — Other Ambulatory Visit: Payer: Self-pay | Admitting: Oncology

## 2014-03-05 ENCOUNTER — Telehealth: Payer: Self-pay | Admitting: Oncology

## 2014-03-05 ENCOUNTER — Ambulatory Visit (HOSPITAL_BASED_OUTPATIENT_CLINIC_OR_DEPARTMENT_OTHER): Payer: Medicare Other | Admitting: Oncology

## 2014-03-05 ENCOUNTER — Other Ambulatory Visit (HOSPITAL_BASED_OUTPATIENT_CLINIC_OR_DEPARTMENT_OTHER): Payer: Medicare Other

## 2014-03-05 VITALS — BP 151/44 | HR 51 | Temp 97.9°F | Resp 18 | Wt 159.6 lb

## 2014-03-05 DIAGNOSIS — K5909 Other constipation: Secondary | ICD-10-CM

## 2014-03-05 DIAGNOSIS — C61 Malignant neoplasm of prostate: Secondary | ICD-10-CM

## 2014-03-05 DIAGNOSIS — E291 Testicular hypofunction: Secondary | ICD-10-CM

## 2014-03-05 DIAGNOSIS — I10 Essential (primary) hypertension: Secondary | ICD-10-CM

## 2014-03-05 DIAGNOSIS — E876 Hypokalemia: Secondary | ICD-10-CM

## 2014-03-05 LAB — CBC WITH DIFFERENTIAL/PLATELET
BASO%: 0.6 % (ref 0.0–2.0)
BASOS ABS: 0 10*3/uL (ref 0.0–0.1)
EOS%: 1 % (ref 0.0–7.0)
Eosinophils Absolute: 0.1 10*3/uL (ref 0.0–0.5)
HEMATOCRIT: 38.9 % (ref 38.4–49.9)
HEMOGLOBIN: 12.2 g/dL — AB (ref 13.0–17.1)
LYMPH#: 0.7 10*3/uL — AB (ref 0.9–3.3)
LYMPH%: 11 % — ABNORMAL LOW (ref 14.0–49.0)
MCH: 29.5 pg (ref 27.2–33.4)
MCHC: 31.4 g/dL — ABNORMAL LOW (ref 32.0–36.0)
MCV: 94.1 fL (ref 79.3–98.0)
MONO#: 0.3 10*3/uL (ref 0.1–0.9)
MONO%: 5.6 % (ref 0.0–14.0)
NEUT#: 5.1 10*3/uL (ref 1.5–6.5)
NEUT%: 81.8 % — AB (ref 39.0–75.0)
Platelets: 101 10*3/uL — ABNORMAL LOW (ref 140–400)
RBC: 4.13 10*6/uL — ABNORMAL LOW (ref 4.20–5.82)
RDW: 14.4 % (ref 11.0–14.6)
WBC: 6.2 10*3/uL (ref 4.0–10.3)

## 2014-03-05 LAB — COMPREHENSIVE METABOLIC PANEL (CC13)
ALT: 17 U/L (ref 0–55)
ANION GAP: 9 meq/L (ref 3–11)
AST: 21 U/L (ref 5–34)
Albumin: 3.5 g/dL (ref 3.5–5.0)
Alkaline Phosphatase: 63 U/L (ref 40–150)
BUN: 21.5 mg/dL (ref 7.0–26.0)
CALCIUM: 9.3 mg/dL (ref 8.4–10.4)
CHLORIDE: 106 meq/L (ref 98–109)
CO2: 27 meq/L (ref 22–29)
CREATININE: 1.3 mg/dL (ref 0.7–1.3)
Glucose: 118 mg/dl (ref 70–140)
Potassium: 4 mEq/L (ref 3.5–5.1)
Sodium: 143 mEq/L (ref 136–145)
Total Bilirubin: 0.86 mg/dL (ref 0.20–1.20)
Total Protein: 6.5 g/dL (ref 6.4–8.3)

## 2014-03-05 NOTE — Telephone Encounter (Signed)
gv adn printed appt sched and avs for pt for Jan 2016 °

## 2014-03-05 NOTE — Progress Notes (Signed)
Hematology and Oncology Follow Up Visit  Shane [name] [id number] [birth year] 80 y.o. 03/05/2014 1:12 PM   Principle Diagnosis:This is an 80 year old gentleman with prostate cancer diagnosed in 84.  He currently has metastatic disease that is asymptomatic.  Prior Therapy: 1. Status post prostatectomy and lymphadenectomy.  He had involvement of the seminal vesicles. 2. Patient treated with adjuvant radiation therapy. 3. Patient developed recurrent disease treated with Lupron and subsequently with Lupron and Casodex and Casodex withdrawal. 4. Patient treated with second-line hormonal manipulation, including ketoconazole, prednisone and subsequently had relapse with PSA up to 47. 5. Received Provenge immunotherapy.  He had treated with 3 Provenge infusions, the last of which was given on 12/17/2010.    Current therapy: Currently on hormonal deprivation 30 mg as needed if his testosterone is more than 20.  Zytiga 1000 mg daily with Prednisone beginning in April 2013.  He continues to have excellent response with PSA.  Interim History:  Shane [name] presents today for a followup visit by himself. Since the last visit, he is not reporting any new complaints. He continues on Zytiga without any complications. His blood pressure has been under control. He reports improved appetite and using nutritional supplements on a regular basis. He has gained close to 3 pounds in the last 6 months. He does not report any abdominal pain, nausea, or vomiting. He has continued to be asymptomatic from his disease. He did not report chest pain or difficulty breathing. Did not report any genitourinary complaints.  Performance status and activity level remain excellent at this time.  No recent hospitalizations or illnesses. No bone pain and actually his energy is better. Is not reporting any lower extremity edema or decline in his performance status.  He has not reported any constitutional symptoms or any complications  related to prednisone. He has not reported any new complications or any changes in his quality of life. Rest of his review of systems unremarkable.  Medications: I have reviewed the patient's current medications.  Current Outpatient Prescriptions  Medication Sig Dispense Refill  . aspirin 81 MG tablet Take 81 mg by mouth daily.    Marland Kitchen atorvastatin (LIPITOR) 20 MG tablet Take 20 mg by mouth daily.    . cholecalciferol (D-VI-SOL) 400 UNIT/ML LIQD Take 400 Units by mouth daily.    . [facility] SENNA PLUS 8.6-50 MG per tablet TAKE 2 TABLETS BY MOUTH TWICE A DAY 180 tablet 2  . erythromycin ophthalmic ointment Place into the left eye 4 (four) times daily. Apply 1/2 inch to left eye 4 times a day for 5 days. 3.5 g 0  . FLUZONE HIGH-DOSE 0.5 ML SUSY   0  . gemfibrozil (LOPID) 600 MG tablet Take 600 mg by mouth 2 (two) times daily before a meal.    . glipiZIDE (GLUCOTROL) 5 MG tablet Take 5 mg by mouth 2 (two) times daily before a meal.    . Lactulose 20 GM/30ML SOLN Take 30 mLs (20 g total) by mouth 2 (two) times daily as needed (For constipation). 500 mL 2  . losartan-hydrochlorothiazide (HYZAAR) 100-25 MG per tablet Take 1 tablet by mouth daily.    . metoCLOPramide (REGLAN) 10 MG tablet Take 10 mg by mouth 4 (four) times daily.    Marland Kitchen omeprazole (PRILOSEC) 20 MG capsule Take 20 mg by mouth daily.    . predniSONE (DELTASONE) 5 MG tablet TAKE 1 TABLET BY MOUTH DAILY 90 tablet 0  . risedronate (ACTONEL) 35 MG tablet   5  . sulindac (CLINORIL)  200 MG tablet Take 200 mg by mouth daily.     . vitamin C (ASCORBIC ACID) 500 MG tablet Take 500 mg by mouth daily.    Marland Kitchen ZYTIGA 250 MG tablet TAKE 4 TABLETS BY MOUTH DAILY ON AN EMPTY STOMACH 1 HOUR BEFORE OR 2 HOURS AFTER A MEAL 120 tablet 1   No current facility-administered medications for this visit.     Allergies: No Known Allergies  Past Medical History, Surgical history, Social history, and Family History were reviewed and updated.    Physical Exam: Blood  pressure 151/44, pulse 51, temperature 97.9 F (36.6 C), resp. rate 18, weight 159 lb 9.6 oz (72.394 kg). ECOG: 1 General appearance: alert awake not in any distress. Head: Normocephalic, without obvious abnormality Neck: no adenopathy Lymph nodes: Cervical, supraclavicular, and axillary nodes normal. Heart:regular rate and rhythm, S1, S2 normal, no murmur, click, rub or gallop Lung:chest clear, no wheezing, rales, normal symmetric air entry Abdomen: soft, non-tender, without masses or organomegaly EXT:no erythema, induration, or nodules Neuro: no deficits noted.   Lab Results: Lab Results  Component Value Date   WBC 6.2 03/05/2014   HGB 12.2* 03/05/2014   HCT 38.9 03/05/2014   MCV 94.1 03/05/2014   PLT 101* 03/05/2014     Results for Shane [name] (MRN [id number]) as of 03/05/2014 13:05  Ref. Range 09/18/2013 13:17 11/13/2013 12:45 01/08/2014 12:12  PSA Latest Range: <=4.00 ng/mL 0.07 0.10 0.14        Impression and Plan: This is a pleasant 80 year old gentleman with the following issues: 1. Castration-resistant prostate cancer.  He is status post Provenge immunotherapy. He is currently on Zytiga and he is tolerating it well. His PSA continues to be very low but have shown slight increase over the last year or so. The plan is to continue on the current dose and schedule and will continue to monitor his PSA closely. 2. Androgen deprivation: His last testosterone from 06/2012 indicated a castrate level we will consider androgen deprivation retreatment in the future. I will recheck his testosterone the next visit and institute Lupron as needed. 3. Hypertension: His blood pressure is under control and medications have been adjusted. 4. Hypokalemia: Potassium level is slightly low and he is on potassium supplements. This will be repeated today. 5. Constipation: doing better from this stand point. He uses over-the-counter medication with good response 6. Follow-up: In 8  weeks.  Shane [name] 11/25/20151:12 PM

## 2014-03-06 LAB — PSA: PSA: 0.2 ng/mL (ref <=4.00)

## 2014-03-26 ENCOUNTER — Other Ambulatory Visit: Payer: Self-pay | Admitting: Oncology

## 2014-04-24 ENCOUNTER — Other Ambulatory Visit: Payer: Self-pay | Admitting: Oncology

## 2014-04-30 ENCOUNTER — Telehealth: Payer: Self-pay | Admitting: Oncology

## 2014-04-30 ENCOUNTER — Ambulatory Visit (HOSPITAL_BASED_OUTPATIENT_CLINIC_OR_DEPARTMENT_OTHER): Payer: Medicare Other | Admitting: Oncology

## 2014-04-30 ENCOUNTER — Other Ambulatory Visit (HOSPITAL_BASED_OUTPATIENT_CLINIC_OR_DEPARTMENT_OTHER): Payer: Medicare Other

## 2014-04-30 VITALS — BP 172/58 | HR 47 | Temp 98.1°F | Resp 18 | Ht 65.0 in | Wt 155.6 lb

## 2014-04-30 DIAGNOSIS — K5909 Other constipation: Secondary | ICD-10-CM

## 2014-04-30 DIAGNOSIS — I10 Essential (primary) hypertension: Secondary | ICD-10-CM

## 2014-04-30 DIAGNOSIS — E291 Testicular hypofunction: Secondary | ICD-10-CM

## 2014-04-30 DIAGNOSIS — C61 Malignant neoplasm of prostate: Secondary | ICD-10-CM

## 2014-04-30 DIAGNOSIS — E876 Hypokalemia: Secondary | ICD-10-CM

## 2014-04-30 LAB — COMPREHENSIVE METABOLIC PANEL (CC13)
ALBUMIN: 3.4 g/dL — AB (ref 3.5–5.0)
ALK PHOS: 59 U/L (ref 40–150)
ALT: 15 U/L (ref 0–55)
AST: 19 U/L (ref 5–34)
Anion Gap: 8 mEq/L (ref 3–11)
BUN: 20.7 mg/dL (ref 7.0–26.0)
CO2: 29 mEq/L (ref 22–29)
Calcium: 8.6 mg/dL (ref 8.4–10.4)
Chloride: 107 mEq/L (ref 98–109)
Creatinine: 1.3 mg/dL (ref 0.7–1.3)
EGFR: 58 mL/min/{1.73_m2} — ABNORMAL LOW (ref >90)
Glucose: 81 mg/dl (ref 70–140)
Potassium: 4.2 mEq/L (ref 3.5–5.1)
Sodium: 143 mEq/L (ref 136–145)
Total Bilirubin: 0.8 mg/dL (ref 0.20–1.20)
Total Protein: 6.6 g/dL (ref 6.4–8.3)

## 2014-04-30 LAB — CBC WITH DIFFERENTIAL/PLATELET
BASO%: 0.5 % (ref 0.0–2.0)
Basophils Absolute: 0 10*3/uL (ref 0.0–0.1)
EOS ABS: 0 10*3/uL (ref 0.0–0.5)
EOS%: 0.3 % (ref 0.0–7.0)
HCT: 38 % — ABNORMAL LOW (ref 38.4–49.9)
HEMOGLOBIN: 11.6 g/dL — AB (ref 13.0–17.1)
LYMPH#: 0.9 10*3/uL (ref 0.9–3.3)
LYMPH%: 18.8 % (ref 14.0–49.0)
MCH: 28.9 pg (ref 27.2–33.4)
MCHC: 30.5 g/dL — ABNORMAL LOW (ref 32.0–36.0)
MCV: 94.7 fL (ref 79.3–98.0)
MONO#: 0.5 10*3/uL (ref 0.1–0.9)
MONO%: 11.7 % (ref 0.0–14.0)
NEUT%: 68.7 % (ref 39.0–75.0)
NEUTROS ABS: 3.1 10*3/uL (ref 1.5–6.5)
Platelets: 108 10*3/uL — ABNORMAL LOW (ref 140–400)
RBC: 4.02 10*6/uL — AB (ref 4.20–5.82)
RDW: 13.5 % (ref 11.0–14.6)
WBC: 4.6 10*3/uL (ref 4.0–10.3)

## 2014-04-30 NOTE — Progress Notes (Signed)
Hematology and Oncology Follow Up Visit  Shane [name] [id number] 03-23-1927 81 y.o. 04/30/2014 2:20 PM   Principle Diagnosis:This is an 81 year old gentleman with prostate cancer diagnosed in 5.  He currently has metastatic disease that is asymptomatic.  Prior Therapy: 1. Status post prostatectomy and lymphadenectomy.  He had involvement of the seminal vesicles. 2. Patient treated with adjuvant radiation therapy. 3. Patient developed recurrent disease treated with Lupron and subsequently with Lupron and Casodex and Casodex withdrawal. 4. Patient treated with second-line hormonal manipulation, including ketoconazole, prednisone and subsequently had relapse with PSA up to 47. 5. Received Provenge immunotherapy.  He had treated with 3 Provenge infusions, the last of which was given on 12/17/2010.    Current therapy: Currently on hormonal deprivation 30 mg as needed if his testosterone is more than 20.  Zytiga 1000 mg daily with Prednisone beginning in April 2013.  He continues to have excellent response with PSA.  Interim History:  Mr. Shane [name] presents today for a followup visit by himself. Since the last visit, he continues to do very well. He has no new complaints. He continues on Zytiga without any new side effects. He does have occasional constipation and MiraLAX help with his symptoms. He does not report any abdominal pain, nausea, or vomiting.   He did not report chest pain or difficulty breathing. Did not report any genitourinary complaints.  Performance status and activity level remain excellent at this time.  No recent hospitalizations or illnesses. No bone pain and actually his energy is better. Is not reporting any lower extremity edema or decline in his performance status.  He has not reported any constitutional symptoms or any complications related to prednisone. He has not reported any new complications or any changes in his quality of life. Rest of his review of systems  unremarkable.  Medications: I have reviewed the patient's current medications.  Current Outpatient Prescriptions  Medication Sig Dispense Refill  . aspirin 81 MG tablet Take 81 mg by mouth daily.    Marland Kitchen atorvastatin (LIPITOR) 20 MG tablet Take 20 mg by mouth daily.    . cholecalciferol (D-VI-SOL) 400 UNIT/ML LIQD Take 400 Units by mouth daily.    . [facility] SENNA PLUS 8.6-50 MG per tablet TAKE 2 TABLETS BY MOUTH TWICE A DAY 180 tablet 2  . erythromycin ophthalmic ointment Place into the left eye 4 (four) times daily. Apply 1/2 inch to left eye 4 times a day for 5 days. 3.5 g 0  . FLUZONE HIGH-DOSE 0.5 ML SUSY   0  . gemfibrozil (LOPID) 600 MG tablet Take 600 mg by mouth 2 (two) times daily before a meal.    . glipiZIDE (GLUCOTROL) 5 MG tablet Take 5 mg by mouth 2 (two) times daily before a meal.    . Lactulose 20 GM/30ML SOLN Take 30 mLs (20 g total) by mouth 2 (two) times daily as needed (For constipation). 500 mL 2  . losartan-hydrochlorothiazide (HYZAAR) 100-25 MG per tablet Take 1 tablet by mouth daily.    . metoCLOPramide (REGLAN) 10 MG tablet Take 10 mg by mouth 4 (four) times daily.    Marland Kitchen omeprazole (PRILOSEC) 20 MG capsule Take 20 mg by mouth daily.    . predniSONE (DELTASONE) 5 MG tablet TAKE 1 TABLET BY MOUTH DAILY 90 tablet 0  . risedronate (ACTONEL) 35 MG tablet   5  . sulindac (CLINORIL) 200 MG tablet Take 200 mg by mouth daily.     . vitamin C (ASCORBIC ACID) 500 MG tablet  Take 500 mg by mouth daily.    Marland Kitchen ZYTIGA 250 MG tablet TAKE 4 TABLETS BY MOUTH DAILY ON AN EMPTY STOMACH 1 HOUR BEFORE OR 2 HOURS AFTER A MEAL 120 tablet 1   No current facility-administered medications for this visit.     Allergies: No Known Allergies  Past Medical History, Surgical history, Social history, and Family History were reviewed and updated.    Physical Exam: Blood pressure 172/58, pulse 47, temperature 98.1 F (36.7 C), temperature source Oral, resp. rate 18, height 5\' 5"  (1.651 m), weight 155  lb 9.6 oz (70.58 kg). ECOG: 1 General appearance: alert awake not in any distress. Head: Normocephalic, without obvious abnormality Neck: no adenopathy Lymph nodes: Cervical, supraclavicular, and axillary nodes normal. Heart:regular rate and rhythm, S1, S2 normal, no murmur, click, rub or gallop Lung:chest clear, no wheezing, rales, normal symmetric air entry Abdomen: soft, non-tender, without masses or organomegaly EXT:no erythema, induration, or nodules Neuro: no deficits noted.   Lab Results: Lab Results  Component Value Date   WBC 4.6 04/30/2014   HGB 11.6* 04/30/2014   HCT 38.0* 04/30/2014   MCV 94.7 04/30/2014   PLT 108* 04/30/2014       Results for Truluck, BAYLON (MRN [id number]) as of 04/30/2014 14:14  Ref. Range 11/13/2013 12:45 01/08/2014 12:12 03/05/2014 12:35  PSA Latest Range: <=4.00 ng/mL 0.10 0.14 0.20       Impression and Plan: This is a pleasant 81 year old gentleman with the following issues: 1. Castration-resistant prostate cancer.  He is status post Provenge immunotherapy. He is currently on Zytiga and he is tolerating it well. His PSA continues to be very low but have shown slight increase over the last year or so. His doubling time is approaching less than 6 months but he continues to be asymptomatic. We will continue to monitor his PSA and if he develops rapid progression, we will restage him and we'll consider different salvage regimen. 2. Androgen deprivation: His last testosterone from 06/2012 indicated a castrate level. I will check his testosterone the next visit and determine whether he needs retreatment with Lupron. 3. Hypertension: His blood pressure is under control and medications have been adjusted. 4. Hypokalemia: Potassium level is normal today. 5. Constipation: doing better from this stand point. MiraLAX seems to have helped his symptoms. 6. Follow-up: In 8 weeks.  [name] 1/20/20162:20 PM

## 2014-04-30 NOTE — Telephone Encounter (Signed)
Gave avs & calendar for March. °

## 2014-05-01 LAB — TESTOSTERONE: Testosterone: <10 ng/dL — ABNORMAL LOW (ref 300–890)

## 2014-05-01 LAB — PSA: PSA: 0.4 ng/mL (ref <=4.00)

## 2014-05-28 ENCOUNTER — Other Ambulatory Visit (INDEPENDENT_AMBULATORY_CARE_PROVIDER_SITE_OTHER): Payer: Self-pay | Admitting: General Surgery

## 2014-05-29 ENCOUNTER — Other Ambulatory Visit: Payer: Self-pay | Admitting: Oncology

## 2014-06-21 ENCOUNTER — Other Ambulatory Visit: Payer: Self-pay | Admitting: Oncology

## 2014-06-24 ENCOUNTER — Other Ambulatory Visit (HOSPITAL_BASED_OUTPATIENT_CLINIC_OR_DEPARTMENT_OTHER): Payer: Medicare Other

## 2014-06-24 ENCOUNTER — Ambulatory Visit (HOSPITAL_BASED_OUTPATIENT_CLINIC_OR_DEPARTMENT_OTHER): Payer: Medicare Other | Admitting: Oncology

## 2014-06-24 ENCOUNTER — Telehealth: Payer: Self-pay | Admitting: Oncology

## 2014-06-24 VITALS — BP 134/51 | HR 46 | Temp 98.4°F | Resp 16 | Ht 65.0 in | Wt 156.3 lb

## 2014-06-24 DIAGNOSIS — C61 Malignant neoplasm of prostate: Secondary | ICD-10-CM

## 2014-06-24 DIAGNOSIS — E876 Hypokalemia: Secondary | ICD-10-CM

## 2014-06-24 DIAGNOSIS — E291 Testicular hypofunction: Secondary | ICD-10-CM | POA: Diagnosis not present

## 2014-06-24 DIAGNOSIS — I10 Essential (primary) hypertension: Secondary | ICD-10-CM

## 2014-06-24 LAB — CBC WITH DIFFERENTIAL/PLATELET
BASO%: 0.3 % (ref 0.0–2.0)
Basophils Absolute: 0 10*3/uL (ref 0.0–0.1)
EOS%: 1.3 % (ref 0.0–7.0)
Eosinophils Absolute: 0.1 10*3/uL (ref 0.0–0.5)
HCT: 38.1 % — ABNORMAL LOW (ref 38.4–49.9)
HGB: 12.5 g/dL — ABNORMAL LOW (ref 13.0–17.1)
LYMPH%: 21.8 % (ref 14.0–49.0)
MCH: 30.1 pg (ref 27.2–33.4)
MCHC: 32.8 g/dL (ref 32.0–36.0)
MCV: 91.8 fL (ref 79.3–98.0)
MONO#: 0.3 10*3/uL (ref 0.1–0.9)
MONO%: 7.8 % (ref 0.0–14.0)
NEUT%: 68.8 % (ref 39.0–75.0)
NEUTROS ABS: 2.7 10*3/uL (ref 1.5–6.5)
PLATELETS: 98 10*3/uL — AB (ref 140–400)
RBC: 4.15 10*6/uL — AB (ref 4.20–5.82)
RDW: 13.7 % (ref 11.0–14.6)
WBC: 3.9 10*3/uL — ABNORMAL LOW (ref 4.0–10.3)
lymph#: 0.8 10*3/uL — ABNORMAL LOW (ref 0.9–3.3)

## 2014-06-24 LAB — COMPREHENSIVE METABOLIC PANEL (CC13)
ALK PHOS: 52 U/L (ref 40–150)
ALT: 15 U/L (ref 0–55)
AST: 18 U/L (ref 5–34)
Albumin: 3.2 g/dL — ABNORMAL LOW (ref 3.5–5.0)
Anion Gap: 11 mEq/L (ref 3–11)
BILIRUBIN TOTAL: 0.74 mg/dL (ref 0.20–1.20)
BUN: 17.4 mg/dL (ref 7.0–26.0)
CHLORIDE: 106 meq/L (ref 98–109)
CO2: 26 mEq/L (ref 22–29)
Calcium: 9 mg/dL (ref 8.4–10.4)
Creatinine: 1.3 mg/dL (ref 0.7–1.3)
EGFR: 55 mL/min/{1.73_m2} — ABNORMAL LOW (ref >90)
Glucose: 138 mg/dl (ref 70–140)
Potassium: 3.9 mEq/L (ref 3.5–5.1)
Sodium: 144 mEq/L (ref 136–145)
Total Protein: 6.4 g/dL (ref 6.4–8.3)

## 2014-06-24 NOTE — Progress Notes (Signed)
Hematology and Oncology Follow Up Visit  Shane [name] [id number] 31-Oct-1926 81 y.o. 06/24/2014 1:06 PM   Principle Diagnosis:This is an 81 year old gentleman with prostate cancer diagnosed in 38.  He currently has metastatic disease that is asymptomatic.  Prior Therapy: 1. Status post prostatectomy and lymphadenectomy.  He had involvement of the seminal vesicles. 2. Patient treated with adjuvant radiation therapy. 3. Patient developed recurrent disease treated with Lupron and subsequently with Lupron and Casodex and Casodex withdrawal. 4. Patient treated with second-line hormonal manipulation, including ketoconazole, prednisone and subsequently had relapse with PSA up to 47. 5. Received Provenge immunotherapy.  He had treated with 3 Provenge infusions, the last of which was given on 12/17/2010.    Current therapy: Currently on hormonal deprivation with Lupron30 mg as needed if his testosterone is more than 20.  Zytiga 1000 mg daily with Prednisone beginning in April 2013.  He continues to have excellent response with PSA.  Interim History:  Shane [name] presents today for a followup visit by himself. Since the last visit, he reports no issues. He  continues on Zytiga without any new side effects. He does have occasional constipation and MiraLAX help with his symptoms. He does not report any abdominal pain, nausea, or vomiting. He continues to be functional and enjoys excellent quality of life. He does report occasional low back pain very mild and has been intermittent in nature.  He did not report chest pain or difficulty breathing. Did not report any genitourinary complaints.  Performance status and activity level remain excellent at this time.  No recent hospitalizations or illnesses. No bone pain and actually his energy is better. Is not reporting any lower extremity edema or decline in his performance status.  He has not reported any constitutional symptoms or any complications related to  prednisone. He has not reported any new complications or any changes in his quality of life. Rest of his review of systems unremarkable.  Medications: I have reviewed the patient's current medications.  Current Outpatient Prescriptions  Medication Sig Dispense Refill  . aspirin 81 MG tablet Take 81 mg by mouth daily.    Marland Kitchen atorvastatin (LIPITOR) 20 MG tablet Take 20 mg by mouth daily.    . cholecalciferol (D-VI-SOL) 400 UNIT/ML LIQD Take 400 Units by mouth daily.    . [facility] SENNA PLUS 8.6-50 MG per tablet TAKE 2 TABLETS BY MOUTH 2 (TWO) TIMES DAILY. 180 tablet 2  . erythromycin ophthalmic ointment Place into the left eye 4 (four) times daily. Apply 1/2 inch to left eye 4 times a day for 5 days. 3.5 g 0  . FLUZONE HIGH-DOSE 0.5 ML SUSY   0  . gemfibrozil (LOPID) 600 MG tablet Take 600 mg by mouth 2 (two) times daily before a meal.    . glipiZIDE (GLUCOTROL) 5 MG tablet Take 5 mg by mouth 2 (two) times daily before a meal.    . Lactulose 20 GM/30ML SOLN Take 30 mLs (20 g total) by mouth 2 (two) times daily as needed (For constipation). 500 mL 2  . losartan-hydrochlorothiazide (HYZAAR) 100-25 MG per tablet Take 1 tablet by mouth daily.    . metoCLOPramide (REGLAN) 10 MG tablet Take 10 mg by mouth 4 (four) times daily.    Marland Kitchen omeprazole (PRILOSEC) 20 MG capsule Take 20 mg by mouth daily.    . predniSONE (DELTASONE) 5 MG tablet TAKE 1 TABLET BY MOUTH DAILY 90 tablet 0  . risedronate (ACTONEL) 35 MG tablet   5  . sulindac (CLINORIL)  200 MG tablet Take 200 mg by mouth daily.     . vitamin C (ASCORBIC ACID) 500 MG tablet Take 500 mg by mouth daily.    Marland Kitchen ZYTIGA 250 MG tablet TAKE 4 TABLETS BY MOUTH DAILY ON AN EMPTY STOMACH 1 HOUR BEFORE OR 2 HOURS AFTER A MEAL 120 tablet 1   No current facility-administered medications for this visit.     Allergies: No Known Allergies  Past Medical History, Surgical history, Social history, and Family History were reviewed and updated.    Physical Exam: Blood  pressure 134/51, pulse 46, temperature 98.4 F (36.9 C), temperature source Oral, resp. rate 16, height 5\' 5"  (1.651 m), weight 156 lb 4.8 oz (70.897 kg). ECOG: 1 General appearance: alert awake not in any distress. Head: Normocephalic, without obvious abnormality Neck: no adenopathy Lymph nodes: Cervical, supraclavicular, and axillary nodes normal. Heart:regular rate and rhythm, S1, S2 normal, no murmur, click, rub or gallop Lung:chest clear, no wheezing, rales, normal symmetric air entry Abdomen: soft, non-tender, without masses or organomegaly EXT:no erythema, induration, or nodules Neuro: no deficits noted.   Lab Results: Lab Results  Component Value Date   WBC 3.9* 06/24/2014   HGB 12.5* 06/24/2014   HCT 38.1* 06/24/2014   MCV 91.8 06/24/2014   PLT 98* 06/24/2014       Results for [name], Shane (MRN [id number]) as of 06/24/2014 13:00  Ref. Range 01/08/2014 12:12 03/05/2014 12:35 04/30/2014 13:51  PSA Latest Range: <=4.00 ng/mL 0.14 0.20 0.40        Impression and Plan: This is a pleasant 81 year old gentleman with the following issues: 1. Castration-resistant prostate cancer.  He is status post Provenge immunotherapy. He is currently on Zytiga and he is tolerating it well. His PSA continues to be very low but have shown slight increase over the last year or so. His doubling time is approaching less than 6 months but he continues to be asymptomatic. We will continue to monitor his PSA and we will use a different salvage therapy if his PSA starts to rise rapidly. 2. Androgen deprivation: His last testosterone on 04/2014 indicated a castrate level. I will check his testosterone in 6 months and repeat Lupron as needed. 3. Hypertension: His blood pressure is under control and medications have been adjusted. 4. Hypokalemia: Potassium level is normal today. 5. Constipation: doing better from this stand point. MiraLAX seems to have helped his symptoms. 6. Follow-up: In 8  weeks.  [facility] 3/15/20161:06 PM

## 2014-06-24 NOTE — Telephone Encounter (Signed)
Pt confirmed labs/ov per 03/15 POF, gave pt AVS and calendar.... KJ °

## 2014-06-25 LAB — PSA: PSA: 0.56 ng/mL (ref <=4.00)

## 2014-07-07 ENCOUNTER — Encounter (HOSPITAL_COMMUNITY): Payer: Self-pay

## 2014-07-07 ENCOUNTER — Encounter (HOSPITAL_COMMUNITY)
Admission: RE | Admit: 2014-07-07 | Discharge: 2014-07-07 | Disposition: A | Discharge status: Home / Self Care | Payer: Medicare Other | Source: Ambulatory Visit | Attending: General Surgery | Admitting: General Surgery

## 2014-07-07 DIAGNOSIS — Z01812 Encounter for preprocedural laboratory examination: Secondary | ICD-10-CM | POA: Diagnosis present

## 2014-07-07 DIAGNOSIS — K4090 Unilateral inguinal hernia, without obstruction or gangrene, not specified as recurrent: Secondary | ICD-10-CM | POA: Insufficient documentation

## 2014-07-07 HISTORY — DX: Cardiac arrhythmia, unspecified: I49.9

## 2014-07-07 HISTORY — DX: Gastro-esophageal reflux disease without esophagitis: K21.9

## 2014-07-07 HISTORY — DX: Constipation, unspecified: K59.00

## 2014-07-07 HISTORY — DX: Cardiac murmur, unspecified: R01.1

## 2014-07-07 LAB — URINALYSIS, ROUTINE W REFLEX MICROSCOPIC
Bilirubin Urine: NEGATIVE
Glucose, UA: NEGATIVE mg/dL
Hgb urine dipstick: NEGATIVE
Ketones, ur: NEGATIVE mg/dL
NITRITE: NEGATIVE
Protein, ur: NEGATIVE mg/dL
Specific Gravity, Urine: 1.016 (ref 1.005–1.030)
UROBILINOGEN UA: 0.2 mg/dL (ref 0.0–1.0)
pH: 5.5 (ref 5.0–8.0)

## 2014-07-07 LAB — CBC WITH DIFFERENTIAL/PLATELET
Basophils Absolute: 0 10*3/uL (ref 0.0–0.1)
Basophils Relative: 0 % (ref 0–1)
Eosinophils Absolute: 0 10*3/uL (ref 0.0–0.7)
Eosinophils Relative: 0 % (ref 0–5)
HCT: 37.6 % — ABNORMAL LOW (ref 39.0–52.0)
Hemoglobin: 12.3 g/dL — ABNORMAL LOW (ref 13.0–17.0)
LYMPHS ABS: 1.6 10*3/uL (ref 0.7–4.0)
Lymphocytes Relative: 21 % (ref 12–46)
MCH: 29.6 pg (ref 26.0–34.0)
MCHC: 32.7 g/dL (ref 30.0–36.0)
MCV: 90.4 fL (ref 78.0–100.0)
MONOS PCT: 7 % (ref 3–12)
Monocytes Absolute: 0.5 10*3/uL (ref 0.1–1.0)
Neutro Abs: 5.6 10*3/uL (ref 1.7–7.7)
Neutrophils Relative %: 72 % (ref 43–77)
Platelets: 108 10*3/uL — ABNORMAL LOW (ref 150–400)
RBC: 4.16 MIL/uL — AB (ref 4.22–5.81)
RDW: 14 % (ref 11.5–15.5)
WBC: 7.7 10*3/uL (ref 4.0–10.5)

## 2014-07-07 LAB — COMPREHENSIVE METABOLIC PANEL
ALT: 16 U/L (ref 0–53)
ANION GAP: 11 (ref 5–15)
AST: 27 U/L (ref 0–37)
Albumin: 3.3 g/dL — ABNORMAL LOW (ref 3.5–5.2)
Alkaline Phosphatase: 54 U/L (ref 39–117)
BILIRUBIN TOTAL: 0.9 mg/dL (ref 0.3–1.2)
BUN: 20 mg/dL (ref 6–23)
CHLORIDE: 105 mmol/L (ref 96–112)
CO2: 24 mmol/L (ref 19–32)
Calcium: 8.6 mg/dL (ref 8.4–10.5)
Creatinine, Ser: 1.36 mg/dL — ABNORMAL HIGH (ref 0.50–1.35)
GFR, EST AFRICAN AMERICAN: 53 mL/min — AB (ref >90)
GFR, EST NON AFRICAN AMERICAN: 46 mL/min — AB (ref >90)
GLUCOSE: 109 mg/dL — AB (ref 70–99)
Potassium: 4.1 mmol/L (ref 3.5–5.1)
SODIUM: 140 mmol/L (ref 135–145)
Total Protein: 6.7 g/dL (ref 6.0–8.3)

## 2014-07-07 LAB — URINE MICROSCOPIC-ADD ON

## 2014-07-07 NOTE — Pre-Procedure Instructions (Signed)
Shane [name]  07/07/2014   Your procedure is scheduled on:  Monday, April 4.  Report to [facility] Admitting at 5:30AM.  Call this number if you have problems the morning of surgery: [phone]                         For any other questions, please call [phone], Monday - Friday 8 AM - 4 PM.   Remember:   Do not eat food or drink liquids after midnight Sunday, April 3.    Take these medicines the morning of surgery with A SIP OF WATER: gemfibrozil (LOPID), metoCLOPramide (REGLAN), omeprazole (PRILOSEC).               Stop taking Aspirin, Coumadin, Plavix, Effient and Herbal medications, Vitamins.  Do not take any NSAIDs : i.e.sulindac (CLINORIL):  Ibuprofen,  Advil,Naproxen or any medication containing Aspirin.   Do not wear jewelry, make-up or nail polish.  Do not wear lotions, powders, or perfumes.              Men may shave face and neck.  Do not bring valuables to the hospital.              [facility] is not responsible for any belongings or valuables.               Contacts, dentures or bridgework may not be worn into surgery.  Leave suitcase in the car. After surgery it may be brought to your room.  For patients admitted to the hospital, discharge time is determined by your treatment team.               Patients discharged the day of surgery will not be allowed to drive home.  Name and phone number of your driver: -   Special Instructions: Review  [place] - Preparing For Surgery.   Please read over the following fact sheets that you were given: Pain Booklet, Coughing and Deep Breathing and Surgical Site Infection Prevention

## 2014-07-07 NOTE — Pre-Procedure Instructions (Signed)
Burhanuddin Kohlmann  07/07/2014   Your procedure is scheduled on:  Monday, April 4.  Report to [facility] Admitting at 5:30AM.  Call this number if you have problems the morning of surgery: [phone]                         For any other questions, please call [phone], Monday - Friday 8 AM - 4 PM.   Remember:   Do not eat food or drink liquids after midnight Sunday, April 3.    Take these medicines the morning of surgery with A SIP OF WATER: gemfibrozil (LOPID), metoCLOPramide (REGLAN), omeprazole (PRILOSEC).               Stop taking Aspirin, Coumadin, Plavix, Effient and Herbal medications, Vitamins.  Do not take any NSAIDs ie: Ibuprofen,  Advil,Naproxen or any medication containing Aspirin.   Do not wear jewelry, make-up or nail polish.  Do not wear lotions, powders, or perfumes.              Men may shave face and neck.  Do not bring valuables to the hospital.              [facility] is not responsible for any belongings or valuables.               Contacts, dentures or bridgework may not be worn into surgery.  Leave suitcase in the car. After surgery it may be brought to your room.  For patients admitted to the hospital, discharge time is determined by your treatment team.               Patients discharged the day of surgery will not be allowed to drive home.  Name and phone number of your driver: -   Special Instructions: Review  [place] - Preparing For Surgery.   Please read over the following fact sheets that you were given: Pain Booklet, Coughing and Deep Breathing and Surgical Site Infection Prevention

## 2014-07-07 NOTE — Pre-Procedure Instructions (Signed)
Shane [name]  07/07/2014   Your procedure is scheduled on:  Monday, April 4.  Report to [facility] Admitting at 5:30AM.  Call this number if you have problems the morning of surgery: [phone]                         For any other questions, please call [phone], Monday - Friday 8 AM - 4 PM.   Remember:   Do not eat food or drink liquids after midnight Sunday, April 3.    Take these medicines the morning of surgery with A SIP OF WATER: gemfibrozil (LOPID), metoCLOPramide (REGLAN), omeprazole (PRILOSEC), ZYTIGA.              Stop taking Aspirin, Coumadin, Plavix, Effient and Herbal medications, Vitamins.  Do not take any NSAIDs : i.e.sulindac (CLINORIL):  Ibuprofen,  Advil,Naproxen or any medication containing Aspirin.   Do not wear jewelry, make-up or nail polish.  Do not wear lotions, powders, or perfumes.              Men may shave face and neck.  Do not bring valuables to the hospital.              [facility] is not responsible for any belongings or valuables.               Contacts, dentures or bridgework may not be worn into surgery.  Leave suitcase in the car. After surgery it may be brought to your room.  For patients admitted to the hospital, discharge time is determined by your treatment team.               Patients discharged the day of surgery will not be allowed to drive home.  Name and phone number of your driver: -   Special Instructions: Review  [place] - Preparing For Surgery.   Please read over the following fact sheets that you were given: Pain Booklet, Coughing and Deep Breathing and Surgical Site Infection Prevention

## 2014-07-08 ENCOUNTER — Encounter (HOSPITAL_COMMUNITY): Payer: Self-pay

## 2014-07-12 NOTE — H&P (Signed)
Shane [name]  Location: [facility] Patient #: [id number] DOB: 1926/04/24 Married / Language: English / Race: Black or African American Male  History of Present Illness    The patient is a 81 year old male who presents with an inguinal hernia. He was referred to me by Dr. Doylene Canard., his PCP, for management of a large right inguinal hernia. The patient states that he has had a right inguinal hernia for several years. It has been getting larger and he has decided he wants to have this repaired. Not much pain. No good history of incarceration. Dr. Merrilee Jansky records list a left inguinal hernia repair in the history, but the patient states he has never had a hernia repair in the past. Comorbidities include open prostatectomy by Dr. Janice Norrie for prostate cancer. Appendectomy 1947 cardiac catheterization 2005 with with normal coronaries. Takes Lipitor, Hyzaar, potassium, aspirin, amlodipine, vitamin D, Prilosec, Actonel, Reglan, gemfibrozil, calcium, garlic. No cardiac or pulmonary symptoms symptoms. Can walk up a flight of stairs without symptoms. Retired [employer]. I discussed the indications, details, techniques and numerous risk of right inguinal hernia repair with him and his wife. He is aware of the risks of bleeding, infection, recurrence, nerve damage with chronic pain, injury to the testicle or bladder or intestine was major reconstructive surgery. He understands all these issues. HIS questions are answered. He agrees with this plan. Addendum Note Review of Dr. Sammie Bench notes dated September 12, 2013 at [facility] reveal history of metastatic prostate cancer  Followed by Dr. Alen Blew. Also reveals history of insertion of penile prosthesis and history of retropubic radical prostatectomy with bilateral pelvic lymphadenopathy.    Other Problems Elbert Ewings, CMA; 05/28/2014 2:19 PM) Diabetes Mellitus Heart murmur High blood pressure Inguinal  Hernia  Past Surgical History  Appendectomy  Diagnostic Studies History Colonoscopy never  Allergies  No Known Drug Allergies02/17/2016  Medication History  Atorvastatin Calcium (20MG  Tablet, Oral) Active. [employer] Senna Plus (8.6-50MG  Tablet, Oral) Active. Fluzone High-Dose (0.5ML Susp Pref Syr, Intramuscular) Active. Gemfibrozil (600MG  Tablet, Oral) Active. GlipiZIDE ER (2.5MG  Tablet ER 24HR, Oral) Active. Lactulose (10GM/15ML Solution, Oral) Active. Losartan Potassium-HCTZ (100-25MG  Tablet, Oral) Active. Metoclopramide HCl (5MG  Tablet, Oral) Active. PredniSONE (5MG  Tablet, Oral) Active. Risedronate Sodium (35MG  Tablet, Oral) Active. Sulindac (200MG  Tablet, Oral) Active. Zytiga (250MG  Tablet, Oral) Active.  Social History Elbert Ewings, [place]; 05/28/2014 2:19 PM) Caffeine use Coffee. No alcohol use No drug use Tobacco use Former smoker.  Family History  Alcohol Abuse Brother. Arthritis Father, Mother. Diabetes Mellitus Mother. Hypertension Mother. Prostate Cancer Son.  Review of Systems  General Present- Night Sweats. Not Present- Appetite Loss, Chills, Fatigue, Fever, Weight Gain and Weight Loss. Skin Not Present- Change in Wart/Mole, Dryness, Hives, Jaundice, New Lesions, Non-Healing Wounds, Rash and Ulcer. HEENT Not Present- Earache, Hearing Loss, Hoarseness, Nose Bleed, Oral Ulcers, Ringing in the Ears, Seasonal Allergies, Sinus Pain, Sore Throat, Visual Disturbances, Wears glasses/contact lenses and Yellow Eyes. Respiratory Not Present- Bloody sputum, Chronic Cough, Difficulty Breathing, Snoring and Wheezing. Breast Not Present- Breast Mass, Breast Pain, Nipple Discharge and Skin Changes. Cardiovascular Present- Leg Cramps. Not Present- Chest Pain, Difficulty Breathing Lying Down, Palpitations, Rapid Heart Rate, Shortness of Breath and Swelling of Extremities. Gastrointestinal Present- Constipation. Not Present- Abdominal Pain, Bloating, Bloody  Stool, Change in Bowel Habits, Chronic diarrhea, Difficulty Swallowing, Excessive gas, Gets full quickly at meals, Hemorrhoids, Indigestion, Nausea, Rectal Pain and Vomiting. Musculoskeletal Not Present- Back Pain, Joint Pain, Joint Stiffness, Muscle Pain, Muscle Weakness and Swelling of Extremities. Neurological  Present- Decreased Memory. Not Present- Fainting, Headaches, Numbness, Seizures, Tingling, Tremor, Trouble walking and Weakness. Psychiatric Not Present- Anxiety, Bipolar, Change in Sleep Pattern, Depression, Fearful and Frequent crying. Endocrine Present- New Diabetes. Not Present- Cold Intolerance, Excessive Hunger, Hair Changes, Heat Intolerance and Hot flashes. Hematology Not Present- Easy Bruising, Excessive bleeding, Gland problems, HIV and Persistent Infections.   Vitals  Weight: 155.6 lb Height: 65in Body Surface Area: 1.8 m Body Mass Index: 25.89 kg/m Temp.: 95.1F(Temporal)  Pulse: 60 (Regular)  Resp.: 15 (Unlabored)  BP: 128/70 (Sitting, Left Arm, Standard)    Physical Exam  General Mental Status-Alert. General Appearance-Consistent with stated age. Hydration-Well hydrated. Voice-Normal.  Head and Neck Head-normocephalic, atraumatic with no lesions or palpable masses. Trachea-midline. Thyroid Gland Characteristics - normal size and consistency.  Eye Eyeball - Bilateral-Extraocular movements intact. Sclera/Conjunctiva - Bilateral-No scleral icterus.  Chest and Lung Exam Chest and lung exam reveals -quiet, even and easy respiratory effort with no use of accessory muscles and on auscultation, normal breath sounds, no adventitious sounds and normal vocal resonance. Inspection Chest Wall - Normal. Back - normal.  Breast Breast - Left-Symmetric, Non Tender, No Biopsy scars, no Dimpling, No Inflammation, No Lumpectomy scars, No Mastectomy scars, No Peau d' Orange. Breast - Right-Symmetric, Non Tender, No Biopsy scars, no  Dimpling, No Inflammation, No Lumpectomy scars, No Mastectomy scars, No Peau d' Orange. Breast Lump-No Palpable Breast Mass.  Cardiovascular Cardiovascular examination reveals -normal heart sounds, regular rate and rhythm with no murmurs and normal pedal pulses bilaterally.  Abdomen Inspection Inspection of the abdomen reveals - No Hernias. Palpation/Percussion Palpation and Percussion of the abdomen reveal - Soft, Non Tender, No Rebound tenderness, No Rigidity (guarding) and No hepatosplenomegaly. Auscultation Auscultation of the abdomen reveals - Bowel sounds normal. Note: Lower midline scar from prostatectomy. Right lower scar from appendectomy. I don't really see a scar in the left groin. No organomegaly.   Male Genitourinary Note: Male genitourinary system examined. Large baseball sized right inguinal hernia that reduces when supine. I don't really feel a hernia on the left. Right testicle feels normal. High in the left scrotum there is a hard mass about 2 cm in size and almost feels like there is some tubing. He says this is his testicle and not an implanted device. Penis looks normal.   Neurologic Neurologic evaluation reveals -alert and oriented x 3 with no impairment of recent or remote memory. Mental Status-Normal.  Musculoskeletal Normal Exam - Left-Upper Extremity Strength Normal and Lower Extremity Strength Normal. Normal Exam - Right-Upper Extremity Strength Normal and Lower Extremity Strength Normal.  Lymphatic Head & Neck  General Head & Neck Lymphatics: Bilateral - Description - Normal. Axillary  General Axillary Region: Bilateral - Description - Normal. Tenderness - Non Tender. Femoral & Inguinal  Generalized Femoral & Inguinal Lymphatics: Bilateral - Description - Normal. Tenderness - Non Tender.    Assessment & Plan  RIGHT INGUINAL HERNIA (550.90  K40.90) Current Plans  Schedule for Surgery You have a large right inguinal hernia. We  can push this back in when you lie flat on her back. I do not feel any other hernias. You have multiple scars on your abdomen from previous surgery You will be scheduled for elective repair of your large right inguinal hernia with mesh. We have discussed the rationale for the surgery, the techniques of the surgery, and the risks in detail. Please read the patient information booklet that I gave you.  HYPERTENSION, BENIGN (401.1  I10) HYPERLIPIDEMIA, ACQUIRED (272.4  E78.5)  HISTORY OF PROSTATE CANCER (V10.46  Z85.46) Impression: Open prostatectomy by Dr. Janice Norrie. No known recurrence.  HISTORY OF APPENDECTOMY (V45.89  Z90.49  Addendum Note History of penile prosthesis insertion per urology office notes.    Edsel Petrin. Dalbert Batman, M.D., [facility] General and Minimally invasive Surgery Breast and Colorectal Surgery Office:   [phone] Pager:   [phone]

## 2014-07-13 MED ORDER — CEFAZOLIN SODIUM-DEXTROSE 2-3 GM-% IV SOLR
2.0000 g | INTRAVENOUS | Status: AC
  Administered 2014-07-14: 2 g via INTRAVENOUS
  Filled 2014-07-13: qty 50

## 2014-07-13 NOTE — Anesthesia Preprocedure Evaluation (Addendum)
Anesthesia Evaluation  Patient identified by MRN, date of birth, ID band Patient awake    Reviewed: Allergy & Precautions, NPO status , Patient's Chart, lab work & pertinent test results  Airway Mallampati: II   Neck ROM: Full    Dental  (+) Dental Advisory Given, Teeth Intact   Pulmonary former smoker,  breath sounds clear to auscultation        Cardiovascular hypertension, Pt. on medications Rhythm:Regular     Neuro/Psych Anxiety negative neurological ROS     GI/Hepatic Neg liver ROS, GERD-  Medicated,  Endo/Other  diabetes, Type 2  Renal/GU Creat 1.36     Musculoskeletal   Abdominal (+)  Abdomen: soft.    Peds  Hematology 12/37   Anesthesia Other Findings   Reproductive/Obstetrics                           Anesthesia Physical Anesthesia Plan  ASA: III  Anesthesia Plan: General   Post-op Pain Management: MAC Combined w/ Regional for Post-op pain   Induction:   Airway Management Planned: Oral ETT and LMA  Additional Equipment:   Intra-op Plan:   Post-operative Plan:   Informed Consent: I have reviewed the patients History and Physical, chart, labs and discussed the procedure including the risks, benefits and alternatives for the proposed anesthesia with the patient or authorized representative who has indicated his/her understanding and acceptance.     Plan Discussed with:   Anesthesia Plan Comments: (Will offer TAP block, multimodal pain rx)        Anesthesia Quick Evaluation

## 2014-07-14 ENCOUNTER — Ambulatory Visit (HOSPITAL_COMMUNITY): Payer: Medicare Other | Admitting: Anesthesiology

## 2014-07-14 ENCOUNTER — Ambulatory Visit (HOSPITAL_COMMUNITY): Payer: Medicare Other | Admitting: Vascular Surgery

## 2014-07-14 ENCOUNTER — Ambulatory Visit (HOSPITAL_COMMUNITY)
Admission: RE | Admit: 2014-07-14 | Discharge: 2014-07-15 | Disposition: A | Discharge status: Home / Self Care | Payer: Medicare Other | Source: Ambulatory Visit | Attending: General Surgery | Admitting: General Surgery

## 2014-07-14 ENCOUNTER — Encounter (HOSPITAL_COMMUNITY): Payer: Self-pay | Admitting: Certified Registered Nurse Anesthetist

## 2014-07-14 ENCOUNTER — Encounter (HOSPITAL_COMMUNITY)
Admission: RE | Disposition: A | Discharge status: Home / Self Care | Payer: Self-pay | Source: Ambulatory Visit | Attending: General Surgery

## 2014-07-14 DIAGNOSIS — Z960 Presence of urogenital implants: Secondary | ICD-10-CM | POA: Insufficient documentation

## 2014-07-14 DIAGNOSIS — Z8546 Personal history of malignant neoplasm of prostate: Secondary | ICD-10-CM | POA: Insufficient documentation

## 2014-07-14 DIAGNOSIS — Z9079 Acquired absence of other genital organ(s): Secondary | ICD-10-CM | POA: Insufficient documentation

## 2014-07-14 DIAGNOSIS — K219 Gastro-esophageal reflux disease without esophagitis: Secondary | ICD-10-CM | POA: Insufficient documentation

## 2014-07-14 DIAGNOSIS — F419 Anxiety disorder, unspecified: Secondary | ICD-10-CM | POA: Insufficient documentation

## 2014-07-14 DIAGNOSIS — E785 Hyperlipidemia, unspecified: Secondary | ICD-10-CM | POA: Diagnosis not present

## 2014-07-14 DIAGNOSIS — E119 Type 2 diabetes mellitus without complications: Secondary | ICD-10-CM | POA: Insufficient documentation

## 2014-07-14 DIAGNOSIS — Z9049 Acquired absence of other specified parts of digestive tract: Secondary | ICD-10-CM | POA: Insufficient documentation

## 2014-07-14 DIAGNOSIS — K4090 Unilateral inguinal hernia, without obstruction or gangrene, not specified as recurrent: Secondary | ICD-10-CM | POA: Diagnosis present

## 2014-07-14 DIAGNOSIS — Z87891 Personal history of nicotine dependence: Secondary | ICD-10-CM | POA: Insufficient documentation

## 2014-07-14 DIAGNOSIS — I10 Essential (primary) hypertension: Secondary | ICD-10-CM | POA: Insufficient documentation

## 2014-07-14 HISTORY — PX: INGUINAL HERNIA REPAIR: SHX194

## 2014-07-14 HISTORY — PX: INGUINAL HERNIA REPAIR: SUR1180

## 2014-07-14 HISTORY — PX: INSERTION OF MESH: SHX5868

## 2014-07-14 LAB — CBC
HCT: 34.1 % — ABNORMAL LOW (ref 39.0–52.0)
Hemoglobin: 11.1 g/dL — ABNORMAL LOW (ref 13.0–17.0)
MCH: 30 pg (ref 26.0–34.0)
MCHC: 32.6 g/dL (ref 30.0–36.0)
MCV: 92.2 fL (ref 78.0–100.0)
PLATELETS: 87 10*3/uL — AB (ref 150–400)
RBC: 3.7 MIL/uL — ABNORMAL LOW (ref 4.22–5.81)
RDW: 14.2 % (ref 11.5–15.5)
WBC: 5.4 10*3/uL (ref 4.0–10.5)

## 2014-07-14 LAB — CREATININE, SERUM
CREATININE: 1.21 mg/dL (ref 0.50–1.35)
GFR calc Af Amer: 61 mL/min — ABNORMAL LOW (ref >90)
GFR, EST NON AFRICAN AMERICAN: 53 mL/min — AB (ref >90)

## 2014-07-14 LAB — GLUCOSE, CAPILLARY
GLUCOSE-CAPILLARY: 77 mg/dL (ref 70–99)
Glucose-Capillary: 82 mg/dL (ref 70–99)

## 2014-07-14 SURGERY — REPAIR, HERNIA, INGUINAL, ADULT
Anesthesia: General | Site: Groin | Laterality: Right

## 2014-07-14 MED ORDER — SUCCINYLCHOLINE CHLORIDE 20 MG/ML IJ SOLN
INTRAMUSCULAR | Status: AC
  Filled 2014-07-14: qty 1

## 2014-07-14 MED ORDER — ONDANSETRON HCL 4 MG/2ML IJ SOLN: 4.0000 mg | Freq: Four times a day (QID) | INTRAMUSCULAR | Status: DC | PRN

## 2014-07-14 MED ORDER — HYDROCHLOROTHIAZIDE 12.5 MG PO CAPS
12.5000 mg | ORAL_CAPSULE | Freq: Every day | ORAL | Status: DC
  Administered 2014-07-14: 12.5 mg via ORAL
  Filled 2014-07-14: qty 1

## 2014-07-14 MED ORDER — PROPOFOL 10 MG/ML IV BOLUS
INTRAVENOUS | Status: AC
  Filled 2014-07-14: qty 20

## 2014-07-14 MED ORDER — FENTANYL CITRATE 0.05 MG/ML IJ SOLN
INTRAMUSCULAR | Status: DC | PRN
  Administered 2014-07-14: 50 ug via INTRAVENOUS
  Administered 2014-07-14: 25 ug via INTRAVENOUS
  Administered 2014-07-14: 50 ug via INTRAVENOUS

## 2014-07-14 MED ORDER — ASPIRIN EC 81 MG PO TBEC
81.0000 mg | DELAYED_RELEASE_TABLET | Freq: Every day | ORAL | Status: DC
  Administered 2014-07-14: 81 mg via ORAL
  Filled 2014-07-14: qty 1

## 2014-07-14 MED ORDER — SENNOSIDES-DOCUSATE SODIUM 8.6-50 MG PO TABS: 1.0000 | ORAL_TABLET | Freq: Every day | ORAL | Status: DC

## 2014-07-14 MED ORDER — PREDNISONE 5 MG PO TABS
5.0000 mg | ORAL_TABLET | Freq: Every day | ORAL | Status: DC
  Administered 2014-07-14: 5 mg via ORAL
  Filled 2014-07-14: qty 1

## 2014-07-14 MED ORDER — PROMETHAZINE HCL 25 MG/ML IJ SOLN: 6.2500 mg | INTRAMUSCULAR | Status: DC | PRN

## 2014-07-14 MED ORDER — LACTATED RINGERS IV SOLN
INTRAVENOUS | Status: DC | PRN
  Administered 2014-07-14 (×2): via INTRAVENOUS

## 2014-07-14 MED ORDER — PROPOFOL 10 MG/ML IV BOLUS
INTRAVENOUS | Status: DC | PRN
  Administered 2014-07-14: 50 mg via INTRAVENOUS
  Administered 2014-07-14: 150 mg via INTRAVENOUS

## 2014-07-14 MED ORDER — ONDANSETRON HCL 4 MG PO TABS: 4.0000 mg | ORAL_TABLET | Freq: Four times a day (QID) | ORAL | Status: DC | PRN

## 2014-07-14 MED ORDER — HYDROCODONE-ACETAMINOPHEN 5-325 MG PO TABS: 1.0000 | ORAL_TABLET | ORAL | Status: DC | PRN

## 2014-07-14 MED ORDER — BUPIVACAINE-EPINEPHRINE 0.5% -1:200000 IJ SOLN
INTRAMUSCULAR | Status: DC | PRN
  Administered 2014-07-14: 4 mL

## 2014-07-14 MED ORDER — FENTANYL CITRATE 0.05 MG/ML IJ SOLN: 25.0000 ug | INTRAMUSCULAR | Status: DC | PRN

## 2014-07-14 MED ORDER — FENTANYL CITRATE 0.05 MG/ML IJ SOLN
INTRAMUSCULAR | Status: AC
  Filled 2014-07-14: qty 5

## 2014-07-14 MED ORDER — ABIRATERONE ACETATE 250 MG PO TABS
250.0000 mg | ORAL_TABLET | Freq: Every day | ORAL | Status: DC
  Administered 2014-07-14: 250 mg via ORAL

## 2014-07-14 MED ORDER — ONDANSETRON HCL 4 MG/2ML IJ SOLN
INTRAMUSCULAR | Status: DC | PRN
  Administered 2014-07-14: 4 mg via INTRAVENOUS

## 2014-07-14 MED ORDER — POTASSIUM CHLORIDE IN NACL 20-0.9 MEQ/L-% IV SOLN
INTRAVENOUS | Status: DC
  Administered 2014-07-14 – 2014-07-15 (×2): via INTRAVENOUS
  Filled 2014-07-14 (×3): qty 1000

## 2014-07-14 MED ORDER — SULINDAC 200 MG PO TABS
200.0000 mg | ORAL_TABLET | Freq: Every day | ORAL | Status: DC
  Administered 2014-07-14: 200 mg via ORAL
  Filled 2014-07-14 (×2): qty 1

## 2014-07-14 MED ORDER — LOSARTAN POTASSIUM 50 MG PO TABS
50.0000 mg | ORAL_TABLET | Freq: Every day | ORAL | Status: DC
  Administered 2014-07-14: 50 mg via ORAL
  Filled 2014-07-14: qty 1

## 2014-07-14 MED ORDER — CHLORHEXIDINE GLUCONATE 4 % EX LIQD
1.0000 "application " | Freq: Once | CUTANEOUS | Status: DC
  Filled 2014-07-14: qty 15

## 2014-07-14 MED ORDER — RISEDRONATE SODIUM 5 MG PO TABS: 35.0000 mg | ORAL_TABLET | Freq: Every day | ORAL | Status: DC

## 2014-07-14 MED ORDER — HEPARIN SODIUM (PORCINE) 5000 UNIT/ML IJ SOLN
5000.0000 [IU] | Freq: Three times a day (TID) | INTRAMUSCULAR | Status: DC
  Administered 2014-07-15: 5000 [IU] via SUBCUTANEOUS
  Filled 2014-07-14: qty 1

## 2014-07-14 MED ORDER — GLIPIZIDE ER 2.5 MG PO TB24
2.5000 mg | ORAL_TABLET | Freq: Every day | ORAL | Status: DC
  Administered 2014-07-14: 2.5 mg via ORAL
  Filled 2014-07-14 (×3): qty 1

## 2014-07-14 MED ORDER — GEMFIBROZIL 600 MG PO TABS
600.0000 mg | ORAL_TABLET | Freq: Two times a day (BID) | ORAL | Status: DC
  Administered 2014-07-14: 600 mg via ORAL
  Filled 2014-07-14 (×4): qty 1

## 2014-07-14 MED ORDER — LIDOCAINE HCL (CARDIAC) 20 MG/ML IV SOLN
INTRAVENOUS | Status: DC | PRN
  Administered 2014-07-14: 100 mg via INTRAVENOUS

## 2014-07-14 MED ORDER — PANTOPRAZOLE SODIUM 40 MG PO TBEC
40.0000 mg | DELAYED_RELEASE_TABLET | Freq: Every day | ORAL | Status: DC
  Filled 2014-07-14: qty 1

## 2014-07-14 MED ORDER — HYDROMORPHONE HCL 1 MG/ML IJ SOLN: 0.5000 mg | INTRAMUSCULAR | Status: DC | PRN

## 2014-07-14 MED ORDER — MEPERIDINE HCL 25 MG/ML IJ SOLN: 6.2500 mg | INTRAMUSCULAR | Status: DC | PRN

## 2014-07-14 MED ORDER — LOSARTAN POTASSIUM-HCTZ 100-25 MG PO TABS: 0.5000 | ORAL_TABLET | Freq: Every day | ORAL | Status: DC

## 2014-07-14 MED ORDER — METOCLOPRAMIDE HCL 5 MG PO TABS
5.0000 mg | ORAL_TABLET | Freq: Three times a day (TID) | ORAL | Status: DC
  Administered 2014-07-14 (×2): 5 mg via ORAL
  Filled 2014-07-14 (×2): qty 1

## 2014-07-14 MED ORDER — BUPIVACAINE-EPINEPHRINE (PF) 0.5% -1:200000 IJ SOLN
INTRAMUSCULAR | Status: AC
  Filled 2014-07-14: qty 30

## 2014-07-14 MED ORDER — ERYTHROMYCIN 5 MG/GM OP OINT: TOPICAL_OINTMENT | Freq: Four times a day (QID) | OPHTHALMIC | Status: DC

## 2014-07-14 MED ORDER — LACTULOSE 20 GM/30ML PO SOLN
30.0000 mL | Freq: Two times a day (BID) | ORAL | Status: DC | PRN
Start: 2014-07-14 — End: 2014-07-15
  Filled 2014-07-14: qty 30

## 2014-07-14 MED ORDER — LIDOCAINE HCL (CARDIAC) 20 MG/ML IV SOLN
INTRAVENOUS | Status: AC
  Filled 2014-07-14: qty 5

## 2014-07-14 MED ORDER — ROCURONIUM BROMIDE 50 MG/5ML IV SOLN
INTRAVENOUS | Status: AC
  Filled 2014-07-14: qty 1

## 2014-07-14 MED ORDER — 0.9 % SODIUM CHLORIDE (POUR BTL) OPTIME
TOPICAL | Status: DC | PRN
  Administered 2014-07-14: 1000 mL

## 2014-07-14 MED ORDER — SODIUM CHLORIDE 0.9 % IJ SOLN
INTRAMUSCULAR | Status: AC
  Filled 2014-07-14: qty 10

## 2014-07-14 MED ORDER — EPHEDRINE SULFATE 50 MG/ML IJ SOLN
INTRAMUSCULAR | Status: AC
  Filled 2014-07-14: qty 1

## 2014-07-14 MED ORDER — VITAMIN C 500 MG PO TABS
500.0000 mg | ORAL_TABLET | Freq: Every day | ORAL | Status: DC
  Administered 2014-07-14: 500 mg via ORAL
  Filled 2014-07-14: qty 1

## 2014-07-14 MED ORDER — ATORVASTATIN CALCIUM 20 MG PO TABS
20.0000 mg | ORAL_TABLET | Freq: Every day | ORAL | Status: DC
  Administered 2014-07-14: 20 mg via ORAL
  Filled 2014-07-14: qty 1

## 2014-07-14 SURGICAL SUPPLY — 50 items
BLADE SURG 10 STRL SS (BLADE) ×3 IMPLANT
BLADE SURG 15 STRL LF DISP TIS (BLADE) ×1 IMPLANT
BLADE SURG 15 STRL SS (BLADE) ×3
BLADE SURG ROTATE 9660 (MISCELLANEOUS) ×2 IMPLANT
CANISTER SUCTION 2500CC (MISCELLANEOUS) ×3 IMPLANT
CHLORAPREP W/TINT 26ML (MISCELLANEOUS) ×3 IMPLANT
COVER SURGICAL LIGHT HANDLE (MISCELLANEOUS) ×3 IMPLANT
DRAIN PENROSE 1/2X12 LTX STRL (WOUND CARE) ×2 IMPLANT
DRAPE LAPAROTOMY TRNSV 102X78 (DRAPE) ×3 IMPLANT
DRAPE UTILITY XL STRL (DRAPES) ×2 IMPLANT
ELECT CAUTERY BLADE 6.4 (BLADE) ×3 IMPLANT
ELECT REM PT RETURN 9FT ADLT (ELECTROSURGICAL) ×3
ELECTRODE REM PT RTRN 9FT ADLT (ELECTROSURGICAL) ×1 IMPLANT
GLOVE BIOGEL PI IND STRL 7.5 (GLOVE) IMPLANT
GLOVE BIOGEL PI IND STRL 8 (GLOVE) IMPLANT
GLOVE BIOGEL PI INDICATOR 7.5 (GLOVE) ×2
GLOVE BIOGEL PI INDICATOR 8 (GLOVE) ×2
GLOVE EUDERMIC 7 POWDERFREE (GLOVE) ×3 IMPLANT
GLOVE SURG SS PI 7.5 STRL IVOR (GLOVE) ×2 IMPLANT
GOWN STRL REUS W/ TWL LRG LVL3 (GOWN DISPOSABLE) ×1 IMPLANT
GOWN STRL REUS W/ TWL XL LVL3 (GOWN DISPOSABLE) ×1 IMPLANT
GOWN STRL REUS W/TWL LRG LVL3 (GOWN DISPOSABLE)
GOWN STRL REUS W/TWL XL LVL3 (GOWN DISPOSABLE) ×6
KIT BASIN OR (CUSTOM PROCEDURE TRAY) ×3 IMPLANT
KIT ROOM TURNOVER OR (KITS) ×3 IMPLANT
LIQUID BAND (GAUZE/BANDAGES/DRESSINGS) ×3 IMPLANT
MESH ULTRAPRO 3X6 7.6X15CM (Mesh General) ×2 IMPLANT
NDL HYPO 25GX1X1/2 BEV (NEEDLE) ×1 IMPLANT
NEEDLE HYPO 25GX1X1/2 BEV (NEEDLE) ×3 IMPLANT
NS IRRIG 1000ML POUR BTL (IV SOLUTION) ×3 IMPLANT
PACK SURGICAL SETUP 50X90 (CUSTOM PROCEDURE TRAY) ×3 IMPLANT
PAD ARMBOARD 7.5X6 YLW CONV (MISCELLANEOUS) ×3 IMPLANT
PENCIL BUTTON HOLSTER BLD 10FT (ELECTRODE) ×3 IMPLANT
SPONGE LAP 18X18 X RAY DECT (DISPOSABLE) ×3 IMPLANT
SUT MNCRL AB 4-0 PS2 18 (SUTURE) ×3 IMPLANT
SUT PROLENE 2 0 CT2 30 (SUTURE) ×9 IMPLANT
SUT SILK 2 0 (SUTURE) ×3
SUT SILK 2 0 SH (SUTURE) IMPLANT
SUT SILK 2-0 18XBRD TIE 12 (SUTURE) ×1 IMPLANT
SUT VIC AB 2-0 CT1 27 (SUTURE) ×3
SUT VIC AB 2-0 CT1 TAPERPNT 27 (SUTURE) ×1 IMPLANT
SUT VIC AB 3-0 SH 27 (SUTURE) ×3
SUT VIC AB 3-0 SH 27XBRD (SUTURE) ×1 IMPLANT
SYR BULB 3OZ (MISCELLANEOUS) ×3 IMPLANT
SYR CONTROL 10ML LL (SYRINGE) ×3 IMPLANT
TOWEL OR 17X24 6PK STRL BLUE (TOWEL DISPOSABLE) ×1 IMPLANT
TOWEL OR 17X26 10 PK STRL BLUE (TOWEL DISPOSABLE) ×3 IMPLANT
TUBE CONNECTING 12'X1/4 (SUCTIONS) ×1
TUBE CONNECTING 12X1/4 (SUCTIONS) ×2 IMPLANT
YANKAUER SUCT BULB TIP NO VENT (SUCTIONS) ×3 IMPLANT

## 2014-07-14 NOTE — Interval H&P Note (Signed)
History and Physical Interval Note:  07/14/2014 6:51 AM  Shane [name]  has presented today for surgery, with the diagnosis of right inguinal hernia  The various methods of treatment have been discussed with the patient and family. After consideration of risks, benefits and other options for treatment, the patient has consented to  Procedure(s): OPEN REPAIR RIGHT INGUINAL HERNIA  (Right) INSERTION OF MESH (Right) as a surgical intervention .  The patient's history has been reviewed, patient examined, no change in status, stable for surgery.  I have reviewed the patient's chart and labs.  Questions were answered to the patient's satisfaction.     [name]

## 2014-07-14 NOTE — Anesthesia Postprocedure Evaluation (Signed)
  Anesthesia Post-op Note  Patient: Shane [name]  Procedure(s) Performed: Procedure(s): OPEN REPAIR RIGHT INGUINAL HERNIA  (Right) INSERTION OF MESH (Right)  Patient Location: PACU  Anesthesia Type:General  Level of Consciousness: awake and alert   Airway and Oxygen Therapy: Patient Spontanous Breathing and Patient connected to nasal cannula oxygen  Post-op Pain: mild  Post-op Assessment: Post-op Vital signs reviewed and Patient's Cardiovascular Status Stable  Post-op Vital Signs: Reviewed and stable  Last Vitals:  Filed Vitals:   07/14/14 0850  BP: 163/50  Pulse: 76  Temp:   Resp: 14    Complications: No apparent anesthesia complications

## 2014-07-14 NOTE — Transfer of Care (Signed)
Immediate Anesthesia Transfer of Care Note  Patient: Shane [name]  Procedure(s) Performed: Procedure(s): OPEN REPAIR RIGHT INGUINAL HERNIA  (Right) INSERTION OF MESH (Right)  Patient Location: PACU  Anesthesia Type:General  Level of Consciousness: awake and alert   Airway & Oxygen Therapy: Patient Spontanous Breathing and Patient connected to nasal cannula oxygen  Post-op Assessment: Report given to RN and Post -op Vital signs reviewed and stable  Post vital signs: Reviewed and stable  Last Vitals:  Filed Vitals:   07/14/14 0647  BP: 173/59  Pulse: 95  Temp: 36.9 C  Resp: 20    Complications: No apparent anesthesia complications

## 2014-07-14 NOTE — Progress Notes (Signed)

## 2014-07-14 NOTE — Anesthesia Procedure Notes (Signed)
Anesthesia Regional Block:  TAP block  Pre-Anesthetic Checklist: ,, timeout performed, Correct Patient, Correct Site, Correct Laterality, Correct Procedure, Correct Position, site marked, Risks and benefits discussed,  Surgical consent,  Pre-op evaluation,  At surgeon's request and post-op pain management  Laterality: Right  Prep: chloraprep       Needles:  Injection technique: Single-shot  Needle Type: Echogenic Stimulator Needle     Needle Length: 10cm 10 cm Needle Gauge: 21 and 21 G    Additional Needles:  Procedures: ultrasound guided (picture in chart) TAP block Narrative:  Start time: 07/14/2014 7:06 AM End time: 07/14/2014 7:16 AM Injection made incrementally with aspirations every 5 mL. Anesthesiologist: Alexis Frock  Additional Notes: R TAP block done with US guidance, 30 ml .5% marcaine with epi, multiple asp, talked to patient throughout procedure, no complications

## 2014-07-14 NOTE — Op Note (Signed)
Patient Name:           Shane [name]   Date of Surgery:        07/14/2014  Pre op Diagnosis:      Right inguinal hernia  Post op Diagnosis:    Right inguinal hernia  Procedure:                 Open repair right inguinal hernia with mesh  Surgeon:                     Edsel Petrin. Dalbert Batman, M.D., FACS  Assistant:                      OR staff  Operative Indications:   The patient is a 81 year old male who presents with a right inguinal hernia. He was referred to me by Dr. Doylene Canard., his PCP, for management of a large right inguinal hernia. The patient states that he has had a right inguinal hernia for several years. It has been getting larger and he has decided he wants to have this repaired. Not much pain. No good history of incarceration. Dr. Merrilee Jansky records list a left inguinal hernia repair in the history, but the patient states he has never had a hernia repair in the past. Dr. Reita Chard notes at [facility] reveal history of metastatic prostate cancer, followed by Dr. Alen Blew and also mentions history of insertion of penile prosthesis and history of retropubic radical prostatectomy and bilateral pelvic lymphadenectomy.  Appendectomy 1947, cardiac catheterization 2005 with with normal coronaries. Takes Lipitor, Hyzaar, potassium, aspirin, amlodipine, vitamin D, Prilosec, Actonel, Reglan, gemfibrozil, calcium, garlic. No cardiac or pulmonary symptoms symptoms.  Exam reveals a baseball sized right inguinal hernia that reduces when supine. Lower midline incision. Complex right lower quadrant scar from appendectomy. Palpable prosthesis  Operative Findings:       I found a large indirect right inguinal hernia. There were no incarcerated contents. A standard Lichtenstein repair with mesh was performed.  Procedure in Detail:          Following the induction of general LMA anesthesia a surgical timeout was performed. The abdomen and groin and genitalia were prepped and draped in a  sterile fashion. Intravenous antibiotics were given. A TAPP  block had been performed by the anesthesiologist in the preop holding area. 0.5% Marcaine with epinephrine was used as a local infiltration in anesthetic into the skin and subcutaneous tissue.     A transverse incision was made in the right inguinal area overlying the inguinal canal. Dissection was carried down to the aponeurosis of the external oblique. The external oblique was incised in the direction of its fibers, opening of the external inguinal ring. The external oblique was dissected away from the underlying tissues and subcutaneous retractors were placed. Inferiorly I dissected out the tissues until I could identify the inguinal ligament. Sensory nerves were isolated laterally, clamped divided and ligated with 2-0 silk ties. The cord structures were mobilized and encircled with a Penrose drain. Cremasteric muscle fibers were skeletonized. I was then able to dissect out a large indirect hernia sac and dissect  this back to the internal ring. The sac was opened and found to be empty. I passed my finger into the abdominal cavity. I felt no abnormalities other than calcified iliac vessels. No hernias noted in the femoral area. The indirect sac was twisted and suture ligated at the level of the internal ring with a suture ligature  of 2-0 Vicryl. The redundant sac was excised and sent to pathology because of his history of prostate cancer.   The internal ring was tightened laterally with figure-of-eight sutures of 2-0 Vicryl. The hernia was repaired and reinforced with a 3" x 6" piece of ultra Pro mesh. The mesh was brought to the operative field and trimmed the corners to accommodate the anatomy of the wound. The mesh was sutured in place with running sutures of 2-0 Prolene and interrupted mattress sutures of 2-0 Prolene. The mesh was sutured so as to generously overlap the fascia at the pubic tubercle, then along the inguinal ligament inferiorly.  Medially, superiorly, and superiolaterally several mattress sutures of 2-0 Prolene were placed to secure the mesh. The mesh was incised laterally so as to wraparound the cord structures at the internal ring. The tails were overlapped laterally. Further Prolene sutures were placed laterally. This provided a  secure repair and coverage both medial and lateral to the internal ring but allowed an adequate fingertip opening for the cord structures. The wound was irrigated with saline. Hemostasis was excellent. The external oblique was closed with a running suture of 2-0 Vicryl. Scarpa's fascia was closed with 3-0 Vicryl sutures and the skin closed with a running 4-0 Monocryl suture, subcuticular, and Dermabond. Patient tolerated the procedure well was taken to PACU in stable condition. EBL 10 mL. Counts correct. Complications none.    Edsel Petrin. Dalbert Batman, M.D., FACS General and Minimally Invasive Surgery Breast and Colorectal Surgery  07/14/2014 8:43 AM

## 2014-07-15 ENCOUNTER — Encounter (HOSPITAL_COMMUNITY): Payer: Self-pay | Admitting: General Surgery

## 2014-07-15 DIAGNOSIS — K4090 Unilateral inguinal hernia, without obstruction or gangrene, not specified as recurrent: Secondary | ICD-10-CM | POA: Diagnosis not present

## 2014-07-15 MED ORDER — HYDROCODONE-ACETAMINOPHEN 5-325 MG PO TABS: 1.0000 | ORAL_TABLET | Freq: Four times a day (QID) | ORAL | Status: DC | PRN

## 2014-07-15 NOTE — Progress Notes (Signed)
Verbally understood DC, wife at bedside to take pt home

## 2014-07-15 NOTE — Discharge Instructions (Signed)
-  see above 

## 2014-07-15 NOTE — Discharge Summary (Signed)
Patient ID: Shane [name] [id number] 81 y.o. 07-Oct-1926  Admit date: 07/14/2014  Discharge date and time: 07/15/2014  Admitting Physician: [name]  Discharge Physician: [name]  Admission Diagnoses: right inguinal hernia  Discharge Diagnoses: Right inguinal hernia History of prostate cancer History open prostatectomy Metastatic prostate cancer History appendectomy Hypertension, benign Hyperlipidemia History of penile prosthesis insertion  Operations: Procedure(s): OPEN REPAIR RIGHT INGUINAL HERNIA  INSERTION OF MESH  Admission Condition: good  Discharged Condition: good  Indication for Admission: The patient is a 81 year old male who presents with a right inguinal hernia. He was referred to me by Dr. Doylene Canard., his PCP, for management of a large right inguinal hernia. The patient states that he has had a right inguinal hernia for several years. It has been getting larger and he has decided he wants to have this repaired. Not much pain. No good history of incarceration. Dr. Merrilee Jansky records list a left inguinal hernia repair in the history, but the patient states he has never had a hernia repair in the past. Dr. Sammie Bench notes at [facility] reveal history of metastatic prostate cancer, followed by Dr. Alen Blew and also mentions history of insertion of penile prosthesis and history of retropubic radical prostatectomy and bilateral pelvic lymphadenectomy. Appendectomy 1947, cardiac catheterization 2005 with with normal coronaries. Takes Lipitor, Hyzaar, potassium, aspirin, amlodipine, vitamin D, Prilosec, Actonel, Reglan, gemfibrozil, calcium, garlic. No cardiac or pulmonary symptoms symptoms.  Exam reveals a baseball sized right inguinal hernia that reduces when supine. Lower midline incision. Complex right lower quadrant scar from appendectomy. Palpable prosthesis. He is brought to the operating room electively  Hospital Course: On the day of  admission the patient was taken to the operating room and underwent open repair of a right inguinal hernia. Found a large indirect right inguinal hernia but no other abnormalities were noted. Surgery was uneventful. He was admitted overnight for observation and did well. On postop day 1 he was able to ablate the bathroom and was voiding normally. Pain was very well controlled. He was tolerating diet. He felt ready to go home. Examination on postop day 1 showed the incision the right groin to look good. Minimal edema. No hematoma. Skin healthy above and below the incision. Penis scrotum and testes normal. Palpable prosthesis.     He was given a prescription for Norco for pain. He was told to continue his usual medications. He was instructed in diet and activities. He was given instructions in written form. He was asked to return to see me in the office in 3 weeks.  Consults: None  Significant Diagnostic Studies: none  Treatments: surgery: Open repair right inguinal hernia with mesh  Disposition: Home  Patient Instructions:    Medication List    TAKE these medications        aspirin EC 81 MG tablet  Take 81 mg by mouth at bedtime.     atorvastatin 20 MG tablet  Commonly known as:  LIPITOR  Take 20 mg by mouth at bedtime.     [facility] SENNA PLUS 8.6-50 MG per tablet  Generic drug:  senna-docusate  TAKE 2 TABLETS BY MOUTH 2 (TWO) TIMES DAILY.     erythromycin ophthalmic ointment  Place into the left eye 4 (four) times daily. Apply 1/2 inch to left eye 4 times a day for 5 days.     gemfibrozil 600 MG tablet  Commonly known as:  LOPID  Take 600 mg by mouth 2 (two) times daily before a meal.  glipiZIDE 2.5 MG 24 hr tablet  Commonly known as:  GLUCOTROL XL  Take 2.5 mg by mouth daily.     HYDROcodone-acetaminophen 5-325 MG per tablet  Commonly known as:  NORCO  Take 1-2 tablets by mouth every 6 (six) hours as needed for moderate pain or severe pain.     HYZAAR 100-25 MG per tablet   Generic drug:  losartan-hydrochlorothiazide  Take 0.5 tablets by mouth daily.     Lactulose 20 GM/30ML Soln  Take 30 mLs (20 g total) by mouth 2 (two) times daily as needed (For constipation).     metoCLOPramide 5 MG tablet  Commonly known as:  REGLAN  Take 5 mg by mouth 4 (four) times daily -  before meals and at bedtime.     omeprazole 20 MG capsule  Commonly known as:  PRILOSEC  Take 20 mg by mouth daily before breakfast.     OVER THE COUNTER MEDICATION  Place 1 drop into the left eye at bedtime as needed (irritation). Over the counter eye drops     predniSONE 5 MG tablet  Commonly known as:  DELTASONE  TAKE 1 TABLET BY MOUTH DAILY     risedronate 35 MG tablet  Commonly known as:  ACTONEL  Take 35 mg by mouth every 7 (seven) days. On Mondays     sulindac 200 MG tablet  Commonly known as:  CLINORIL  Take 200 mg by mouth daily.     vitamin C 500 MG tablet  Commonly known as:  ASCORBIC ACID  Take 500 mg by mouth daily.     VITAMIN D PO  Take 1 tablet by mouth daily.     ZYTIGA 250 MG tablet  Generic drug:  abiraterone Acetate  TAKE 4 TABLETS BY MOUTH DAILY ON AN EMPTY STOMACH 1 HOUR BEFORE OR 2 HOURS AFTER A MEAL        Activity: No sports, running, or heavy lifting for 5-6 weeks. Normal ambulation daily encouraged. Diet: diabetic diet Wound Care: none needed  Follow-up:  With Dr. Dalbert Batman in 3 weeks.  Signed: Edsel Petrin. Dalbert Batman, M.D., FACS General and minimally invasive surgery Breast and Colorectal Surgery  07/15/2014, 6:44 AM

## 2014-07-23 ENCOUNTER — Other Ambulatory Visit: Payer: Self-pay | Admitting: Oncology

## 2014-08-01 ENCOUNTER — Other Ambulatory Visit: Payer: Self-pay | Admitting: Oncology

## 2014-08-01 DIAGNOSIS — C61 Malignant neoplasm of prostate: Secondary | ICD-10-CM

## 2014-08-24 ENCOUNTER — Other Ambulatory Visit: Payer: Self-pay

## 2014-08-24 ENCOUNTER — Inpatient Hospital Stay (HOSPITAL_COMMUNITY)
Admission: EM | Admit: 2014-08-24 | Discharge: 2014-08-31 | DRG: 329 | Disposition: A | Discharge status: Skilled Nursing Facility | Payer: Medicare Other | Attending: General Surgery | Admitting: General Surgery

## 2014-08-24 ENCOUNTER — Emergency Department (HOSPITAL_COMMUNITY): Payer: Medicare Other | Admitting: Anesthesiology

## 2014-08-24 ENCOUNTER — Encounter (HOSPITAL_COMMUNITY): Admission: EM | Disposition: A | Discharge status: Skilled Nursing Facility | Payer: Self-pay | Source: Home / Self Care

## 2014-08-24 ENCOUNTER — Emergency Department (HOSPITAL_COMMUNITY): Payer: Medicare Other

## 2014-08-24 ENCOUNTER — Encounter (HOSPITAL_COMMUNITY): Payer: Self-pay

## 2014-08-24 DIAGNOSIS — D490 Neoplasm of unspecified behavior of digestive system: Secondary | ICD-10-CM | POA: Diagnosis not present

## 2014-08-24 DIAGNOSIS — R011 Cardiac murmur, unspecified: Secondary | ICD-10-CM | POA: Diagnosis present

## 2014-08-24 DIAGNOSIS — K219 Gastro-esophageal reflux disease without esophagitis: Secondary | ICD-10-CM | POA: Diagnosis not present

## 2014-08-24 DIAGNOSIS — K5900 Constipation, unspecified: Secondary | ICD-10-CM | POA: Diagnosis not present

## 2014-08-24 DIAGNOSIS — C61 Malignant neoplasm of prostate: Secondary | ICD-10-CM | POA: Diagnosis present

## 2014-08-24 DIAGNOSIS — F419 Anxiety disorder, unspecified: Secondary | ICD-10-CM | POA: Diagnosis not present

## 2014-08-24 DIAGNOSIS — Z87891 Personal history of nicotine dependence: Secondary | ICD-10-CM

## 2014-08-24 DIAGNOSIS — R1030 Lower abdominal pain, unspecified: Secondary | ICD-10-CM

## 2014-08-24 DIAGNOSIS — E785 Hyperlipidemia, unspecified: Secondary | ICD-10-CM | POA: Diagnosis not present

## 2014-08-24 DIAGNOSIS — I10 Essential (primary) hypertension: Secondary | ICD-10-CM | POA: Diagnosis present

## 2014-08-24 DIAGNOSIS — Z01811 Encounter for preprocedural respiratory examination: Secondary | ICD-10-CM

## 2014-08-24 DIAGNOSIS — Z7952 Long term (current) use of systemic steroids: Secondary | ICD-10-CM | POA: Diagnosis not present

## 2014-08-24 DIAGNOSIS — C799 Secondary malignant neoplasm of unspecified site: Secondary | ICD-10-CM | POA: Diagnosis present

## 2014-08-24 DIAGNOSIS — K631 Perforation of intestine (nontraumatic): Secondary | ICD-10-CM | POA: Diagnosis present

## 2014-08-24 DIAGNOSIS — D649 Anemia, unspecified: Secondary | ICD-10-CM | POA: Diagnosis present

## 2014-08-24 DIAGNOSIS — K658 Other peritonitis: Secondary | ICD-10-CM | POA: Diagnosis present

## 2014-08-24 DIAGNOSIS — Z7982 Long term (current) use of aspirin: Secondary | ICD-10-CM | POA: Diagnosis not present

## 2014-08-24 DIAGNOSIS — D696 Thrombocytopenia, unspecified: Secondary | ICD-10-CM | POA: Diagnosis present

## 2014-08-24 DIAGNOSIS — I2510 Atherosclerotic heart disease of native coronary artery without angina pectoris: Secondary | ICD-10-CM | POA: Diagnosis not present

## 2014-08-24 DIAGNOSIS — Z8546 Personal history of malignant neoplasm of prostate: Secondary | ICD-10-CM | POA: Diagnosis not present

## 2014-08-24 DIAGNOSIS — E119 Type 2 diabetes mellitus without complications: Secondary | ICD-10-CM | POA: Diagnosis present

## 2014-08-24 DIAGNOSIS — N390 Urinary tract infection, site not specified: Secondary | ICD-10-CM | POA: Diagnosis present

## 2014-08-24 DIAGNOSIS — E0590 Thyrotoxicosis, unspecified without thyrotoxic crisis or storm: Secondary | ICD-10-CM | POA: Diagnosis present

## 2014-08-24 HISTORY — PX: COLOSTOMY: SHX63

## 2014-08-24 HISTORY — PX: LAPAROTOMY: SHX154

## 2014-08-24 LAB — URINALYSIS, ROUTINE W REFLEX MICROSCOPIC
Bilirubin Urine: NEGATIVE
Glucose, UA: NEGATIVE mg/dL
KETONES UR: NEGATIVE mg/dL
Nitrite: POSITIVE — AB
Protein, ur: NEGATIVE mg/dL
SPECIFIC GRAVITY, URINE: 1.035 — AB (ref 1.005–1.030)
UROBILINOGEN UA: 0.2 mg/dL (ref 0.0–1.0)
pH: 6 (ref 5.0–8.0)

## 2014-08-24 LAB — CBC WITH DIFFERENTIAL/PLATELET
Basophils Absolute: 0 10*3/uL (ref 0.0–0.1)
Basophils Relative: 0 % (ref 0–1)
Eosinophils Absolute: 0 10*3/uL (ref 0.0–0.7)
Eosinophils Relative: 0 % (ref 0–5)
HEMATOCRIT: 44.3 % (ref 39.0–52.0)
Hemoglobin: 14.4 g/dL (ref 13.0–17.0)
LYMPHS ABS: 0.5 10*3/uL — AB (ref 0.7–4.0)
Lymphocytes Relative: 11 % — ABNORMAL LOW (ref 12–46)
MCH: 30.1 pg (ref 26.0–34.0)
MCHC: 32.5 g/dL (ref 30.0–36.0)
MCV: 92.7 fL (ref 78.0–100.0)
Monocytes Absolute: 0 10*3/uL — ABNORMAL LOW (ref 0.1–1.0)
Monocytes Relative: 1 % — ABNORMAL LOW (ref 3–12)
NEUTROS PCT: 88 % — AB (ref 43–77)
Neutro Abs: 3.8 10*3/uL (ref 1.7–7.7)
Platelets: DECREASED 10*3/uL (ref 150–400)
RBC: 4.78 MIL/uL (ref 4.22–5.81)
RDW: 14.2 % (ref 11.5–15.5)
WBC MORPHOLOGY: INCREASED
WBC: 4.3 10*3/uL (ref 4.0–10.5)

## 2014-08-24 LAB — GLUCOSE, CAPILLARY
GLUCOSE-CAPILLARY: 101 mg/dL — AB (ref 65–99)
GLUCOSE-CAPILLARY: 115 mg/dL — AB (ref 65–99)
Glucose-Capillary: 47 mg/dL — ABNORMAL LOW (ref 65–99)
Glucose-Capillary: 122 mg/dL — ABNORMAL HIGH (ref 65–99)

## 2014-08-24 LAB — COMPREHENSIVE METABOLIC PANEL
ALK PHOS: 62 U/L (ref 38–126)
ALT: 13 U/L — ABNORMAL LOW (ref 17–63)
ANION GAP: 12 (ref 5–15)
AST: 24 U/L (ref 15–41)
Albumin: 3.5 g/dL (ref 3.5–5.0)
BILIRUBIN TOTAL: 1.7 mg/dL — AB (ref 0.3–1.2)
BUN: 17 mg/dL (ref 6–20)
CALCIUM: 8.8 mg/dL — AB (ref 8.9–10.3)
CO2: 26 mmol/L (ref 22–32)
Chloride: 104 mmol/L (ref 101–111)
Creatinine, Ser: 1.37 mg/dL — ABNORMAL HIGH (ref 0.61–1.24)
GFR calc Af Amer: 53 mL/min — ABNORMAL LOW (ref >60)
GFR calc non Af Amer: 45 mL/min — ABNORMAL LOW (ref >60)
GLUCOSE: 92 mg/dL (ref 65–99)
POTASSIUM: 3.7 mmol/L (ref 3.5–5.1)
Sodium: 142 mmol/L (ref 135–145)
Total Protein: 6.8 g/dL (ref 6.5–8.1)

## 2014-08-24 LAB — MRSA PCR SCREENING: MRSA by PCR: NEGATIVE

## 2014-08-24 LAB — URINE MICROSCOPIC-ADD ON

## 2014-08-24 LAB — TYPE AND SCREEN
ABO/RH(D): B POS
Antibody Screen: NEGATIVE

## 2014-08-24 LAB — LIPASE, BLOOD: Lipase: 19 U/L — ABNORMAL LOW (ref 22–51)

## 2014-08-24 LAB — ABO/RH: ABO/RH(D): B POS

## 2014-08-24 SURGERY — LAPAROTOMY, EXPLORATORY
Anesthesia: General | Site: Abdomen

## 2014-08-24 MED ORDER — SUGAMMADEX SODIUM 200 MG/2ML IV SOLN
2.0000 mg/kg | Freq: Once | INTRAVENOUS | Status: DC
  Filled 2014-08-24: qty 1.4

## 2014-08-24 MED ORDER — PANTOPRAZOLE SODIUM 40 MG IV SOLR
40.0000 mg | INTRAVENOUS | Status: DC
  Administered 2014-08-24 – 2014-08-29 (×6): 40 mg via INTRAVENOUS
  Filled 2014-08-24 (×8): qty 40

## 2014-08-24 MED ORDER — ONDANSETRON HCL 4 MG/2ML IJ SOLN
4.0000 mg | Freq: Once | INTRAMUSCULAR | Status: AC
  Administered 2014-08-24: 4 mg via INTRAVENOUS
  Filled 2014-08-24: qty 2

## 2014-08-24 MED ORDER — SODIUM CHLORIDE 0.9 % IJ SOLN
INTRAMUSCULAR | Status: AC
  Filled 2014-08-24: qty 3

## 2014-08-24 MED ORDER — METHYLPREDNISOLONE SODIUM SUCC 40 MG IJ SOLR
5.0000 mg | Freq: Every day | INTRAMUSCULAR | Status: DC
  Administered 2014-08-25 – 2014-08-28 (×4): 5.2 mg via INTRAVENOUS
  Filled 2014-08-24: qty 1
  Filled 2014-08-24: qty 0.13
  Filled 2014-08-24: qty 1
  Filled 2014-08-24 (×2): qty 0.13

## 2014-08-24 MED ORDER — PHENYLEPHRINE HCL 10 MG/ML IJ SOLN
INTRAMUSCULAR | Status: DC | PRN
  Administered 2014-08-24 (×3): 80 ug via INTRAVENOUS

## 2014-08-24 MED ORDER — GLYCOPYRROLATE 0.2 MG/ML IJ SOLN
INTRAMUSCULAR | Status: DC | PRN
  Administered 2014-08-24: .8 mg via INTRAVENOUS

## 2014-08-24 MED ORDER — INSULIN ASPART 100 UNIT/ML ~~LOC~~ SOLN
0.0000 [IU] | SUBCUTANEOUS | Status: DC
  Administered 2014-08-25 – 2014-08-27 (×9): 1 [IU] via SUBCUTANEOUS
  Administered 2014-08-28: 2 [IU] via SUBCUTANEOUS

## 2014-08-24 MED ORDER — FENTANYL CITRATE (PF) 100 MCG/2ML IJ SOLN: 25.0000 ug | INTRAMUSCULAR | Status: DC | PRN

## 2014-08-24 MED ORDER — SUGAMMADEX SODIUM 200 MG/2ML IV SOLN
INTRAVENOUS | Status: DC | PRN
  Administered 2014-08-24: 1.4 mg via INTRAVENOUS

## 2014-08-24 MED ORDER — ONDANSETRON HCL 4 MG/2ML IJ SOLN
4.0000 mg | INTRAMUSCULAR | Status: DC | PRN
  Administered 2014-08-25: 4 mg via INTRAVENOUS
  Filled 2014-08-24: qty 2

## 2014-08-24 MED ORDER — PHENYLEPHRINE 40 MCG/ML (10ML) SYRINGE FOR IV PUSH (FOR BLOOD PRESSURE SUPPORT)
PREFILLED_SYRINGE | INTRAVENOUS | Status: AC
  Filled 2014-08-24: qty 10

## 2014-08-24 MED ORDER — KCL IN DEXTROSE-NACL 20-5-0.9 MEQ/L-%-% IV SOLN
INTRAVENOUS | Status: DC
  Administered 2014-08-24: 125 mL/h via INTRAVENOUS
  Administered 2014-08-25 (×2): via INTRAVENOUS
  Administered 2014-08-25: 125 mL/h via INTRAVENOUS
  Administered 2014-08-26: 22:00:00 via INTRAVENOUS
  Administered 2014-08-26: 1000 mL via INTRAVENOUS
  Administered 2014-08-26 – 2014-08-28 (×5): via INTRAVENOUS
  Administered 2014-08-29: 1000 mL via INTRAVENOUS
  Administered 2014-08-29: 13:00:00 via INTRAVENOUS
  Administered 2014-08-29: 125 mL/h via INTRAVENOUS
  Administered 2014-08-30: 1000 mL via INTRAVENOUS
  Filled 2014-08-24 (×24): qty 1000

## 2014-08-24 MED ORDER — PROPOFOL 10 MG/ML IV BOLUS
INTRAVENOUS | Status: AC
  Filled 2014-08-24: qty 20

## 2014-08-24 MED ORDER — LACTATED RINGERS IV SOLN: INTRAVENOUS | Status: DC

## 2014-08-24 MED ORDER — MORPHINE SULFATE 2 MG/ML IJ SOLN
1.0000 mg | INTRAMUSCULAR | Status: DC | PRN
  Administered 2014-08-25 (×3): 1 mg via INTRAVENOUS
  Administered 2014-08-25: 2 mg via INTRAVENOUS
  Administered 2014-08-25: 1 mg via INTRAVENOUS
  Filled 2014-08-24 (×5): qty 1

## 2014-08-24 MED ORDER — LIDOCAINE HCL (CARDIAC) 20 MG/ML IV SOLN
INTRAVENOUS | Status: AC
  Filled 2014-08-24: qty 5

## 2014-08-24 MED ORDER — HYDRALAZINE HCL 20 MG/ML IJ SOLN: 20.0000 mg | INTRAMUSCULAR | Status: DC | PRN

## 2014-08-24 MED ORDER — PNEUMOCOCCAL VAC POLYVALENT 25 MCG/0.5ML IJ INJ
0.5000 mL | INJECTION | INTRAMUSCULAR | Status: AC
  Administered 2014-08-25: 0.5 mL via INTRAMUSCULAR
  Filled 2014-08-24 (×2): qty 0.5

## 2014-08-24 MED ORDER — LACTATED RINGERS IV SOLN
INTRAVENOUS | Status: DC | PRN
  Administered 2014-08-24: 17:00:00 via INTRAVENOUS

## 2014-08-24 MED ORDER — DEXTROSE 50 % IV SOLN
INTRAVENOUS | Status: AC
  Filled 2014-08-24: qty 50

## 2014-08-24 MED ORDER — FENTANYL CITRATE (PF) 250 MCG/5ML IJ SOLN
INTRAMUSCULAR | Status: AC
  Filled 2014-08-24: qty 5

## 2014-08-24 MED ORDER — GLYCOPYRROLATE 0.2 MG/ML IJ SOLN
INTRAMUSCULAR | Status: AC
  Filled 2014-08-24: qty 3

## 2014-08-24 MED ORDER — NEOSTIGMINE METHYLSULFATE 10 MG/10ML IV SOLN
INTRAVENOUS | Status: DC | PRN
  Administered 2014-08-24: 5 mg via INTRAVENOUS

## 2014-08-24 MED ORDER — ONDANSETRON HCL 4 MG PO TABS: 4.0000 mg | ORAL_TABLET | Freq: Four times a day (QID) | ORAL | Status: DC | PRN

## 2014-08-24 MED ORDER — ONDANSETRON HCL 4 MG/2ML IJ SOLN: 4.0000 mg | Freq: Once | INTRAMUSCULAR | Status: DC | PRN

## 2014-08-24 MED ORDER — LIDOCAINE HCL (CARDIAC) 20 MG/ML IV SOLN
INTRAVENOUS | Status: DC | PRN
  Administered 2014-08-24: 80 mg via INTRAVENOUS

## 2014-08-24 MED ORDER — SUCCINYLCHOLINE CHLORIDE 20 MG/ML IJ SOLN
INTRAMUSCULAR | Status: DC | PRN
  Administered 2014-08-24: 100 mg via INTRAVENOUS

## 2014-08-24 MED ORDER — ACETAMINOPHEN 10 MG/ML IV SOLN
1000.0000 mg | Freq: Once | INTRAVENOUS | Status: AC
  Administered 2014-08-24: 1000 mg via INTRAVENOUS
  Filled 2014-08-24: qty 100

## 2014-08-24 MED ORDER — 0.9 % SODIUM CHLORIDE (POUR BTL) OPTIME
TOPICAL | Status: DC | PRN
  Administered 2014-08-24 (×8): 1000 mL

## 2014-08-24 MED ORDER — PIPERACILLIN-TAZOBACTAM 3.375 G IVPB
3.3750 g | Freq: Three times a day (TID) | INTRAVENOUS | Status: DC
  Administered 2014-08-24 – 2014-08-30 (×17): 3.375 g via INTRAVENOUS
  Filled 2014-08-24 (×24): qty 50

## 2014-08-24 MED ORDER — FENTANYL CITRATE (PF) 100 MCG/2ML IJ SOLN
50.0000 ug | Freq: Once | INTRAMUSCULAR | Status: AC
  Administered 2014-08-24: 50 ug via INTRAVENOUS
  Filled 2014-08-24: qty 2

## 2014-08-24 MED ORDER — PIPERACILLIN-TAZOBACTAM 3.375 G IVPB 30 MIN
3.3750 g | Freq: Once | INTRAVENOUS | Status: AC
  Administered 2014-08-24: 3.375 g via INTRAVENOUS
  Filled 2014-08-24: qty 50

## 2014-08-24 MED ORDER — LACTATED RINGERS IV SOLN
INTRAVENOUS | Status: DC | PRN
  Administered 2014-08-24 (×2): via INTRAVENOUS

## 2014-08-24 MED ORDER — ROCURONIUM BROMIDE 100 MG/10ML IV SOLN
INTRAVENOUS | Status: DC | PRN
  Administered 2014-08-24: 10 mg via INTRAVENOUS
  Administered 2014-08-24: 30 mg via INTRAVENOUS
  Administered 2014-08-24: 10 mg via INTRAVENOUS

## 2014-08-24 MED ORDER — ROCURONIUM BROMIDE 100 MG/10ML IV SOLN
INTRAVENOUS | Status: AC
  Filled 2014-08-24: qty 1

## 2014-08-24 MED ORDER — EPHEDRINE SULFATE 50 MG/ML IJ SOLN
INTRAMUSCULAR | Status: DC | PRN
  Administered 2014-08-24: 10 mg via INTRAVENOUS

## 2014-08-24 MED ORDER — SODIUM CHLORIDE 0.9 % IV BOLUS (SEPSIS)
500.0000 mL | Freq: Once | INTRAVENOUS | Status: AC
  Administered 2014-08-24: 500 mL via INTRAVENOUS

## 2014-08-24 MED ORDER — DEXTROSE 50 % IV SOLN
1.0000 | INTRAVENOUS | Status: AC
  Administered 2014-08-24: 50 mL via INTRAVENOUS

## 2014-08-24 MED ORDER — PROPOFOL 10 MG/ML IV BOLUS
INTRAVENOUS | Status: DC | PRN
  Administered 2014-08-24: 140 mg via INTRAVENOUS

## 2014-08-24 MED ORDER — NEOSTIGMINE METHYLSULFATE 10 MG/10ML IV SOLN
INTRAVENOUS | Status: AC
  Filled 2014-08-24: qty 1

## 2014-08-24 MED ORDER — FENTANYL CITRATE (PF) 100 MCG/2ML IJ SOLN
INTRAMUSCULAR | Status: DC | PRN
  Administered 2014-08-24 (×3): 50 ug via INTRAVENOUS

## 2014-08-24 MED ORDER — PHENYLEPHRINE HCL 10 MG/ML IJ SOLN
10.0000 mg | INTRAVENOUS | Status: DC | PRN
  Administered 2014-08-24: 10 ug/min via INTRAVENOUS

## 2014-08-24 MED ORDER — HEPARIN SODIUM (PORCINE) 5000 UNIT/ML IJ SOLN
5000.0000 [IU] | Freq: Three times a day (TID) | INTRAMUSCULAR | Status: DC
  Administered 2014-08-25 – 2014-08-26 (×3): 5000 [IU] via SUBCUTANEOUS
  Filled 2014-08-24 (×3): qty 1

## 2014-08-24 MED ORDER — IOHEXOL 300 MG/ML  SOLN
80.0000 mL | Freq: Once | INTRAMUSCULAR | Status: AC | PRN
  Administered 2014-08-24: 80 mL via INTRAVENOUS

## 2014-08-24 SURGICAL SUPPLY — 46 items
APPLICATOR COTTON TIP 6IN STRL (MISCELLANEOUS) ×2 IMPLANT
BLADE EXTENDED COATED 6.5IN (ELECTRODE) ×2 IMPLANT
BLADE HEX COATED 2.75 (ELECTRODE) ×3 IMPLANT
BNDG GAUZE ELAST 4 BULKY (GAUZE/BANDAGES/DRESSINGS) ×2 IMPLANT
BRR ADH 5X3 SEPRAFILM 6 SHT (MISCELLANEOUS) ×1
COVER MAYO STAND STRL (DRAPES) ×1 IMPLANT
DRAIN CHANNEL 19F RND (DRAIN) IMPLANT
DRAPE LAPAROSCOPIC ABDOMINAL (DRAPES) ×3 IMPLANT
DRAPE UTILITY XL STRL (DRAPES) ×3 IMPLANT
DRAPE WARM FLUID 44X44 (DRAPE) ×3 IMPLANT
ELECT REM PT RETURN 9FT ADLT (ELECTROSURGICAL) ×3
ELECTRODE REM PT RTRN 9FT ADLT (ELECTROSURGICAL) ×1 IMPLANT
EVACUATOR DRAINAGE 10X20 100CC (DRAIN) IMPLANT
EVACUATOR SILICONE 100CC (DRAIN)
GAUZE SPONGE 4X4 12PLY STRL (GAUZE/BANDAGES/DRESSINGS) ×3 IMPLANT
GLOVE ECLIPSE 8.0 STRL XLNG CF (GLOVE) ×3 IMPLANT
GLOVE INDICATOR 8.0 STRL GRN (GLOVE) ×6 IMPLANT
GOWN STRL REUS W/TWL XL LVL3 (GOWN DISPOSABLE) ×6 IMPLANT
KIT BASIN OR (CUSTOM PROCEDURE TRAY) ×3 IMPLANT
NS IRRIG 1000ML POUR BTL (IV SOLUTION) ×3 IMPLANT
PACK COLON (CUSTOM PROCEDURE TRAY) ×2 IMPLANT
PACK GENERAL/GYN (CUSTOM PROCEDURE TRAY) ×3 IMPLANT
PAD ABD 8X10 STRL (GAUZE/BANDAGES/DRESSINGS) ×2 IMPLANT
SEPRAFILM PROCEDURAL PACK 3X5 (MISCELLANEOUS) ×2 IMPLANT
SPONGE LAP 18X18 X RAY DECT (DISPOSABLE) ×8 IMPLANT
STAPLER CUT CVD 40MM BLUE (STAPLE) ×2 IMPLANT
STAPLER PROXIMATE 75MM BLUE (STAPLE) ×2 IMPLANT
STAPLER VISISTAT 35W (STAPLE) ×3 IMPLANT
SUCTION POOLE TIP (SUCTIONS) ×3 IMPLANT
SUT PDS AB 1 CTX 36 (SUTURE) IMPLANT
SUT PDS AB 1 TP1 96 (SUTURE) ×4 IMPLANT
SUT PROLENE 2 0 SH DA (SUTURE) ×2 IMPLANT
SUT SILK 2 0 (SUTURE) ×3
SUT SILK 2 0 SH CR/8 (SUTURE) ×3 IMPLANT
SUT SILK 2-0 18XBRD TIE 12 (SUTURE) ×1 IMPLANT
SUT SILK 3 0 (SUTURE) ×3
SUT SILK 3 0 SH CR/8 (SUTURE) ×3 IMPLANT
SUT SILK 3-0 18XBRD TIE 12 (SUTURE) ×1 IMPLANT
SUT VIC AB 2-0 SH 18 (SUTURE) ×2 IMPLANT
SUT VIC AB 3-0 SH 18 (SUTURE) ×4 IMPLANT
SUT VICRYL 2 0 18  UND BR (SUTURE)
SUT VICRYL 2 0 18 UND BR (SUTURE) IMPLANT
TAPE CLOTH SURG 6X10 WHT LF (GAUZE/BANDAGES/DRESSINGS) ×2 IMPLANT
TOWEL OR 17X26 10 PK STRL BLUE (TOWEL DISPOSABLE) ×4 IMPLANT
TOWEL OR NON WOVEN STRL DISP B (DISPOSABLE) ×3 IMPLANT
TRAY FOLEY W/METER SILVER 14FR (SET/KITS/TRAYS/PACK) ×3 IMPLANT

## 2014-08-24 NOTE — ED Notes (Signed)
Consent signed, pt being taken to OR

## 2014-08-24 NOTE — ED Provider Notes (Signed)
CSN: [id number]     Arrival date & time 08/24/14  1101 History   First MD Initiated Contact with Patient 08/24/14 1111     Chief Complaint  Patient presents with  . Abdominal Pain     (Consider location/radiation/quality/duration/timing/severity/associated sxs/prior Treatment) Patient is a 81 y.o. male presenting with abdominal pain. The history is provided by the patient.  Abdominal Pain Pain location: lower abdomen. Pain quality: cramping   Pain radiates to:  Does not radiate Pain severity:  Mild Onset quality:  Sudden Duration:  2 hours Timing:  Constant Progression:  Unchanged Chronicity:  New Context comment:  At rest Relieved by:  Nothing Worsened by:  Nothing tried Ineffective treatments:  None tried Associated symptoms: no chest pain, no cough, no diarrhea, no dysuria, no fever, no hematuria, no nausea, no shortness of breath and no vomiting     Past Medical History  Diagnosis Date  . Prostate ca     prostate ca dx 20 yrs ago  . Hypertension   . Hyperlipidemia   . Hyperthyroidism   . Anxiety   . Dysrhythmia   . Heart murmur   . Constipation   . GERD (gastroesophageal reflux disease)   . Diabetes mellitus     Type II  . CAD (coronary artery disease)     08/25/03 cath [facility]): NL coronaries, EF 70-75%    Past Surgical History  Procedure Laterality Date  . Appendectomy    . Penile prosthesis implant    . Hernia repair Left   . Colonoscopy    . Inguinal hernia repair  07/14/2014    with mesh  . Prostatectomy    . Inguinal hernia repair Right 07/14/2014    Procedure: OPEN REPAIR RIGHT INGUINAL HERNIA ;  Surgeon: Fanny Skates, MD;  Location: [facility];  Service: General;  Laterality: Right;  . Insertion of mesh Right 07/14/2014    Procedure: INSERTION OF MESH;  Surgeon: Fanny Skates, MD;  Location: [facility];  Service: General;  Laterality: Right;   History reviewed. No pertinent family history. History  Substance Use Topics  . Smoking status: Former Smoker -- 35  years  . Smokeless tobacco: Never Used     Comment: 07/07/14  . Alcohol Use: No    Review of Systems  Constitutional: Negative for fever.  HENT: Negative for drooling and rhinorrhea.   Eyes: Negative for pain.  Respiratory: Negative for cough and shortness of breath.   Cardiovascular: Negative for chest pain and leg swelling.  Gastrointestinal: Positive for abdominal pain. Negative for nausea, vomiting and diarrhea.  Genitourinary: Negative for dysuria and hematuria.  Musculoskeletal: Negative for gait problem and neck pain.  Skin: Negative for color change.  Neurological: Negative for numbness and headaches.  Hematological: Negative for adenopathy.  Psychiatric/Behavioral: Negative for behavioral problems.  All other systems reviewed and are negative.     Allergies  Review of patient's allergies indicates no known allergies.  Home Medications   Prior to Admission medications   Medication Sig Start Date End Date Taking? Authorizing Provider  aspirin EC 81 MG tablet Take 81 mg by mouth at bedtime.    Historical Provider, MD  atorvastatin (LIPITOR) 20 MG tablet Take 20 mg by mouth at bedtime.     Historical Provider, MD  Cholecalciferol (VITAMIN D PO) Take 1 tablet by mouth daily.    Historical Provider, MD  [employer]S SENNA PLUS 8.6-50 MG per tablet TAKE 2 TABLETS BY MOUTH 2 (TWO) TIMES DAILY. Patient not taking: Reported on 07/07/2014  06/21/14   Wyatt Portela, MD  erythromycin ophthalmic ointment Place into the left eye 4 (four) times daily. Apply 1/2 inch to left eye 4 times a day for 5 days. Patient taking differently: Place 1 application into the right eye daily as needed (irritation).  09/17/12   Maryanna Shape, NP  gemfibrozil (LOPID) 600 MG tablet Take 600 mg by mouth 2 (two) times daily before a meal.    Historical Provider, MD  glipiZIDE (GLUCOTROL XL) 2.5 MG 24 hr tablet Take 2.5 mg by mouth daily. 06/24/14   Historical Provider, MD  HYDROcodone-acetaminophen (NORCO) 5-325 MG  per tablet Take 1-2 tablets by mouth every 6 (six) hours as needed for moderate pain or severe pain. 07/15/14   Fanny Skates, MD  Lactulose 20 GM/30ML SOLN Take 30 mLs (20 g total) by mouth 2 (two) times daily as needed (For constipation). Patient taking differently: Take 30 mLs by mouth daily as needed (For constipation).  10/16/13   Maryanna Shape, NP  losartan-hydrochlorothiazide (HYZAAR) 100-25 MG per tablet Take 0.5 tablets by mouth daily.     Historical Provider, MD  metoCLOPramide (REGLAN) 5 MG tablet Take 5 mg by mouth 4 (four) times daily -  before meals and at bedtime.  05/27/14   Historical Provider, MD  omeprazole (PRILOSEC) 20 MG capsule Take 20 mg by mouth daily before breakfast.     Historical Provider, MD  OVER THE COUNTER MEDICATION Place 1 drop into the left eye at bedtime as needed (irritation). Over the counter eye drops    Historical Provider, MD  predniSONE (DELTASONE) 5 MG tablet TAKE 1 TABLET BY MOUTH DAILY 07/23/14   Wyatt Portela, MD  risedronate (ACTONEL) 35 MG tablet Take 35 mg by mouth every 7 (seven) days. On Mondays 02/18/14   Historical Provider, MD  sulindac (CLINORIL) 200 MG tablet Take 200 mg by mouth daily.     Historical Provider, MD  vitamin C (ASCORBIC ACID) 500 MG tablet Take 500 mg by mouth daily.    Historical Provider, MD  ZYTIGA 250 MG tablet TAKE 4 TABLETS BY MOUTH DAILY ON AN EMPTY STOMACH 1 HOUR BEFORE OR 2 HOURS AFTER A MEAL 08/01/14   Wyatt Portela, MD   BP 152/65 mmHg  Pulse 65  Temp(Src) 98.4 F (36.9 C) (Oral)  Resp 18  SpO2 98% Physical Exam  Constitutional: He is oriented to person, place, and time. He appears well-developed and well-nourished.  HENT:  Head: Normocephalic and atraumatic.  Right Ear: External ear normal.  Left Ear: External ear normal.  Nose: Nose normal.  Mouth/Throat: Oropharynx is clear and moist. No oropharyngeal exudate.  Eyes: Conjunctivae and EOM are normal. Pupils are equal, round, and reactive to light.  Neck:  Normal range of motion. Neck supple.  Cardiovascular: Normal rate, regular rhythm, normal heart sounds and intact distal pulses.  Exam reveals no gallop and no friction rub.   No murmur heard. Pulmonary/Chest: Effort normal and breath sounds normal. No respiratory distress. He has no wheezes.  Abdominal: Soft. Bowel sounds are normal. He exhibits no distension (mild nonspecific lower abdominal tenderness.). There is tenderness. There is no rebound and no guarding.  Genitourinary:  Normal palpation of testicle without pain. Patient reports other testicle never descended.  No obvious new inguinal hernia.  No obvious inguinal tenderness or lymphadenopathy.  Musculoskeletal: Normal range of motion. He exhibits no edema or tenderness.  Neurological: He is alert and oriented to person, place, and time.  Skin: Skin  is warm and dry.  Psychiatric: He has a normal mood and affect. His behavior is normal.  Nursing note and vitals reviewed.   ED Course  Procedures (including critical care time) Labs Review Labs Reviewed  CBC WITH DIFFERENTIAL/PLATELET - Abnormal; Notable for the following:    Neutrophils Relative % 88 (*)    Lymphocytes Relative 11 (*)    Monocytes Relative 1 (*)    Lymphs Abs 0.5 (*)    Monocytes Absolute 0.0 (*)    All other components within normal limits  COMPREHENSIVE METABOLIC PANEL - Abnormal; Notable for the following:    Creatinine, Ser 1.37 (*)    Calcium 8.8 (*)    ALT 13 (*)    Total Bilirubin 1.7 (*)    GFR calc non Af Amer 45 (*)    GFR calc Af Amer 53 (*)    All other components within normal limits  LIPASE, BLOOD - Abnormal; Notable for the following:    Lipase 19 (*)    All other components within normal limits  URINALYSIS, ROUTINE W REFLEX MICROSCOPIC - Abnormal; Notable for the following:    Specific Gravity, Urine 1.035 (*)    Hgb urine dipstick TRACE (*)    Nitrite POSITIVE (*)    Leukocytes, UA SMALL (*)    All other components within normal  limits  URINE MICROSCOPIC-ADD ON - Abnormal; Notable for the following:    Bacteria, UA MANY (*)    All other components within normal limits  URINE CULTURE    Imaging Review Ct Abdomen Pelvis W Contrast  08/24/2014   ADDENDUM REPORT: 08/24/2014 14:15  ADDENDUM: Critical Value/emergent results were called by telephone at the time of interpretation on 08/24/2014 at 2:10 pm to Dr. Pamella Pert , who verbally acknowledged these results.   Electronically Signed   By: Margarette Canada M.D.   On: 08/24/2014 14:15   08/24/2014   CLINICAL DATA:  80 year old male with bilateral lower abdominal and pelvic pain. History of prostate cancer, appendectomy and inguinal hernia repair.  EXAM: CT ABDOMEN AND PELVIS WITH CONTRAST  TECHNIQUE: Multidetector CT imaging of the abdomen and pelvis was performed using the standard protocol following bolus administration of intravenous contrast.  CONTRAST:  54mL OMNIPAQUE IOHEXOL 300 MG/ML  SOLN  COMPARISON:  07/06/2011 CT  FINDINGS: Lower chest:  Mild bibasilar atelectasis identified.  Hepatobiliary: The liver and gallbladder are unremarkable. There is no evidence of biliary dilatation.  Pancreas: Unremarkable  Spleen: Unremarkable  Adrenals/Urinary Tract: Moderate renal cortical atrophy noted. A 12 mm nonobstructing calculus within the left lower kidney and right lower pole renal cyst noted. There is no evidence of hydronephrosis. The adrenal glands and bladder are unremarkable.  Stomach/Bowel: Pneumoperitoneum is identified, with the largest focus of gas in the lower pelvis compatible with bowel perforation, which appears to originate from the distal sigmoid colon. Diffuse circumferential colonic and small bowel wall thickening is noted loops which may represent colitis/enteritis or reactive. Small amount of free fluid within the abdomen is noted. No discrete abscess is noted.  Vascular/Lymphatic: No enlarged lymph nodes or abdominal aortic aneurysm.  Reproductive: Prostatectomy  changes and penile prosthesis noted.  Musculoskeletal: No acute or suspicious abnormalities. Multilevel degenerative disc disease and spondylosis within the lumbar spine identified.  IMPRESSION: Pneumoperitoneum compatible with bowel perforation. The site of perforation appears to be in the distal sigmoid colon. Small amount of ascites without abscess.  Diffuse colonic and small bowel wall thickening which may represent colitis/enteritis vs reactive.  Electronically  Signed: By: Margarette Canada M.D. On: 08/24/2014 14:11   Dg Chest Port 1 View  08/24/2014   CLINICAL DATA:  Preop  EXAM: PORTABLE CHEST - 1 VIEW  COMPARISON:  04/01/2011  FINDINGS: Lungs are very under aerated with bibasilar atelectasis. Normal heart size. Left midlung nodular density has developed measuring 14 mm. No pneumothorax.  IMPRESSION: New left midlung nodular density. Upright PA and lateral chest radiographs are recommended.  Bibasilar atelectasis.   Electronically Signed   By: Marybelle Killings M.D.   On: 08/24/2014 15:10     EKG Interpretation   Date/Time:  Sunday Aug 24 2014 11:27:35 EDT Ventricular Rate:  56 PR Interval:  143 QRS Duration: 90 QT Interval:  451 QTC Calculation: 435 R Axis:   -38 Text Interpretation:  Sinus rhythm LVH with secondary repolarization  abnormality Anterior Q waves, possibly due to LVH No significant change  since last tracing Confirmed by My Rinke  MD, Doyce Stonehouse ([id number]) on 08/24/2014  11:34:11 AM     CRITICAL CARE Performed by: Pamella Pert, S Total critical care time: 30 min Critical care time was exclusive of separately billable procedures and treating other patients. Critical care was necessary to treat or prevent imminent or life-threatening deterioration. Critical care was time spent personally by me on the following activities: development of treatment plan with patient and/or surrogate as well as nursing, discussions with consultants, evaluation of patient's response to treatment,  examination of patient, obtaining history from patient or surrogate, ordering and performing treatments and interventions, ordering and review of laboratory studies, ordering and review of radiographic studies, pulse oximetry and re-evaluation of patient's condition.  MDM   Final diagnoses:  Lower abdominal pain  Pre-op chest exam  Bowel perforation  UTI (lower urinary tract infection)    11:36 AM 81 y.o. male w hx of CAD, DM, HLP, s/p recent right inguinal hernia repair on 07/14/14 w/ mesh by Dr. Renelda Loma who presents with lower abdominal pain which began abruptly several hours ago on his way to church. He describes it as a cramping pain. He states that he has a history of some mild constipation but usually has a bowel movement daily and last had a bowel movement yesterday. He denies any fevers, vomiting, or diarrhea. Vital signs unremarkable here. No obvious new inguinal hernia or testicular pain on my exam.  2:42 PM patient found to have bowel perforation. Patient and family notified. I ordered IV Zosyn. Discussed case with on-call for surgery who will evaluate. patient currently nontoxic appearing and vital signs are stable. He appears comfortable on exam.  3:52 PM discussed antibiotic coverage for UTI with the pharmacist. She thinks Zosyn should be sufficient for now. No previous urine cultures. I did send a urine culture.  Pamella Pert, MD 08/24/14 [phone]

## 2014-08-24 NOTE — H&P (Addendum)
Shane [name] is an 81 y.o. male.   Chief Complaint:   Severe lower abdominal pain HPI: this is an 81 year old man who awoke this morning with lower abdominal pain that progressively worsened. He noted for the past week that he was having problems with constipation and smaller caliber stools. No fever or chills. No previous illnesses over the past month. He presented to the emergency department. CT scan demonstrated pneumoperitoneum and the pelvic area as well as some above the liver were some free fluid. There were inflammatory changes to the sigmoid colon and small bowel in the region of the pelvis. There also appeared to be some diffuse colonic thickening. He's had no diarrhea prior to this. He does have metastatic prostate cancer and is under the care of Dr. Osker Mason.  Approximate 4 weeks ago,he had a right inguinal hernia repair with mesh done by Dr. Dalbert Batman and has been doing well from that.  Past Medical History  Diagnosis Date  . Prostate ca     prostate ca dx 20 yrs ago  . Hypertension   . Hyperlipidemia   . Hyperthyroidism   . Anxiety   . Dysrhythmia   . Heart murmur   . Constipation   . GERD (gastroesophageal reflux disease)   . Diabetes mellitus     Type II  . CAD (coronary artery disease)     08/25/03 cath [facility]): NL coronaries, EF 70-75%     Past Surgical History  Procedure Laterality Date  . Appendectomy    . Penile prosthesis implant    . Hernia repair Left   . Colonoscopy    . Inguinal hernia repair  07/14/2014    with mesh  . Prostatectomy    . Inguinal hernia repair Right 07/14/2014    Procedure: OPEN REPAIR RIGHT INGUINAL HERNIA ;  Surgeon: Fanny Skates, MD;  Location: [facility];  Service: General;  Laterality: Right;  . Insertion of mesh Right 07/14/2014    Procedure: INSERTION OF MESH;  Surgeon: Fanny Skates, MD;  Location: [facility];  Service: General;  Laterality: Right;    History reviewed. No pertinent family history. Social History:  reports that he has quit  smoking. He has never used smokeless tobacco. He reports that he does not drink alcohol or use illicit drugs.  Allergies: No Known Allergies   (Not in a hospital admission)  Results for orders placed or performed during the hospital encounter of 08/24/14 (from the past 48 hour(s))  CBC with Differential/Platelet     Status: Abnormal   Collection Time: 08/24/14 12:13 PM  Result Value Ref Range   WBC 4.3 4.0 - 10.5 K/uL   RBC 4.78 4.22 - 5.81 MIL/uL   Hemoglobin 14.4 13.0 - 17.0 g/dL   HCT 44.3 39.0 - 52.0 %   MCV 92.7 78.0 - 100.0 fL   MCH 30.1 26.0 - 34.0 pg   MCHC 32.5 30.0 - 36.0 g/dL   RDW 14.2 11.5 - 15.5 %   Platelets  150 - 400 K/uL    PLATELET CLUMPS NOTED ON SMEAR, COUNT APPEARS DECREASED   Neutrophils Relative % 88 (H) 43 - 77 %   Lymphocytes Relative 11 (L) 12 - 46 %   Monocytes Relative 1 (L) 3 - 12 %   Eosinophils Relative 0 0 - 5 %   Basophils Relative 0 0 - 1 %   Neutro Abs 3.8 1.7 - 7.7 K/uL   Lymphs Abs 0.5 (L) 0.7 - 4.0 K/uL   Monocytes Absolute 0.0 (L)  0.1 - 1.0 K/uL   Eosinophils Absolute 0.0 0.0 - 0.7 K/uL   Basophils Absolute 0.0 0.0 - 0.1 K/uL   WBC Morphology INCREASED BANDS (>20% BANDS)   Comprehensive metabolic panel     Status: Abnormal   Collection Time: 08/24/14 12:13 PM  Result Value Ref Range   Sodium 142 135 - 145 mmol/L   Potassium 3.7 3.5 - 5.1 mmol/L   Chloride 104 101 - 111 mmol/L   CO2 26 22 - 32 mmol/L   Glucose, Bld 92 65 - 99 mg/dL   BUN 17 6 - 20 mg/dL   Creatinine, Ser 1.37 (H) 0.61 - 1.24 mg/dL   Calcium 8.8 (L) 8.9 - 10.3 mg/dL   Total Protein 6.8 6.5 - 8.1 g/dL   Albumin 3.5 3.5 - 5.0 g/dL   AST 24 15 - 41 U/L   ALT 13 (L) 17 - 63 U/L   Alkaline Phosphatase 62 38 - 126 U/L   Total Bilirubin 1.7 (H) 0.3 - 1.2 mg/dL   GFR calc non Af Amer 45 (L) >60 mL/min   GFR calc Af Amer 53 (L) >60 mL/min    Comment: (NOTE) The eGFR has been calculated using the CKD EPI equation. This calculation has not been validated in all clinical  situations. eGFR's persistently <60 mL/min signify possible Chronic Kidney Disease.    Anion gap 12 5 - 15  Lipase, blood     Status: Abnormal   Collection Time: 08/24/14 12:13 PM  Result Value Ref Range   Lipase 19 (L) 22 - 51 U/L  Urinalysis, Routine w reflex microscopic     Status: Abnormal   Collection Time: 08/24/14  1:55 PM  Result Value Ref Range   Color, Urine YELLOW YELLOW   APPearance CLEAR CLEAR   Specific Gravity, Urine 1.035 (H) 1.005 - 1.030   pH 6.0 5.0 - 8.0   Glucose, UA NEGATIVE NEGATIVE mg/dL   Hgb urine dipstick TRACE (A) NEGATIVE   Bilirubin Urine NEGATIVE NEGATIVE   Ketones, ur NEGATIVE NEGATIVE mg/dL   Protein, ur NEGATIVE NEGATIVE mg/dL   Urobilinogen, UA 0.2 0.0 - 1.0 mg/dL   Nitrite POSITIVE (A) NEGATIVE   Leukocytes, UA SMALL (A) NEGATIVE  Urine microscopic-add on     Status: Abnormal   Collection Time: 08/24/14  1:55 PM  Result Value Ref Range   WBC, UA 3-6 <3 WBC/hpf   RBC / HPF 0-2 <3 RBC/hpf   Bacteria, UA MANY (A) RARE   Ct Abdomen Pelvis W Contrast  08/24/2014   ADDENDUM REPORT: 08/24/2014 14:15  ADDENDUM: Critical Value/emergent results were called by telephone at the time of interpretation on 08/24/2014 at 2:10 pm to Dr. Pamella Pert , who verbally acknowledged these results.   Electronically Signed   By: Margarette Canada M.D.   On: 08/24/2014 14:15   08/24/2014   CLINICAL DATA:  81 year old male with bilateral lower abdominal and pelvic pain. History of prostate cancer, appendectomy and inguinal hernia repair.  EXAM: CT ABDOMEN AND PELVIS WITH CONTRAST  TECHNIQUE: Multidetector CT imaging of the abdomen and pelvis was performed using the standard protocol following bolus administration of intravenous contrast.  CONTRAST:  74m OMNIPAQUE IOHEXOL 300 MG/ML  SOLN  COMPARISON:  07/06/2011 CT  FINDINGS: Lower chest:  Mild bibasilar atelectasis identified.  Hepatobiliary: The liver and gallbladder are unremarkable. There is no evidence of biliary  dilatation.  Pancreas: Unremarkable  Spleen: Unremarkable  Adrenals/Urinary Tract: Moderate renal cortical atrophy noted. A 12 mm nonobstructing calculus within  the left lower kidney and right lower pole renal cyst noted. There is no evidence of hydronephrosis. The adrenal glands and bladder are unremarkable.  Stomach/Bowel: Pneumoperitoneum is identified, with the largest focus of gas in the lower pelvis compatible with bowel perforation, which appears to originate from the distal sigmoid colon. Diffuse circumferential colonic and small bowel wall thickening is noted loops which may represent colitis/enteritis or reactive. Small amount of free fluid within the abdomen is noted. No discrete abscess is noted.  Vascular/Lymphatic: No enlarged lymph nodes or abdominal aortic aneurysm.  Reproductive: Prostatectomy changes and penile prosthesis noted.  Musculoskeletal: No acute or suspicious abnormalities. Multilevel degenerative disc disease and spondylosis within the lumbar spine identified.  IMPRESSION: Pneumoperitoneum compatible with bowel perforation. The site of perforation appears to be in the distal sigmoid colon. Small amount of ascites without abscess.  Diffuse colonic and small bowel wall thickening which may represent colitis/enteritis vs reactive.  Electronically Signed: By: Margarette Canada M.D. On: 08/24/2014 14:11   Dg Chest Port 1 View  08/24/2014   CLINICAL DATA:  Preop  EXAM: PORTABLE CHEST - 1 VIEW  COMPARISON:  04/01/2011  FINDINGS: Lungs are very under aerated with bibasilar atelectasis. Normal heart size. Left midlung nodular density has developed measuring 14 mm. No pneumothorax.  IMPRESSION: New left midlung nodular density. Upright PA and lateral chest radiographs are recommended.  Bibasilar atelectasis.   Electronically Signed   By: Marybelle Killings M.D.   On: 08/24/2014 15:10    Review of Systems  Constitutional: Positive for chills. Negative for fever.  Respiratory: Negative for shortness of  breath.   Cardiovascular: Negative for chest pain.  Gastrointestinal: Positive for nausea, vomiting, abdominal pain and constipation. Negative for blood in stool.  Genitourinary:       Urinary incontinence.  Neurological: Negative for focal weakness.  Endo/Heme/Allergies: Does not bruise/bleed easily.    Blood pressure 168/111, pulse 68, temperature 98.4 F (36.9 C), temperature source Oral, resp. rate 18, SpO2 96 %. Physical Exam  Constitutional:  Elderly male who appears acutely ill.  HENT:  Head: Normocephalic and atraumatic.  Cardiovascular: Normal rate.   Intermittent irregular rhythm.  Respiratory: Effort normal and breath sounds normal.  GI: Soft. He exhibits no mass. There is tenderness (lower abdomen). There is guarding (  lower abdomen).  Lower midline scar.  Indented right lower quadrant scar.  Genitourinary:  Right groin wound clean and intact  Musculoskeletal: He exhibits no edema.  Neurological: He is alert.  Skin: Skin is warm and dry.     Assessment/Plan 1. Perforated bowel most likely sigmoid colon from diverticular disease versus small bowel perforation. He appears to be acutely ill and has lower abdominal peritonitis on exam. 2. Metastatic prostate cancer that is asymptomatic.  Plan: I recommended emergency exploratory laparotomy, possible bowel resection and colostomy.  I explained the procedure, rationale, and risks to him and his wife. Risks include but are not limited to bleeding, infection, wound problems, anesthesia, anastomotic leak, need for colostomy and problems with the colostomy, need for reoperative surgery,  injury to intraabominal organs (such as intestine, spleen, kidney, bladder, ureter, etc.), ileus, prolonged recovery.  They seem to understand and agree to proceed.  ROSENBOWER,TODD J 08/24/2014, 3:43 PM

## 2014-08-24 NOTE — Anesthesia Preprocedure Evaluation (Addendum)
Anesthesia Evaluation  Patient identified by MRN, date of birth, ID band Patient awake    Reviewed: Allergy & Precautions, NPO status , Patient's Chart, lab work & pertinent test results  History of Anesthesia Complications Negative for: history of anesthetic complications  Airway Mallampati: II  TM Distance: >3 FB Neck ROM: Full    Dental no notable dental hx. (+) Dental Advisory Given   Pulmonary former smoker,  breath sounds clear to auscultation  Pulmonary exam normal       Cardiovascular hypertension, Pt. on medications Normal cardiovascular exam+ Valvular Problems/Murmurs Rhythm:Regular Rate:Normal     Neuro/Psych PSYCHIATRIC DISORDERS Anxiety negative neurological ROS     GI/Hepatic Neg liver ROS, GERD-  Medicated and Controlled,  Endo/Other  diabetesHyperthyroidism   Renal/GU negative Renal ROS  negative genitourinary   Musculoskeletal negative musculoskeletal ROS (+)   Abdominal   Peds negative pediatric ROS (+)  Hematology negative hematology ROS (+)   Anesthesia Other Findings   Reproductive/Obstetrics negative OB ROS                            Anesthesia Physical Anesthesia Plan  ASA: III and emergent  Anesthesia Plan: General   Post-op Pain Management:    Induction: Intravenous, Rapid sequence and Cricoid pressure planned  Airway Management Planned: Oral ETT  Additional Equipment:   Intra-op Plan:   Post-operative Plan: Extubation in OR and Possible Post-op intubation/ventilation  Informed Consent: I have reviewed the patients History and Physical, chart, labs and discussed the procedure including the risks, benefits and alternatives for the proposed anesthesia with the patient or authorized representative who has indicated his/her understanding and acceptance.   Dental advisory given  Plan Discussed with: CRNA  Anesthesia Plan Comments: (Patient is actively  vomiting yellow bile in the holding area. Discussed risk of aspiration and possible need for post operative ventilation. Will plan on RSI. )       Anesthesia Quick Evaluation

## 2014-08-24 NOTE — Transfer of Care (Signed)
Immediate Anesthesia Transfer of Care Note  Patient: Shane [name]  Procedure(s) Performed: Procedure(s): EXPLORATORY LAPAROTOMY, RESECTION SIGMOID COLON, COLOSTOMY (N/A)  Patient Location: PACU  Anesthesia Type:General  Level of Consciousness: awake, alert  and oriented  Airway & Oxygen Therapy: Patient Spontanous Breathing and Patient connected to face mask oxygen  Post-op Assessment: Report given to RN and Post -op Vital signs reviewed and stable  Post vital signs: Reviewed and stable  Last Vitals:  Filed Vitals:   08/24/14 1545  BP: 127/58  Pulse: 61  Temp:   Resp: 23    Complications: No apparent anesthesia complications

## 2014-08-24 NOTE — Op Note (Signed)
Operative Note  Shane [name] male 81 y.o. 08/24/2014  PREOPERATIVE DX:  Bowel perforation  POSTOPERATIVE DX:  Sigmoid colon perforation with feculent peritonitis; sigmoid colon tumor.  PROCEDURE:   Emergency exploratory laparotomy, sigmoid colectomy, colostomy.         Surgeon: Odis Hollingshead   Assistants: none  Anesthesia: General endotracheal anesthesia  Indications:   This is an 81 year old male with metastatic prostate cancer who presented the emergency department with worsening lower abdominal pain that began this a.m. CT scan demonstrates findings consistent with pneumoperitoneum and he has peritonitis on exam. He is now brought to the operating room for the above procedure.    Procedure Detail:  He is brought to the operating room placed supine on the operating table and a general anesthetic was given. A nasogastric tube was inserted. A Foley was inserted. The bowel wall was sterilely prepped and draped.  Beginning above the umbilicus a midline incision was made down through a lower midline scar to the pubic bone. The subcutaneous tissue and fascia and peritoneum were divided. Perineal caries entered and purulent fluid was noted and evacuated. I examined the pelvis and there was feculent peritonitis with multiple small pieces of stool present.  Inflamed small bowel was also present in the pelvis. The small bowel was packed out of the way and a self retraining retractor was placed. I examined the sigmoid colon and noted a 1 cm hole in the sigmoid colon in its midportion. About 4-5 cm proximal to this was a mass either in the wall of the colon or the lumen of the colon. I mobilized the lateral aspect of the colon by dividing its lateral attachments. Left ureter was identified and preserved. I divided the sigmoid colon at the area of the sigmoid colon rectum junction with the linear cutting stapler. Mesentery was divided with the LigaSure. I then divided the colon at the descending  colon sigmoid colon junction with a linear cutting stapler. The proximal end of the specimen was marked and it was handed off the field and sent to pathology.  I further mobilized the descending colon and the splenic flexure. I then copiously irrigated out the abdominal cavity and picked more pieces of stool out of the pelvis. There is purulent fluid in the right upper quadrant region and in the left gutter. I ran the small bowel and some pockets of purulent fluid were evacuated. I then irrigated the abdominal cavity again and evacuated of fluid. There is no evidence of bleeding or organ injury.  A circular incision through the skin and subcutaneous tissues then made in the left mid abdomen. A cruciate incision was made in the anterior posterior rectus sheaths. The descending colon stump was brought up through this aperture and it is anchored to the fascia in 3 areas with 2-0 Vicryl suture.  Following this, Seprafilm was placed in the abdominal cavity. I then closed the fascia with running #1 double looped PDS suture. The subcutaneous tissue was left open and packed with saline moistened gauze. The colostomy was then matured using 3-0 Vicryl sutures. A colostomy appliance was placed. A bulky dry dressing was placed over the midline incision.  He tolerated the procedure without any apparent complications and was taken to recovery in satisfactory condition.   Estimated Blood Loss:  300 mL          Specimens: sigmoid colon        Complications:  * No complications entered in OR log *  Disposition: PACU - hemodynamically stable.         Condition: stable

## 2014-08-24 NOTE — ED Notes (Signed)
RN IS STARTING AN IV

## 2014-08-24 NOTE — Anesthesia Procedure Notes (Signed)
Procedure Name: Intubation Date/Time: 08/24/2014 4:27 PM Performed by: Noralyn Pick D Pre-anesthesia Checklist: Patient identified, Emergency Drugs available, Suction available and Patient being monitored Patient Re-evaluated:Patient Re-evaluated prior to inductionOxygen Delivery Method: Circle System Utilized Preoxygenation: Pre-oxygenation with 100% oxygen Intubation Type: IV induction, Rapid sequence and Cricoid Pressure applied Laryngoscope Size: Mac and 4 Grade View: Grade II Tube type: Oral Tube size: 7.5 mm Number of attempts: 1 Airway Equipment and Method: Stylet and Oral airway Placement Confirmation: ETT inserted through vocal cords under direct vision,  positive ETCO2 and breath sounds checked- equal and bilateral Secured at: 23 cm Tube secured with: Tape Dental Injury: Teeth and Oropharynx as per pre-operative assessment

## 2014-08-24 NOTE — ED Notes (Signed)
Bed: WA03 Expected date:  Expected time:  Means of arrival:  Comments: EMS-hypotensive 

## 2014-08-24 NOTE — ED Notes (Signed)
Per EMS, pt from home. Pt c/o bil lower quad abdominal pain.  Pt states he feels constipated.  Pt had bm yesterday but was not able to go today.  No n/v.  No black tarry stools.  No fever.  HR decreased in route. EKG completed and stable.  Pt is alert and oriented.  Vitals: 140/52, hr 40's, resp 14, 96%, cbg at home was 135.

## 2014-08-24 NOTE — Anesthesia Postprocedure Evaluation (Signed)
  Anesthesia Post-op Note  Patient: Shane [name]  Procedure(s) Performed: Procedure(s) (LRB): EXPLORATORY LAPAROTOMY, RESECTION SIGMOID COLON, COLOSTOMY (N/A)  Patient Location: PACU  Anesthesia Type: General  Level of Consciousness: awake and alert   Airway and Oxygen Therapy: Patient Spontanous Breathing  Post-op Pain: mild  Post-op Assessment: Post-op Vital signs reviewed, Patient's Cardiovascular Status Stable, Respiratory Function Stable, Patent Airway and No signs of Nausea or vomiting  Last Vitals:  Filed Vitals:   08/24/14 2050  BP: 118/51  Pulse:   Temp: 36.9 C  Resp: 18    Post-op Vital Signs: stable   Complications: No apparent anesthesia complications

## 2014-08-24 NOTE — ED Notes (Signed)
MD at bedside. 

## 2014-08-24 NOTE — ED Notes (Signed)
Pt trying to urinate at this time.

## 2014-08-24 NOTE — Progress Notes (Signed)
CXR shows a new left lung density.  He has known metastatic prostate CA.

## 2014-08-25 ENCOUNTER — Encounter (HOSPITAL_COMMUNITY): Payer: Self-pay | Admitting: General Surgery

## 2014-08-25 DIAGNOSIS — K631 Perforation of intestine (nontraumatic): Secondary | ICD-10-CM | POA: Diagnosis not present

## 2014-08-25 LAB — BASIC METABOLIC PANEL
Anion gap: 9 (ref 5–15)
BUN: 19 mg/dL (ref 6–20)
CHLORIDE: 110 mmol/L (ref 101–111)
CO2: 23 mmol/L (ref 22–32)
CREATININE: 1.71 mg/dL — AB (ref 0.61–1.24)
Calcium: 7.2 mg/dL — ABNORMAL LOW (ref 8.9–10.3)
GFR, EST AFRICAN AMERICAN: 40 mL/min — AB (ref >60)
GFR, EST NON AFRICAN AMERICAN: 35 mL/min — AB (ref >60)
Glucose, Bld: 141 mg/dL — ABNORMAL HIGH (ref 65–99)
Potassium: 3.9 mmol/L (ref 3.5–5.1)
Sodium: 142 mmol/L (ref 135–145)

## 2014-08-25 LAB — CBC
HCT: 36.5 % — ABNORMAL LOW (ref 39.0–52.0)
HEMOGLOBIN: 11.7 g/dL — AB (ref 13.0–17.0)
MCH: 29.5 pg (ref 26.0–34.0)
MCHC: 32.1 g/dL (ref 30.0–36.0)
MCV: 92.2 fL (ref 78.0–100.0)
PLATELETS: 81 10*3/uL — AB (ref 150–400)
RBC: 3.96 MIL/uL — ABNORMAL LOW (ref 4.22–5.81)
RDW: 14.4 % (ref 11.5–15.5)
WBC: 7.9 10*3/uL (ref 4.0–10.5)

## 2014-08-25 LAB — GLUCOSE, CAPILLARY
GLUCOSE-CAPILLARY: 136 mg/dL — AB (ref 65–99)
Glucose-Capillary: 136 mg/dL — ABNORMAL HIGH (ref 65–99)
Glucose-Capillary: 137 mg/dL — ABNORMAL HIGH (ref 65–99)
Glucose-Capillary: 148 mg/dL — ABNORMAL HIGH (ref 65–99)

## 2014-08-25 MED ORDER — VITAMINS A & D EX OINT
TOPICAL_OINTMENT | CUTANEOUS | Status: AC
  Administered 2014-08-25: 21:00:00
  Filled 2014-08-25: qty 5

## 2014-08-25 MED ORDER — LIP MEDEX EX OINT
TOPICAL_OINTMENT | CUTANEOUS | Status: AC
  Administered 2014-08-25: 21:00:00
  Filled 2014-08-25: qty 7

## 2014-08-25 NOTE — Consult Note (Signed)
WOC ostomy consult note Stoma type/location: LLQ Colostomy Stomal assessment/size: 1 and 1/2 inches round, moist and slightly budded Peristomal assessment: intact. Abdomen and parastomal field with several creases and fold.  Stoma is in a flat plane with creases above and below. Treatment options for stomal/peristomal skin: skin barrier ring Output serosanguinous Ostomy pouching: 2pc. 2 and 3/4 inch pouching system.  Will change to 2 and 1/4 inch pouching system with next pouch change. Education provided: Patient and wife are still overwhelmed by operative procedure that resulted in an ostomy.  They have never known anyone with a stoma before.  Curious, but prefer to learn more when they leave the ICU. Understand that there are [facility] nurses who will be visiting and teaching them to care and manage the ostomy. Additionally, if home is the next venue for care, [facility] can assist with the continued learning of new skills. [facility] nursing team will not follow, but will remain available to this patient, the nursing, surgical and medical teams.   Thanks, Maudie Flakes, MSN, RN, [place], [place], [place] [phone])

## 2014-08-25 NOTE — Progress Notes (Signed)
Date:  Aug 25, 2014 U.R. performed for needs and level of care. Will continue to follow for Case Management needs.  Velva Harman, RN, BSN, [place]   [phone]

## 2014-08-25 NOTE — Evaluation (Signed)
Physical Therapy Evaluation Patient Details Name: Shane [name] MRN: [id number] DOB: Sep 13, 1926 Today's Date: 08/25/2014   History of Present Illness  81 yo male admitted 08/24/14  with abdominal pain. Found with Perforated bowel with resection and colostomy formation.    Clinical Impression  Patient motivated to mobilize, limited by abdominal pain. Patient will benefit from PT to address problems  Found under PT Problem  list below.    Follow Up Recommendations  (unless  progresses to a functional level to return home)    Equipment Recommendations  None recommended by PT    Recommendations for Other Services       Precautions / Restrictions Precautions Precautions: Fall Precaution Comments: colostomy, incontinence, needs  mesh briefs and pad to ambulate Restrictions Weight Bearing Restrictions: Yes      Mobility  Bed Mobility Overal bed mobility: Needs Assistance Bed Mobility: Supine to Sit     Supine to sit: Max assist;HOB elevated;+2 for safety/equipment;+2 for physical assistance     General bed mobility comments: cues to roll, assist  to scoot to edge  with bed pad and assist  to right trunk, patient continuaously  holding pillow against abdomen and unable to utiolize  UE's.  Transfers Overall transfer level: Needs assistance Equipment used: 2 person hand held assist Transfers: Sit to/from [facility] Sit to Stand: Mod assist;+2 physical assistance;+2 safety/equipment Stand pivot transfers: Mod assist;+2 physical assistance;+2 safety/equipment       General transfer comment: patient  holding  pillow against abdome, supported at UE's with 2 assist.Pivot steps to recliner, trunk flexed. patient stood from recliner using UE's to push up with mod assist of one  Ambulation/Gait                Stairs            Wheelchair Mobility    Modified Rankin (Stroke Patients Only)       Balance Overall balance assessment: Needs  assistance Sitting-balance support: Feet supported;No upper extremity supported Sitting balance-Leahy Scale: Fair                                       Pertinent Vitals/Pain Pain Assessment: 0-10 Pain Score: 8  Pain Descriptors / Indicators: Contraction;Cramping;Jabbing Pain Intervention(s): Limited activity within patient's tolerance;Monitored during session;Premedicated before session;Repositioned    Home Living Family/patient expects to be discharged to:: Private residence Living Arrangements: Spouse/significant other Available Help at Discharge: Family Type of Home: House Home Access: Stairs to enter   Technical brewer of Steps: 2-3 Home Layout: One level Home Equipment: Cane - single point      Prior Function Level of Independence: Independent               Hand Dominance        Extremity/Trunk Assessment               Lower Extremity Assessment: Generalized weakness      Cervical / Trunk Assessment: Normal  Communication   Communication: No difficulties  Cognition Arousal/Alertness: Awake/alert Behavior During Therapy: WFL for tasks assessed/performed Overall Cognitive Status: Within Functional Limits for tasks assessed                      General Comments      Exercises        Assessment/Plan    PT Assessment Patient needs continued PT services  PT Diagnosis  Difficulty walking;Acute pain;Generalized weakness   PT Problem List Decreased strength;Decreased activity tolerance;Decreased mobility;Cardiopulmonary status limiting activity;Decreased knowledge of precautions;Decreased safety awareness;Decreased knowledge of use of DME;Pain  PT Treatment Interventions DME instruction;Gait training;Functional mobility training;Therapeutic activities;Therapeutic exercise;Patient/family education   PT Goals (Current goals can be found in the Care Plan section) Acute Rehab PT Goals Patient Stated Goal: to get  stronger PT Goal Formulation: With patient Time For Goal Achievement: 09/08/14 Potential to Achieve Goals: Good    Frequency Min 3X/week   Barriers to discharge        Co-evaluation               End of Session   Activity Tolerance: No increased pain;Patient limited by fatigue Patient left: in chair;with call bell/phone within reach;with chair alarm set Nurse Communication: Mobility status         Time: 8871-9597 PT Time Calculation (min) (ACUTE ONLY): 29 min   Charges:   PT Evaluation $Initial PT Evaluation Tier I: 1 Procedure PT Treatments $Therapeutic Activity: 8-22 mins   PT G Codes:        Claretha Cooper 08/25/2014, 11:11 AM Tresa Endo PT [phone]

## 2014-08-25 NOTE — Progress Notes (Signed)
OT Cancellation Note  Patient Details Name: Shane [name] MRN: [id number] DOB: [birth year]   Cancelled Treatment:    Reason Eval/Treat Not Completed: Other (comment)   Noted pt needed significant A with PT this day and has significant pain. Will check on next day for appropriateness of OT to address ADL activity . Thanks, Kari Baars, [place]  Payton Mccallum D 08/25/2014, 12:30 PM

## 2014-08-25 NOTE — Care Management Note (Signed)
Case Management Note  Patient Details  Name: Shane [name] MRN: [id number] Date of Birth: [birth year]  Subjective/Objective:        Perforated bowel with resection and colostomy formation.            Action/Plan:  Home when stable   Expected Discharge Date:     [id number]             Expected Discharge Plan:  Home/Self Care  In-House Referral:  NA  Discharge planning Services  CM Consult  Post Acute Care Choice:  NA Choice offered to:  NA  DME Arranged:    DME Agency:     HH Arranged:    HH Agency:     Status of Service:     Medicare Important Message Given:  Yes Date Medicare IM Given:    Medicare IM give by:    Date Additional Medicare IM Given:    Additional Medicare Important Message give by:     If discussed at [place] of Stay Meetings, dates discussed:    Additional Comments:  [name], RN 08/25/2014, 8:06 AM

## 2014-08-25 NOTE — Progress Notes (Signed)
Patient ID: Shane [name], male   DOB: 1926-09-21, 81 y.o.   MRN: [id number] 1 Day Post-Op  Subjective: Pt states pain not well controlled.  They have only given him 1mg  of morphine.    Objective: Vital signs in last 24 hours: Temp:  [97.8 F (36.6 C)-99.4 F (37.4 C)] 99.4 F (37.4 C) (05/16 0720) Pulse Rate:  [40-80] 80 (05/16 0600) Resp:  [14-24] 24 (05/16 0600) BP: (71-168)/(37-111) 97/48 mmHg (05/16 0600) SpO2:  [84 %-100 %] 97 % (05/16 0600) Weight:  [75 kg (165 lb 5.5 oz)] 75 kg (165 lb 5.5 oz) (05/15 2050)    Intake/Output from previous day: 05/15 0701 - 05/16 0700 In: 4750 [I.V.:4200; IV Piggyback:50] Out: 1040 [Urine:790; Emesis/NG output:100; Blood:150] Intake/Output this shift:    PE: Abd: soft, appropriately tender, wound is packed but clean, ostomy has no output. Stoma is pink and viable.  Few BS, NGT with some bilious output Heart: regular Lungs: CTAB  Lab Results:   Recent Labs  08/24/14 1213 08/25/14 0353  WBC 4.3 7.9  HGB 14.4 11.7*  HCT 44.3 36.5*  PLT PLATELET CLUMPS NOTED ON SMEAR, COUNT APPEARS DECREASED 81*   BMET  Recent Labs  08/24/14 1213 08/25/14 0353  NA 142 142  K 3.7 3.9  CL 104 110  CO2 26 23  GLUCOSE 92 141*  BUN 17 19  CREATININE 1.37* 1.71*  CALCIUM 8.8* 7.2*   PT/INR No results for input(s): LABPROT, INR in the last 72 hours. CMP     Component Value Date/Time   NA 142 08/25/2014 0353   NA 144 06/24/2014 1220   NA 141 07/06/2011 0855   K 3.9 08/25/2014 0353   K 3.9 06/24/2014 1220   K 3.6 07/06/2011 0855   CL 110 08/25/2014 0353   CL 105 09/17/2012 0801   CL 104 07/06/2011 0855   CO2 23 08/25/2014 0353   CO2 26 06/24/2014 1220   CO2 28 07/06/2011 0855   GLUCOSE 141* 08/25/2014 0353   GLUCOSE 138 06/24/2014 1220   GLUCOSE 207* 09/17/2012 0801   GLUCOSE 90 07/06/2011 0855   BUN 19 08/25/2014 0353   BUN 17.4 06/24/2014 1220   BUN 18 07/06/2011 0855   CREATININE 1.71* 08/25/2014 0353   CREATININE 1.3  06/24/2014 1220   CREATININE 1.6* 07/06/2011 0855   CALCIUM 7.2* 08/25/2014 0353   CALCIUM 9.0 06/24/2014 1220   CALCIUM 8.8 07/06/2011 0855   PROT 6.8 08/24/2014 1213   PROT 6.4 06/24/2014 1220   PROT 7.5 07/06/2011 0855   ALBUMIN 3.5 08/24/2014 1213   ALBUMIN 3.2* 06/24/2014 1220   AST 24 08/24/2014 1213   AST 18 06/24/2014 1220   AST 22 07/06/2011 0855   ALT 13* 08/24/2014 1213   ALT 15 06/24/2014 1220   ALT 18 07/06/2011 0855   ALKPHOS 62 08/24/2014 1213   ALKPHOS 52 06/24/2014 1220   ALKPHOS 56 07/06/2011 0855   BILITOT 1.7* 08/24/2014 1213   BILITOT 0.74 06/24/2014 1220   BILITOT 0.80 07/06/2011 0855   GFRNONAA 35* 08/25/2014 0353   GFRAA 40* 08/25/2014 0353   Lipase     Component Value Date/Time   LIPASE 19* 08/24/2014 1213       Studies/Results: Ct Abdomen Pelvis W Contrast  08/24/2014   ADDENDUM REPORT: 08/24/2014 14:15  ADDENDUM: Critical Value/emergent results were called by telephone at the time of interpretation on 08/24/2014 at 2:10 pm to Dr. Pamella Pert , who verbally acknowledged these results.   Electronically Signed  By: Margarette Canada M.D.   On: 08/24/2014 14:15   08/24/2014   CLINICAL DATA:  81 year old male with bilateral lower abdominal and pelvic pain. History of prostate cancer, appendectomy and inguinal hernia repair.  EXAM: CT ABDOMEN AND PELVIS WITH CONTRAST  TECHNIQUE: Multidetector CT imaging of the abdomen and pelvis was performed using the standard protocol following bolus administration of intravenous contrast.  CONTRAST:  46mL OMNIPAQUE IOHEXOL 300 MG/ML  SOLN  COMPARISON:  07/06/2011 CT  FINDINGS: Lower chest:  Mild bibasilar atelectasis identified.  Hepatobiliary: The liver and gallbladder are unremarkable. There is no evidence of biliary dilatation.  Pancreas: Unremarkable  Spleen: Unremarkable  Adrenals/Urinary Tract: Moderate renal cortical atrophy noted. A 12 mm nonobstructing calculus within the left lower kidney and right lower pole  renal cyst noted. There is no evidence of hydronephrosis. The adrenal glands and bladder are unremarkable.  Stomach/Bowel: Pneumoperitoneum is identified, with the largest focus of gas in the lower pelvis compatible with bowel perforation, which appears to originate from the distal sigmoid colon. Diffuse circumferential colonic and small bowel wall thickening is noted loops which may represent colitis/enteritis or reactive. Small amount of free fluid within the abdomen is noted. No discrete abscess is noted.  Vascular/Lymphatic: No enlarged lymph nodes or abdominal aortic aneurysm.  Reproductive: Prostatectomy changes and penile prosthesis noted.  Musculoskeletal: No acute or suspicious abnormalities. Multilevel degenerative disc disease and spondylosis within the lumbar spine identified.  IMPRESSION: Pneumoperitoneum compatible with bowel perforation. The site of perforation appears to be in the distal sigmoid colon. Small amount of ascites without abscess.  Diffuse colonic and small bowel wall thickening which may represent colitis/enteritis vs reactive.  Electronically Signed: By: Margarette Canada M.D. On: 08/24/2014 14:11   Dg Chest Port 1 View  08/24/2014   CLINICAL DATA:  Preop  EXAM: PORTABLE CHEST - 1 VIEW  COMPARISON:  04/01/2011  FINDINGS: Lungs are very under aerated with bibasilar atelectasis. Normal heart size. Left midlung nodular density has developed measuring 14 mm. No pneumothorax.  IMPRESSION: New left midlung nodular density. Upright PA and lateral chest radiographs are recommended.  Bibasilar atelectasis.   Electronically Signed   By: Marybelle Killings M.D.   On: 08/24/2014 15:10    Anti-infectives: Anti-infectives    Start     Dose/Rate Route Frequency Ordered Stop   08/24/14 2200  piperacillin-tazobactam (ZOSYN) IVPB 3.375 g     3.375 g 12.5 mL/hr over 240 Minutes Intravenous Every 8 hours 08/24/14 2038     08/24/14 1415  piperacillin-tazobactam (ZOSYN) IVPB 3.375 g     3.375 g 100 mL/hr  over 30 Minutes Intravenous  Once 08/24/14 1413 08/24/14 1530       Assessment/Plan  POD 1, s/p ex lap with Hartman's procedure for perforated sigmoid colon, mucosal mass, Dr. Zella Richer -cont NGT and NPO.  Hopefully can dc NGT soon -BID dressing changes -WOC, consult for new ostomy -DC foley tomorrow -PT/OT eval -cont in SDU for today DM -SSI HTN -prn hydralazine, but BP soft 90s/50s right now Metastatic prostate cancer DVT prophlyaxis -heparin/SCDs    LOS: 1 day    Estil Vallee E 08/25/2014, 8:03 AM Pager: [phone]

## 2014-08-26 LAB — BASIC METABOLIC PANEL
Anion gap: 8 (ref 5–15)
BUN: 21 mg/dL — AB (ref 6–20)
CO2: 22 mmol/L (ref 22–32)
Calcium: 6.9 mg/dL — ABNORMAL LOW (ref 8.9–10.3)
Chloride: 114 mmol/L — ABNORMAL HIGH (ref 101–111)
Creatinine, Ser: 1.6 mg/dL — ABNORMAL HIGH (ref 0.61–1.24)
GFR, EST AFRICAN AMERICAN: 44 mL/min — AB (ref >60)
GFR, EST NON AFRICAN AMERICAN: 38 mL/min — AB (ref >60)
GLUCOSE: 153 mg/dL — AB (ref 65–99)
Potassium: 4 mmol/L (ref 3.5–5.1)
SODIUM: 144 mmol/L (ref 135–145)

## 2014-08-26 LAB — CBC
HCT: 33.3 % — ABNORMAL LOW (ref 39.0–52.0)
HEMOGLOBIN: 11.2 g/dL — AB (ref 13.0–17.0)
MCH: 30.1 pg (ref 26.0–34.0)
MCHC: 33.6 g/dL (ref 30.0–36.0)
MCV: 89.5 fL (ref 78.0–100.0)
Platelets: 77 10*3/uL — ABNORMAL LOW (ref 150–400)
RBC: 3.72 MIL/uL — ABNORMAL LOW (ref 4.22–5.81)
RDW: 14.7 % (ref 11.5–15.5)
WBC: 19.8 10*3/uL — ABNORMAL HIGH (ref 4.0–10.5)

## 2014-08-26 LAB — GLUCOSE, CAPILLARY
GLUCOSE-CAPILLARY: 148 mg/dL — AB (ref 65–99)
GLUCOSE-CAPILLARY: 111 mg/dL — AB (ref 65–99)
Glucose-Capillary: 112 mg/dL — ABNORMAL HIGH (ref 65–99)
Glucose-Capillary: 119 mg/dL — ABNORMAL HIGH (ref 65–99)
Glucose-Capillary: 123 mg/dL — ABNORMAL HIGH (ref 65–99)
Glucose-Capillary: 123 mg/dL — ABNORMAL HIGH (ref 65–99)
Glucose-Capillary: 125 mg/dL — ABNORMAL HIGH (ref 65–99)
Glucose-Capillary: 130 mg/dL — ABNORMAL HIGH (ref 65–99)

## 2014-08-26 NOTE — Progress Notes (Signed)
Patient is stable at transfer. Report given to nurse.  

## 2014-08-26 NOTE — Evaluation (Signed)
Occupational Therapy Evaluation Patient Details Name: Shane [name] MRN: [id number] DOB: 1926/07/15 Today's Date: 08/26/2014    History of Present Illness 81 yo male admitted 08/24/14  with abdominal pain. Found with Perforated bowel with resection and colostomy formation.     Clinical Impression   Pt was admitted for the above.  He will benefit from skilled OT to increase safety and independence with ADLs.  Pt was independent prior to admission.  He currently needs A x 2 for bed mobility and max A for LB adls.  Goals in acute are for min A to supervision    Follow Up Recommendations  SNF (likely)    Equipment Recommendations  3 in 1 bedside comode (if needed, s/p colostomy)    Recommendations for Other Services       Precautions / Restrictions Precautions Precautions: Fall      Mobility Bed Mobility   Bed Mobility: Rolling;Sidelying to Sit Rolling: Mod assist Sidelying to sit: Mod assist;+2 for safety/equipment       General bed mobility comments: assist with pad to log roll and assist for trunk to sit up  Transfers Overall transfer level: Needs assistance Equipment used: 1 person hand held assist Transfers: Stand Pivot Transfers Sit to Stand: Mod assist Stand pivot transfers: Mod assist       General transfer comment: light mod A for sit to stand and SPT; from elevated surface    Balance                                            ADL Overall ADL's : Needs assistance/impaired     Grooming: Min guard   Upper Body Bathing: Min guard   Lower Body Bathing: Maximal assistance;Sit to/from stand   Upper Body Dressing : Minimal assistance;Sitting   Lower Body Dressing: Maximal assistance;Sit to/from stand   Toilet Transfer: Moderate assistance;Stand-pivot (to recliner)             General ADL Comments: Pt needs min guard for assistance with lines/tubes.  Pt has NG tube in place/colostomy/IVs.  He normally brought leg up on  bed (W sitting position/internal rotation).  He is unable to do this currently.       Vision     Perception     Praxis      Pertinent Vitals/Pain Pain Assessment: Faces Faces Pain Scale: Hurts little more Pain Location: abdomen when rolling Pain Descriptors / Indicators: Aching Pain Intervention(s): Limited activity within patient's tolerance;Monitored during session;Premedicated before session;Repositioned     Hand Dominance     Extremity/Trunk Assessment Upper Extremity Assessment Upper Extremity Assessment: Overall WFL for tasks assessed           Communication Communication Communication: No difficulties   Cognition Arousal/Alertness: Awake/alert Behavior During Therapy: WFL for tasks assessed/performed Overall Cognitive Status: Within Functional Limits for tasks assessed                     General Comments       Exercises       Shoulder Instructions      Home Living Family/patient expects to be discharged to:: Unsure                                 Additional Comments: lives at home with wife of 22 years.  Wife in transport w/c in room.  Pt assists her with her socks.        Prior Functioning/Environment Level of Independence: Independent             OT Diagnosis: Generalized weakness;Acute pain   OT Problem List: Decreased strength;Decreased activity tolerance;Impaired balance (sitting and/or standing);Decreased knowledge of use of DME or AE;Pain   OT Treatment/Interventions: Self-care/ADL training;Energy conservation;DME and/or AE instruction;Patient/family education;Therapeutic activities    OT Goals(Current goals can be found in the care plan section) Acute Rehab OT Goals Patient Stated Goal: to get stronger OT Goal Formulation: With patient Time For Goal Achievement: 09/02/14 Potential to Achieve Goals: Good ADL Goals Pt Will Perform Grooming: with supervision;standing Pt Will Perform Lower Body Bathing: with min  assist;sit to/from stand;with adaptive equipment Pt Will Perform Lower Body Dressing: with min assist;with adaptive equipment;sit to/from stand Pt Will Transfer to Toilet: with min assist;ambulating;bedside commode Additional ADL Goal #1: pt will perform bed mobility with min A from flat bed in preparation for ADLs  OT Frequency: Min 2X/week   Barriers to D/C:            Co-evaluation              End of Session Nurse Communication:  (pt up in chair:  ? need alarm pad)  Activity Tolerance: Patient tolerated treatment well Patient left: in chair;with call bell/phone within reach;with family/visitor present   Time: 7408-1448 OT Time Calculation (min): 21 min Charges:  OT General Charges $OT Visit: 1 Procedure OT Evaluation $Initial OT Evaluation Tier I: 1 Procedure G-Codes:    Tharon Bomar 09-09-2014, 4:07 PM  Lesle Chris, OTR/L [phone] 09-Sep-2014

## 2014-08-26 NOTE — Progress Notes (Signed)
Patient ID: Shane [name], male   DOB: January 14, 1927, 81 y.o.   MRN: [id number] 2 Days Post-Op  Subjective: Pt feels ok today.  Wants to eat.  Has voided since foley was removed this morning  Objective: Vital signs in last 24 hours: Temp:  [99 F (37.2 C)-100 F (37.8 C)] 99 F (37.2 C) (05/17 0749) Pulse Rate:  [73-78] 78 (05/17 0600) Resp:  [18-24] 18 (05/17 0600) BP: (90-146)/(40-62) 146/61 mmHg (05/17 0600) SpO2:  [91 %-99 %] 97 % (05/17 0600)    Intake/Output from previous day: 05/16 0701 - 05/17 0700 In: 3075 [I.V.:2875; IV Piggyback:200] Out: 1025 [Urine:725; Emesis/NG output:300] Intake/Output this shift:    PE: Abd: soft, +BS, no output in ostomy yet, but stoma is pink and viable.  Midline wound is clean and packed.  Appropriately tender Heart: regular Lungs: CTAB  Lab Results:   Recent Labs  08/25/14 0353 08/26/14 0345  WBC 7.9 19.8*  HGB 11.7* 11.2*  HCT 36.5* 33.3*  PLT 81* 77*   BMET  Recent Labs  08/25/14 0353 08/26/14 0345  NA 142 144  K 3.9 4.0  CL 110 114*  CO2 23 22  GLUCOSE 141* 153*  BUN 19 21*  CREATININE 1.71* 1.60*  CALCIUM 7.2* 6.9*   PT/INR No results for input(s): LABPROT, INR in the last 72 hours. CMP     Component Value Date/Time   NA 144 08/26/2014 0345   NA 144 06/24/2014 1220   NA 141 07/06/2011 0855   K 4.0 08/26/2014 0345   K 3.9 06/24/2014 1220   K 3.6 07/06/2011 0855   CL 114* 08/26/2014 0345   CL 105 09/17/2012 0801   CL 104 07/06/2011 0855   CO2 22 08/26/2014 0345   CO2 26 06/24/2014 1220   CO2 28 07/06/2011 0855   GLUCOSE 153* 08/26/2014 0345   GLUCOSE 138 06/24/2014 1220   GLUCOSE 207* 09/17/2012 0801   GLUCOSE 90 07/06/2011 0855   BUN 21* 08/26/2014 0345   BUN 17.4 06/24/2014 1220   BUN 18 07/06/2011 0855   CREATININE 1.60* 08/26/2014 0345   CREATININE 1.3 06/24/2014 1220   CREATININE 1.6* 07/06/2011 0855   CALCIUM 6.9* 08/26/2014 0345   CALCIUM 9.0 06/24/2014 1220   CALCIUM 8.8 07/06/2011 0855   PROT 6.8 08/24/2014 1213   PROT 6.4 06/24/2014 1220   PROT 7.5 07/06/2011 0855   ALBUMIN 3.5 08/24/2014 1213   ALBUMIN 3.2* 06/24/2014 1220   AST 24 08/24/2014 1213   AST 18 06/24/2014 1220   AST 22 07/06/2011 0855   ALT 13* 08/24/2014 1213   ALT 15 06/24/2014 1220   ALT 18 07/06/2011 0855   ALKPHOS 62 08/24/2014 1213   ALKPHOS 52 06/24/2014 1220   ALKPHOS 56 07/06/2011 0855   BILITOT 1.7* 08/24/2014 1213   BILITOT 0.74 06/24/2014 1220   BILITOT 0.80 07/06/2011 0855   GFRNONAA 38* 08/26/2014 0345   GFRAA 44* 08/26/2014 0345   Lipase     Component Value Date/Time   LIPASE 19* 08/24/2014 1213       Studies/Results: Ct Abdomen Pelvis W Contrast  08/24/2014   ADDENDUM REPORT: 08/24/2014 14:15  ADDENDUM: Critical Value/emergent results were called by telephone at the time of interpretation on 08/24/2014 at 2:10 pm to Dr. Pamella Pert , who verbally acknowledged these results.   Electronically Signed   By: Margarette Canada M.D.   On: 08/24/2014 14:15   08/24/2014   CLINICAL DATA:  81 year old male with bilateral lower abdominal and pelvic pain.  History of prostate cancer, appendectomy and inguinal hernia repair.  EXAM: CT ABDOMEN AND PELVIS WITH CONTRAST  TECHNIQUE: Multidetector CT imaging of the abdomen and pelvis was performed using the standard protocol following bolus administration of intravenous contrast.  CONTRAST:  47mL OMNIPAQUE IOHEXOL 300 MG/ML  SOLN  COMPARISON:  07/06/2011 CT  FINDINGS: Lower chest:  Mild bibasilar atelectasis identified.  Hepatobiliary: The liver and gallbladder are unremarkable. There is no evidence of biliary dilatation.  Pancreas: Unremarkable  Spleen: Unremarkable  Adrenals/Urinary Tract: Moderate renal cortical atrophy noted. A 12 mm nonobstructing calculus within the left lower kidney and right lower pole renal cyst noted. There is no evidence of hydronephrosis. The adrenal glands and bladder are unremarkable.  Stomach/Bowel: Pneumoperitoneum is  identified, with the largest focus of gas in the lower pelvis compatible with bowel perforation, which appears to originate from the distal sigmoid colon. Diffuse circumferential colonic and small bowel wall thickening is noted loops which may represent colitis/enteritis or reactive. Small amount of free fluid within the abdomen is noted. No discrete abscess is noted.  Vascular/Lymphatic: No enlarged lymph nodes or abdominal aortic aneurysm.  Reproductive: Prostatectomy changes and penile prosthesis noted.  Musculoskeletal: No acute or suspicious abnormalities. Multilevel degenerative disc disease and spondylosis within the lumbar spine identified.  IMPRESSION: Pneumoperitoneum compatible with bowel perforation. The site of perforation appears to be in the distal sigmoid colon. Small amount of ascites without abscess.  Diffuse colonic and small bowel wall thickening which may represent colitis/enteritis vs reactive.  Electronically Signed: By: Margarette Canada M.D. On: 08/24/2014 14:11   Dg Chest Port 1 View  08/24/2014   CLINICAL DATA:  Preop  EXAM: PORTABLE CHEST - 1 VIEW  COMPARISON:  04/01/2011  FINDINGS: Lungs are very under aerated with bibasilar atelectasis. Normal heart size. Left midlung nodular density has developed measuring 14 mm. No pneumothorax.  IMPRESSION: New left midlung nodular density. Upright PA and lateral chest radiographs are recommended.  Bibasilar atelectasis.   Electronically Signed   By: Marybelle Killings M.D.   On: 08/24/2014 15:10    Anti-infectives: Anti-infectives    Start     Dose/Rate Route Frequency Ordered Stop   08/24/14 2200  piperacillin-tazobactam (ZOSYN) IVPB 3.375 g     3.375 g 12.5 mL/hr over 240 Minutes Intravenous Every 8 hours 08/24/14 2038     08/24/14 1415  piperacillin-tazobactam (ZOSYN) IVPB 3.375 g     3.375 g 100 mL/hr over 30 Minutes Intravenous  Once 08/24/14 1413 08/24/14 1530       Assessment/Plan   POD 1, s/p ex lap with Hartman's procedure for  perforated sigmoid colon, mucosal mass, Dr. Zella Richer -clamp NGT.  Will d/w Dr. Excell Seltzer, but I think this could come out today and give him sips of liquids -BID dressing changes -WOC for routine ostomy care -foley DC today, patient has voided on his own -PT/OT eval -transfer to floor today DM -SSI HTN -prn hydralazine Metastatic prostate cancer DVT prophlyaxis/thrombocytopenia (77K) -SCDs/ heparin held due to low platelets.  Suspect this is secondary to infectious process.  Only had 2-3 doses of heparin.  Will follow labs  -transfer to floor  LOS: 2 days    Husain Costabile E 08/26/2014, 8:37 AM Pager: [phone]

## 2014-08-26 NOTE — Progress Notes (Signed)
Chaplain visited patient. Patient was grateful for the visit. Chaplain listened to patient and was a presence and a support to patient. Chaplain will follow patient.   08/26/14 1000  Clinical Encounter Type  Visited With Patient  Visit Type Initial;Spiritual support;Social support  Referral From Patient's clergy

## 2014-08-26 NOTE — Progress Notes (Signed)
Pt arrived to unit in a wheelchair escorted by tech. Pt in NAD. Pt transitioned to bed by pivoting with tech. Pt with IV infusing at 125 ml/hr. Pt has NG tube in place. Auscultation done to confirm placement. Pt then placed on low intermittent wall suction with secretions noted in tubing. Pt alert and oriented. Pt oriented to room and call bell. All questions answered. Pt resting quietly in bed at this time.

## 2014-08-27 LAB — GLUCOSE, CAPILLARY
GLUCOSE-CAPILLARY: 100 mg/dL — AB (ref 65–99)
GLUCOSE-CAPILLARY: 112 mg/dL — AB (ref 65–99)
GLUCOSE-CAPILLARY: 93 mg/dL (ref 65–99)
Glucose-Capillary: 121 mg/dL — ABNORMAL HIGH (ref 65–99)
Glucose-Capillary: 115 mg/dL — ABNORMAL HIGH (ref 65–99)
Glucose-Capillary: 111 mg/dL — ABNORMAL HIGH (ref 65–99)
Glucose-Capillary: 119 mg/dL — ABNORMAL HIGH (ref 65–99)

## 2014-08-27 LAB — BASIC METABOLIC PANEL
Anion gap: 7 (ref 5–15)
BUN: 18 mg/dL (ref 6–20)
CALCIUM: 7.4 mg/dL — AB (ref 8.9–10.3)
CHLORIDE: 119 mmol/L — AB (ref 101–111)
CO2: 21 mmol/L — AB (ref 22–32)
Creatinine, Ser: 1.27 mg/dL — ABNORMAL HIGH (ref 0.61–1.24)
GFR calc Af Amer: 58 mL/min — ABNORMAL LOW (ref >60)
GFR calc non Af Amer: 50 mL/min — ABNORMAL LOW (ref >60)
Glucose, Bld: 127 mg/dL — ABNORMAL HIGH (ref 65–99)
Potassium: 3.6 mmol/L (ref 3.5–5.1)
Sodium: 147 mmol/L — ABNORMAL HIGH (ref 135–145)

## 2014-08-27 LAB — URINE CULTURE

## 2014-08-27 LAB — CBC
HCT: 30.8 % — ABNORMAL LOW (ref 39.0–52.0)
HEMOGLOBIN: 10 g/dL — AB (ref 13.0–17.0)
MCH: 29 pg (ref 26.0–34.0)
MCHC: 32.5 g/dL (ref 30.0–36.0)
MCV: 89.3 fL (ref 78.0–100.0)
PLATELETS: 67 10*3/uL — AB (ref 150–400)
RBC: 3.45 MIL/uL — ABNORMAL LOW (ref 4.22–5.81)
RDW: 14.7 % (ref 11.5–15.5)
WBC: 22.7 10*3/uL — ABNORMAL HIGH (ref 4.0–10.5)

## 2014-08-27 NOTE — Consult Note (Signed)
WOC ostomy follow up Stoma type/location: LLQ, end colostomy Pt up in chair with visitors, his wife is not currently available.  She will meet with [place] tomorrow after 12.   Stomal assessment/size: 1 1/2 inches round, appear budded, pink, and moist Peristomal assessment: pouch not changed today Treatment options for stomal/peristomal skin: NA Output brown effluent  Ostomy pouching:2pc.in place, supplies 2 1/4 pouching system placed in the room Education provided:  Demonstrated pouch attachment to the wafer and lock and roll closure to the patient and allowed him to demonstrate same, educational materials reviewed. Pt is quite engaged in my education and reports he will return to home with his wife.  Retired Company secretary and very active lifestyle, we have reviewed life with stoma and limited restrictions with ostomy.  Lifestyle educational ostomy materials left with patient also.  Enrolled patient in [place] Start Discharge program: Yes  Will plan for [place] team to follow up with patient and his wife tomorrow for teaching session and would suggest [facility] for support once DC to home.  Malcome Ambrocio Minneola RN,CWOCN [phone]

## 2014-08-27 NOTE — Progress Notes (Signed)
Physical Therapy Treatment Patient Details Name: Shane [name] MRN: [id number] DOB: July 02, 1926 Today's Date: 12-Sep-2014    History of Present Illness 81 yo male admitted 08/24/14  with abdominal pain. Found with Perforated bowel with resection and colostomy formation.      PT Comments    Pt with improved mobility today and able to ambulate in hallway.  Will continue to recommend SNF however unless family feels able to provide 24/7 assist at home.  Follow Up Recommendations  SNF;Supervision/Assistance - 24 hour (unless family can provide assist at home)     Equipment Recommendations  None recommended by PT    Recommendations for Other Services       Precautions / Restrictions Precautions Precautions: Fall Precaution Comments: colostomy, incontinence    Mobility  Bed Mobility Overal bed mobility: Needs Assistance Bed Mobility: Supine to Sit     Supine to sit: Mod assist     General bed mobility comments: verbal cues for technique, assist for trunk  Transfers Overall transfer level: Needs assistance Equipment used: Rolling walker (2 wheeled) Transfers: Sit to/from Stand Sit to Stand: Min assist         General transfer comment: verbal cues for hand placement, posterior lean upon standing requiring assist to correct  Ambulation/Gait Ambulation/Gait assistance: Min guard Ambulation Distance (Feet): 200 Feet Assistive device: Rolling walker (2 wheeled) Gait Pattern/deviations: Step-through pattern;Trunk flexed;Decreased stride length     General Gait Details: verbal cues for posture and RW distance   Stairs            Wheelchair Mobility    Modified Rankin (Stroke Patients Only)       Balance                                    Cognition Arousal/Alertness: Awake/alert Behavior During Therapy: WFL for tasks assessed/performed Overall Cognitive Status: Within Functional Limits for tasks assessed                       Exercises      General Comments        Pertinent Vitals/Pain Pain Assessment: Faces Faces Pain Scale: Hurts little more Pain Location: abdomen with bed mobility Pain Descriptors / Indicators: Sore Pain Intervention(s): Limited activity within patient's tolerance;Monitored during session;Repositioned    Home Living                      Prior Function            PT Goals (current goals can now be found in the care plan section) Progress towards PT goals: Progressing toward goals    Frequency  Min 3X/week    PT Plan Current plan remains appropriate    Co-evaluation             End of Session   Activity Tolerance: No increased pain;Patient tolerated treatment well Patient left: in chair;with call bell/phone within reach;with chair alarm set     Time: 0102-7253 PT Time Calculation (min) (ACUTE ONLY): 14 min  Charges:  $Gait Training: 8-22 mins                    G Codes:      Jarquis Walker,KATHrine E 2014/09/12, 1:50 PM Carmelia Bake, PT, DPT 09/12/2014 Pager: [phone]

## 2014-08-27 NOTE — Progress Notes (Signed)
Chaplain visited patient and family. Patient is sitting up, walking around and feeling better. Chaplain provided a presence and emotional support to patient and family.   08/27/14 1400  Clinical Encounter Type  Visited With Patient and family together  Visit Type Follow-up;Spiritual support;Social support  Referral From Patient's clergy

## 2014-08-27 NOTE — Progress Notes (Signed)
Patient ID: Shane [name], male   DOB: Sep 23, 1926, 81 y.o.   MRN: [id number]     [address] [place], [place] [address]    Phone: [phone] FAX: [phone]     Subjective: No n/v.  NGT clamped. VSS.  Afebrile. Voiding.  Ambulating. WBC up, but afebrile.    Objective:  Vital signs:  Filed Vitals:   08/26/14 0945 08/26/14 1428 08/26/14 2200 08/27/14 0553  BP: 138/61 163/65 157/65 166/65  Pulse: 76 73 72 75  Temp:  99.5 F (37.5 C) 98.9 F (37.2 C) 99.3 F (37.4 C)  TempSrc:  Oral Oral Oral  Resp: 23 20 18 16   Height:      Weight:      SpO2: 96% 97% 99% 99%       Intake/Output   Yesterday:  05/17 0701 - 05/18 0700 In: 3000 [I.V.:3000] Out: 1125 [Urine:975; Emesis/NG output:150] This shift:  Total I/O In: -  Out: 150 [Urine:150]  Physical Exam: General: Pt awake/alert/oriented x4 in no acute distress Chest: cta.  No chest wall pain w good excursion CV:  Pulses intact.  Regular rhythm MS: Normal AROM mjr joints.  No obvious deformity Abdomen: Soft.  Nondistended.  Midline wound in clean, repacked.  Colostomy is pink and viable, no function yet.   No evidence of peritonitis.  No incarcerated hernias. Ext:  SCDs BLE.  No mjr edema.  No cyanosis Skin: No petechiae / purpura   Problem List:   Principal Problem:   Perforation of sigmoid colon s/p hartmann procedure 08/24/14 Active Problems:   Colon perforation    Results:   Labs: Results for orders placed or performed during the hospital encounter of 08/24/14 (from the past 48 hour(s))  Glucose, capillary     Status: Abnormal   Collection Time: 08/25/14 12:00 PM  Result Value Ref Range   Glucose-Capillary 136 (H) 65 - 99 mg/dL   Comment 1 Notify RN    Comment 2 Document in Chart   Glucose, capillary     Status: Abnormal   Collection Time: 08/25/14  3:47 PM  Result Value Ref Range   Glucose-Capillary 148 (H) 65 - 99 mg/dL   Comment 1  Notify RN    Comment 2 Document in Chart   Glucose, capillary     Status: Abnormal   Collection Time: 08/25/14  7:52 PM  Result Value Ref Range   Glucose-Capillary 148 (H) 65 - 99 mg/dL   Comment 1 Notify RN    Comment 2 Document in Chart   Glucose, capillary     Status: Abnormal   Collection Time: 08/25/14 11:54 PM  Result Value Ref Range   Glucose-Capillary 123 (H) 65 - 99 mg/dL  Glucose, capillary     Status: Abnormal   Collection Time: 08/26/14  3:24 AM  Result Value Ref Range   Glucose-Capillary 111 (H) 65 - 99 mg/dL  CBC     Status: Abnormal   Collection Time: 08/26/14  3:45 AM  Result Value Ref Range   WBC 19.8 (H) 4.0 - 10.5 K/uL   RBC 3.72 (L) 4.22 - 5.81 MIL/uL   Hemoglobin 11.2 (L) 13.0 - 17.0 g/dL   HCT 33.3 (L) 39.0 - 52.0 %   MCV 89.5 78.0 - 100.0 fL   MCH 30.1 26.0 - 34.0 pg   MCHC 33.6 30.0 - 36.0 g/dL   RDW 14.7 11.5 - 15.5 %   Platelets 77 (  L) 150 - 400 K/uL    Comment: REPEATED TO VERIFY SPECIMEN CHECKED FOR CLOTS PLATELET COUNT CONFIRMED BY SMEAR LARGE PLATELETS PRESENT   Basic metabolic panel     Status: Abnormal   Collection Time: 08/26/14  3:45 AM  Result Value Ref Range   Sodium 144 135 - 145 mmol/L   Potassium 4.0 3.5 - 5.1 mmol/L   Chloride 114 (H) 101 - 111 mmol/L   CO2 22 22 - 32 mmol/L   Glucose, Bld 153 (H) 65 - 99 mg/dL   BUN 21 (H) 6 - 20 mg/dL   Creatinine, Ser 1.60 (H) 0.61 - 1.24 mg/dL   Calcium 6.9 (L) 8.9 - 10.3 mg/dL   GFR calc non Af Amer 38 (L) >60 mL/min   GFR calc Af Amer 44 (L) >60 mL/min    Comment: (NOTE) The eGFR has been calculated using the CKD EPI equation. This calculation has not been validated in all clinical situations. eGFR's persistently <60 mL/min signify possible Chronic Kidney Disease.    Anion gap 8 5 - 15  Glucose, capillary     Status: Abnormal   Collection Time: 08/26/14  4:33 AM  Result Value Ref Range   Glucose-Capillary 125 (H) 65 - 99 mg/dL  Glucose, capillary     Status: Abnormal   Collection  Time: 08/26/14  7:17 AM  Result Value Ref Range   Glucose-Capillary 130 (H) 65 - 99 mg/dL   Comment 1 Notify RN    Comment 2 Document in Chart   Glucose, capillary     Status: Abnormal   Collection Time: 08/26/14 11:57 AM  Result Value Ref Range   Glucose-Capillary 112 (H) 65 - 99 mg/dL  Glucose, capillary     Status: Abnormal   Collection Time: 08/26/14  4:02 PM  Result Value Ref Range   Glucose-Capillary 123 (H) 65 - 99 mg/dL  Glucose, capillary     Status: Abnormal   Collection Time: 08/26/14  8:01 PM  Result Value Ref Range   Glucose-Capillary 119 (H) 65 - 99 mg/dL  Glucose, capillary     Status: Abnormal   Collection Time: 08/27/14 12:40 AM  Result Value Ref Range   Glucose-Capillary 100 (H) 65 - 99 mg/dL  Glucose, capillary     Status: Abnormal   Collection Time: 08/27/14  4:28 AM  Result Value Ref Range   Glucose-Capillary 121 (H) 65 - 99 mg/dL  CBC     Status: Abnormal   Collection Time: 08/27/14  4:50 AM  Result Value Ref Range   WBC 22.7 (H) 4.0 - 10.5 K/uL   RBC 3.45 (L) 4.22 - 5.81 MIL/uL   Hemoglobin 10.0 (L) 13.0 - 17.0 g/dL   HCT 30.8 (L) 39.0 - 52.0 %   MCV 89.3 78.0 - 100.0 fL   MCH 29.0 26.0 - 34.0 pg   MCHC 32.5 30.0 - 36.0 g/dL   RDW 14.7 11.5 - 15.5 %   Platelets 67 (L) 150 - 400 K/uL    Comment: SPECIMEN CHECKED FOR CLOTS PLATELET COUNT CONFIRMED BY SMEAR   Basic metabolic panel     Status: Abnormal   Collection Time: 08/27/14  4:50 AM  Result Value Ref Range   Sodium 147 (H) 135 - 145 mmol/L   Potassium 3.6 3.5 - 5.1 mmol/L   Chloride 119 (H) 101 - 111 mmol/L   CO2 21 (L) 22 - 32 mmol/L   Glucose, Bld 127 (H) 65 - 99 mg/dL   BUN 18 6 -  20 mg/dL   Creatinine, Ser 1.27 (H) 0.61 - 1.24 mg/dL   Calcium 7.4 (L) 8.9 - 10.3 mg/dL   GFR calc non Af Amer 50 (L) >60 mL/min   GFR calc Af Amer 58 (L) >60 mL/min    Comment: (NOTE) The eGFR has been calculated using the CKD EPI equation. This calculation has not been validated in all clinical  situations. eGFR's persistently <60 mL/min signify possible Chronic Kidney Disease.    Anion gap 7 5 - 15  Glucose, capillary     Status: Abnormal   Collection Time: 08/27/14  7:47 AM  Result Value Ref Range   Glucose-Capillary 115 (H) 65 - 99 mg/dL    Imaging / Studies: No results found.  Medications / Allergies:  Scheduled Meds: . insulin aspart  0-9 Units Subcutaneous 6 times per day  . methylPREDNISolone (SOLU-MEDROL) injection  5.2 mg Intravenous Daily  . pantoprazole (PROTONIX) IV  40 mg Intravenous Q24H  . piperacillin-tazobactam (ZOSYN)  IV  3.375 g Intravenous Q8H   Continuous Infusions: . dextrose 5 % and 0.9 % NaCl with KCl 20 mEq/L 125 mL/hr at 08/27/14 0806   PRN Meds:.hydrALAZINE, morphine injection, ondansetron **OR** ondansetron (ZOFRAN) IV  Antibiotics: Anti-infectives    Start     Dose/Rate Route Frequency Ordered Stop   08/24/14 2200  piperacillin-tazobactam (ZOSYN) IVPB 3.375 g     3.375 g 12.5 mL/hr over 240 Minutes Intravenous Every 8 hours 08/24/14 2038     08/24/14 1415  piperacillin-tazobactam (ZOSYN) IVPB 3.375 g     3.375 g 100 mL/hr over 30 Minutes Intravenous  Once 08/24/14 1413 08/24/14 1530        Assessment/Plan  POD 3, s/p ex lap with Hartman's procedure for perforated sigmoid colon, mucosal mass, Dr. Zella Richer -DC NGT and keep NPO today  -BID dressing changes -WOC for routine ostomy care -PT/OT eval -WBC up, concerning, repeat in AM.  May need CT scan if it remains up. DM -SSI HTN -prn hydralazine Metastatic prostate cancer DVT prophlyaxis/thrombocytopenia (77K) -SCDs/ heparin held due to low platelets. Suspect this is secondary to infectious process. Only had 2-3 doses of heparin. Will follow labs  Erby Pian, [facility] Pager [phone](7A-4:30P)   08/27/2014 8:59 AM

## 2014-08-28 ENCOUNTER — Ambulatory Visit: Payer: Medicare Other | Admitting: Oncology

## 2014-08-28 ENCOUNTER — Other Ambulatory Visit: Payer: Medicare Other

## 2014-08-28 LAB — CBC
HEMATOCRIT: 30 % — AB (ref 39.0–52.0)
Hemoglobin: 9.7 g/dL — ABNORMAL LOW (ref 13.0–17.0)
MCH: 29 pg (ref 26.0–34.0)
MCHC: 32.3 g/dL (ref 30.0–36.0)
MCV: 89.6 fL (ref 78.0–100.0)
Platelets: 57 10*3/uL — ABNORMAL LOW (ref 150–400)
RBC: 3.35 MIL/uL — ABNORMAL LOW (ref 4.22–5.81)
RDW: 14.6 % (ref 11.5–15.5)
WBC: 15.6 10*3/uL — AB (ref 4.0–10.5)

## 2014-08-28 LAB — GLUCOSE, CAPILLARY
GLUCOSE-CAPILLARY: 100 mg/dL — AB (ref 65–99)
GLUCOSE-CAPILLARY: 119 mg/dL — AB (ref 65–99)
GLUCOSE-CAPILLARY: 64 mg/dL — AB (ref 65–99)
Glucose-Capillary: 116 mg/dL — ABNORMAL HIGH (ref 65–99)
Glucose-Capillary: 104 mg/dL — ABNORMAL HIGH (ref 65–99)
Glucose-Capillary: 157 mg/dL — ABNORMAL HIGH (ref 65–99)
Glucose-Capillary: 69 mg/dL (ref 65–99)
Glucose-Capillary: 73 mg/dL (ref 65–99)

## 2014-08-28 LAB — SAVE SMEAR

## 2014-08-28 MED ORDER — CETYLPYRIDINIUM CHLORIDE 0.05 % MT LIQD
7.0000 mL | Freq: Two times a day (BID) | OROMUCOSAL | Status: DC
  Administered 2014-08-28 – 2014-08-31 (×6): 7 mL via OROMUCOSAL

## 2014-08-28 NOTE — Progress Notes (Signed)
Patient ID: Shane [name], male   DOB: 08-08-1926, 81 y.o.   MRN: [id number] 4 Days Post-Op  Subjective: Pt feels great today.  Not having much pain.  Hungry and wants to eat.  Up in chair  Objective: Vital signs in last 24 hours: Temp:  [97.8 F (36.6 C)-98.8 F (37.1 C)] 98.8 F (37.1 C) (05/19 0504) Pulse Rate:  [59-95] 59 (05/19 0504) Resp:  [14-16] 16 (05/19 0504) BP: (136-174)/(54-90) 143/65 mmHg (05/19 0504) SpO2:  [93 %-99 %] 95 % (05/19 0504) Last BM Date:  (prior to admission)  Intake/Output from previous day: 05/18 0701 - 05/19 0700 In: 2150 [I.V.:2000; IV Piggyback:150] Out: 1125 [Urine:1125] Intake/Output this shift: Total I/O In: 1500 [I.V.:1500] Out: 400 [Urine:400]  PE: Abd: soft, appropriately tender, wound is clean and packed, ostomy with pink viable stoma, and minimal air in bag, +BS HearT: regular Lungs: CTAB  Lab Results:   Recent Labs  08/27/14 0450 08/28/14 0415  WBC 22.7* 15.6*  HGB 10.0* 9.7*  HCT 30.8* 30.0*  PLT 67* 57*   BMET  Recent Labs  08/26/14 0345 08/27/14 0450  NA 144 147*  K 4.0 3.6  CL 114* 119*  CO2 22 21*  GLUCOSE 153* 127*  BUN 21* 18  CREATININE 1.60* 1.27*  CALCIUM 6.9* 7.4*   PT/INR No results for input(s): LABPROT, INR in the last 72 hours. CMP     Component Value Date/Time   NA 147* 08/27/2014 0450   NA 144 06/24/2014 1220   NA 141 07/06/2011 0855   K 3.6 08/27/2014 0450   K 3.9 06/24/2014 1220   K 3.6 07/06/2011 0855   CL 119* 08/27/2014 0450   CL 105 09/17/2012 0801   CL 104 07/06/2011 0855   CO2 21* 08/27/2014 0450   CO2 26 06/24/2014 1220   CO2 28 07/06/2011 0855   GLUCOSE 127* 08/27/2014 0450   GLUCOSE 138 06/24/2014 1220   GLUCOSE 207* 09/17/2012 0801   GLUCOSE 90 07/06/2011 0855   BUN 18 08/27/2014 0450   BUN 17.4 06/24/2014 1220   BUN 18 07/06/2011 0855   CREATININE 1.27* 08/27/2014 0450   CREATININE 1.3 06/24/2014 1220   CREATININE 1.6* 07/06/2011 0855   CALCIUM 7.4* 08/27/2014  0450   CALCIUM 9.0 06/24/2014 1220   CALCIUM 8.8 07/06/2011 0855   PROT 6.8 08/24/2014 1213   PROT 6.4 06/24/2014 1220   PROT 7.5 07/06/2011 0855   ALBUMIN 3.5 08/24/2014 1213   ALBUMIN 3.2* 06/24/2014 1220   AST 24 08/24/2014 1213   AST 18 06/24/2014 1220   AST 22 07/06/2011 0855   ALT 13* 08/24/2014 1213   ALT 15 06/24/2014 1220   ALT 18 07/06/2011 0855   ALKPHOS 62 08/24/2014 1213   ALKPHOS 52 06/24/2014 1220   ALKPHOS 56 07/06/2011 0855   BILITOT 1.7* 08/24/2014 1213   BILITOT 0.74 06/24/2014 1220   BILITOT 0.80 07/06/2011 0855   GFRNONAA 50* 08/27/2014 0450   GFRAA 58* 08/27/2014 0450   Lipase     Component Value Date/Time   LIPASE 19* 08/24/2014 1213       Studies/Results: No results found.  Anti-infectives: Anti-infectives    Start     Dose/Rate Route Frequency Ordered Stop   08/24/14 2200  piperacillin-tazobactam (ZOSYN) IVPB 3.375 g     3.375 g 12.5 mL/hr over 240 Minutes Intravenous Every 8 hours 08/24/14 2038     08/24/14 1415  piperacillin-tazobactam (ZOSYN) IVPB 3.375 g     3.375 g 100 mL/hr over  30 Minutes Intravenous  Once 08/24/14 1413 08/24/14 1530       Assessment/Plan POD 3, s/p ex lap with Hartman's procedure for perforated sigmoid colon, mucosal mass, Dr. Zella Richer -clear liquids -BID dressing changes -WOC for routine ostomy care -PT/OT eval -WBC still up, but trending down to 15K today [name] for routine ostomy care, wound care, PT, OT.  Patient refused SNF DM -SSI HTN -prn hydralazine Metastatic prostate cancer DVT prophlyaxis/thrombocytopenia (57K) -SCDs/ heparin held due to low platelets.Spoke to hematology this morning.  Will get a peripheral smear today.  If platelets continue to drop, we will consult them tomorrow  LOS: 4 days    Shane [name] E 08/28/2014, 12:16 PM Pager: [phone]

## 2014-08-28 NOTE — Consult Note (Signed)
WOC ostomy consult note Stoma type/location: LLQ end colostomy Stomal assessment/size: 1 3/4" round and well budded.  Peristomal assessment: Intact.  Skin folds present at 1:00.  Barrier ring will be implemented for secure fit and improved wear time.  Treatment options for stomal/peristomal skin: Barrier ring.  Output Liquid brown stool Ostomy pouching: 2pc. 2 1/4" pouch with barrier ring  Will order additional supplies.  Education provided: Wife at bedside.  Old pouch removed with push/pull technique. Discussed peristomal cleansing, pouch change frequency and pouch emptying.  Patient and wife demonstrated roll closure.  Stoma is measured and barrier cut to fit.  Discussed showering and activities after discharge.  Patient and wife acknowledge that they are learning, but not quite comfortable with independent care.  Will benefit from [facility] after discharge for ongoing teaching.  Enrolled patient in [place] program: Yes [place] team will continue to follow for ongoing education.  Domenic Moras RN BSN [place] Pager [phone]

## 2014-08-28 NOTE — Care Management Note (Signed)
Case Management Note  Patient Details  Name: Shane [name] MRN: [id number] Date of Birth: 1926/09/18  Subjective/Objective:          POD 3, s/p ex lap with Hartman's procedure for perforated sigmoid colon, mucosal mass, Dr. Zella Richer  Action/Plan: Discharge planning. Spoke with patient at bedside x 2. Patient is insistent that he will be able to d/c to home. Patient lives with his wife who is not in good health, presents for visits in a w/c. Patient states she has "bad arthritis and gout", she is not able to drive. Discussed with patient he will need assistance at d/c, need transportation assistance, assistance with managing meals, etc. He states his wife can do some and he has a son who he can call. I suggested he contact is son ahead of time to arrange transportation. Patient understood and agrees that he will need to enlist family and friends for help. Provide patient list for [facility] agency choice. Patient to discuss with wife and I will f/u.   Expected Discharge Date:                  Expected Discharge Plan:  [place]  In-House Referral:  NA  Discharge planning Services  CM Consult  Post Acute Care Choice:  Home Health Choice offered to:  NA, Patient  DME Arranged:    DME Agency:     HH Arranged:  RN, Disease Management, PT, OT HH Agency:     Status of Service:  In process, will continue to follow  Medicare Important Message Given:  Yes Date Medicare IM Given:  08/28/14 Medicare IM give by:  Sunday Spillers RN CM  Date Additional Medicare IM Given:    Additional Medicare Important Message give by:     If discussed at [place] of Stay Meetings, dates discussed:    Additional Comments:  Guadalupe Maple, RN 08/28/2014, 10:44 AM

## 2014-08-29 LAB — CBC
HEMATOCRIT: 31.7 % — AB (ref 39.0–52.0)
HEMOGLOBIN: 10.5 g/dL — AB (ref 13.0–17.0)
MCH: 29.5 pg (ref 26.0–34.0)
MCHC: 33.1 g/dL (ref 30.0–36.0)
MCV: 89 fL (ref 78.0–100.0)
Platelets: 55 10*3/uL — ABNORMAL LOW (ref 150–400)
RBC: 3.56 MIL/uL — AB (ref 4.22–5.81)
RDW: 14.7 % (ref 11.5–15.5)
WBC: 7.2 10*3/uL (ref 4.0–10.5)

## 2014-08-29 LAB — BASIC METABOLIC PANEL
Anion gap: 7 (ref 5–15)
BUN: 23 mg/dL — AB (ref 6–20)
CO2: 21 mmol/L — ABNORMAL LOW (ref 22–32)
Calcium: 8.2 mg/dL — ABNORMAL LOW (ref 8.9–10.3)
Chloride: 121 mmol/L — ABNORMAL HIGH (ref 101–111)
Creatinine, Ser: 1.18 mg/dL (ref 0.61–1.24)
GFR calc Af Amer: >60 mL/min (ref >60)
GFR, EST NON AFRICAN AMERICAN: 54 mL/min — AB (ref >60)
GLUCOSE: 118 mg/dL — AB (ref 65–99)
Potassium: 3.5 mmol/L (ref 3.5–5.1)
Sodium: 149 mmol/L — ABNORMAL HIGH (ref 135–145)

## 2014-08-29 LAB — GLUCOSE, CAPILLARY
GLUCOSE-CAPILLARY: 106 mg/dL — AB (ref 65–99)
GLUCOSE-CAPILLARY: 100 mg/dL — AB (ref 65–99)
Glucose-Capillary: 108 mg/dL — ABNORMAL HIGH (ref 65–99)
Glucose-Capillary: 117 mg/dL — ABNORMAL HIGH (ref 65–99)
Glucose-Capillary: 106 mg/dL — ABNORMAL HIGH (ref 65–99)

## 2014-08-29 MED ORDER — LOSARTAN POTASSIUM-HCTZ 100-25 MG PO TABS: 0.5000 | ORAL_TABLET | Freq: Every day | ORAL | Status: DC

## 2014-08-29 MED ORDER — LOSARTAN POTASSIUM 50 MG PO TABS
50.0000 mg | ORAL_TABLET | Freq: Every day | ORAL | Status: DC
  Administered 2014-08-29 – 2014-08-31 (×3): 50 mg via ORAL
  Filled 2014-08-29 (×3): qty 1

## 2014-08-29 MED ORDER — PREDNISONE 5 MG PO TABS
5.0000 mg | ORAL_TABLET | Freq: Every day | ORAL | Status: DC
  Administered 2014-08-29 – 2014-08-31 (×3): 5 mg via ORAL
  Filled 2014-08-29 (×3): qty 1

## 2014-08-29 MED ORDER — SODIUM CHLORIDE 0.9 % IJ SOLN
3.0000 mL | Freq: Two times a day (BID) | INTRAMUSCULAR | Status: DC
  Administered 2014-08-29 (×2): 3 mL via INTRAVENOUS

## 2014-08-29 MED ORDER — ABIRATERONE ACETATE 250 MG PO TABS
1000.0000 mg | ORAL_TABLET | Freq: Every day | ORAL | Status: DC
  Administered 2014-08-29 – 2014-08-31 (×3): 1000 mg via ORAL
  Filled 2014-08-29: qty 4

## 2014-08-29 MED ORDER — HYDROCHLOROTHIAZIDE 12.5 MG PO CAPS
12.5000 mg | ORAL_CAPSULE | Freq: Every day | ORAL | Status: DC
  Administered 2014-08-29 – 2014-08-31 (×3): 12.5 mg via ORAL
  Filled 2014-08-29 (×3): qty 1

## 2014-08-29 NOTE — Progress Notes (Signed)
Pt emptied ostomy pouch into measuring  container w/ supervision from RN.  Will continue to reinforce emptying of pouch.  Pt voiced understanding and performed procedure well.

## 2014-08-29 NOTE — Progress Notes (Signed)
Patient ID: Shane [name], male   DOB: [birth year], 81 y.o.   MRN: [id number] 5 Days Post-Op  Subjective: Pt feels well today.  No complaints.  Up in chair.  Tolerating clear liquids  Objective: Vital signs in last 24 hours: Temp:  [97.8 F (36.6 C)-99.2 F (37.3 C)] 99.2 F (37.3 C) (05/20 0511) Pulse Rate:  [48-94] 94 (05/20 0511) Resp:  [16] 16 (05/20 0511) BP: (158-176)/(56-67) 158/67 mmHg (05/20 0511) SpO2:  [93 %-100 %] 93 % (05/20 0511) Last BM Date:  (prior to admission)  Intake/Output from previous day: 05/19 0701 - 05/20 0700 In: 3897.9 [I.V.:3897.9] Out: 1450 [Urine:1400; Stool:50] Intake/Output this shift: Total I/O In: -  Out: 125 [Urine:125]  PE: Abd: soft, minimally tender, wound is clean and packed, ostomy with liquid stool and air in bag.  Stoma pink and viable  Lab Results:   Recent Labs  08/28/14 0415 08/29/14 0816  WBC 15.6* 7.2  HGB 9.7* 10.5*  HCT 30.0* 31.7*  PLT 57* 55*   BMET  Recent Labs  08/27/14 0450 08/29/14 0816  NA 147* 149*  K 3.6 3.5  CL 119* 121*  CO2 21* 21*  GLUCOSE 127* 118*  BUN 18 23*  CREATININE 1.27* 1.18  CALCIUM 7.4* 8.2*   PT/INR No results for input(s): LABPROT, INR in the last 72 hours. CMP     Component Value Date/Time   NA 149* 08/29/2014 0816   NA 144 06/24/2014 1220   NA 141 07/06/2011 0855   K 3.5 08/29/2014 0816   K 3.9 06/24/2014 1220   K 3.6 07/06/2011 0855   CL 121* 08/29/2014 0816   CL 105 09/17/2012 0801   CL 104 07/06/2011 0855   CO2 21* 08/29/2014 0816   CO2 26 06/24/2014 1220   CO2 28 07/06/2011 0855   GLUCOSE 118* 08/29/2014 0816   GLUCOSE 138 06/24/2014 1220   GLUCOSE 207* 09/17/2012 0801   GLUCOSE 90 07/06/2011 0855   BUN 23* 08/29/2014 0816   BUN 17.4 06/24/2014 1220   BUN 18 07/06/2011 0855   CREATININE 1.18 08/29/2014 0816   CREATININE 1.3 06/24/2014 1220   CREATININE 1.6* 07/06/2011 0855   CALCIUM 8.2* 08/29/2014 0816   CALCIUM 9.0 06/24/2014 1220   CALCIUM 8.8  07/06/2011 0855   PROT 6.8 08/24/2014 1213   PROT 6.4 06/24/2014 1220   PROT 7.5 07/06/2011 0855   ALBUMIN 3.5 08/24/2014 1213   ALBUMIN 3.2* 06/24/2014 1220   AST 24 08/24/2014 1213   AST 18 06/24/2014 1220   AST 22 07/06/2011 0855   ALT 13* 08/24/2014 1213   ALT 15 06/24/2014 1220   ALT 18 07/06/2011 0855   ALKPHOS 62 08/24/2014 1213   ALKPHOS 52 06/24/2014 1220   ALKPHOS 56 07/06/2011 0855   BILITOT 1.7* 08/24/2014 1213   BILITOT 0.74 06/24/2014 1220   BILITOT 0.80 07/06/2011 0855   GFRNONAA 54* 08/29/2014 0816   GFRAA >60 08/29/2014 0816   Lipase     Component Value Date/Time   LIPASE 19* 08/24/2014 1213       Studies/Results: No results found.  Anti-infectives: Anti-infectives    Start     Dose/Rate Route Frequency Ordered Stop   08/24/14 2200  piperacillin-tazobactam (ZOSYN) IVPB 3.375 g     3.375 g 12.5 mL/hr over 240 Minutes Intravenous Every 8 hours 08/24/14 2038     08/24/14 1415  piperacillin-tazobactam (ZOSYN) IVPB 3.375 g     3.375 g 100 mL/hr over 30 Minutes Intravenous  Once 08/24/14 1413  08/24/14 1530       Assessment/Plan   POD 5, s/p ex lap with Hartman's procedure for perforated sigmoid colon, mucosal mass, Dr. Zella Richer -adv to full liquids -BID dressing changes -WOC for routine ostomy care -[facility] set up for wound care, ostomy care, PT/OT, Patient refused SNF -WBC normalized today at 7.2.  Zosyn D5/7 DM -SSI, cbg well controlled on SSI.  When tolerating solid diet, can switch to oral home agents HTN -resume home BP meds Metastatic prostate cancer DVT prophlyaxis/thrombocytopenia (57K) -SCDs/ heparin held due to low platelets.Spoke to hematology yesterday. Will get a peripheral smear today. Platelets seem to have stabilized at 55K today.  Will follow.  Hold off on heme consult for now.  LOS: 5 days    [name] 08/29/2014, 9:43 AM Pager: [phone]

## 2014-08-29 NOTE — Progress Notes (Signed)
Dsg to midline abd done by RN with son and wife observing.  Pt's son stated he will be back tomorrow afternoon to do it himself with an RN to watch.  Pt's family needs reinforcement.

## 2014-08-29 NOTE — Discharge Summary (Signed)
Patient ID: Shane [name] MRN: [id number] DOB/AGE: 10-13-1926 81 y.o.  Admit date: 08/24/2014 Discharge date: 08/30/2014  Procedures: Emergency exploratory laparotomy, sigmoid colectomy, colostomy. Dr. Zella Richer 08-24-14  Consults: None  Reason for Admission: this is an 81 year old man who awoke this morning with lower abdominal pain that progressively worsened. He noted for the past week that he was having problems with constipation and smaller caliber stools. No fever or chills. No previous illnesses over the past month. He presented to the emergency department. CT scan demonstrated pneumoperitoneum and the pelvic area as well as some above the liver were some free fluid. There were inflammatory changes to the sigmoid colon and small bowel in the region of the pelvis. There also appeared to be some diffuse colonic thickening. He's had no diarrhea prior to this. He does have metastatic prostate cancer and is under the care of Dr. Osker Mason. Approximate 4 weeks ago,he had a right inguinal hernia repair with mesh done by Dr. Dalbert Batman and has been doing well from that.   Admission Diagnoses:  1. Perforated bowel 2. Metastatic prostate cancer 3. HTN 4. Hyperthyroidism 5.DM  Hospital Course: The patient was admitted and started on IV zosyn.  He was taken to the OR where he underwent the above procedure.  The patient tolerated this really well.  He was sent to the SDU unit postoperatively.  His WBC elevated on POD 1, 2, and 3, but began to fall and actually normalized on POD 5.  He had an NGT initially and this was clamped on POD 2, removed on POD 3, and given clears on POD 4.  He had flatus and stool in his bag on POD 5. His diet was able to be advanced as tolerated.  His midline abdominal wound was left open and was packed BID with kerlix.  This remained clean.  He refused SNF and so HH was set up for wound care, ostomy care, PT/OT.  He was treated with 7 days total of abx  therapy.  Thrombocytopenia: The patient came in with a platelet count of 81K. One month ago they were 78 so this is around his baseline.  They fell for about 4 days and stabilized on POD 5 at 55K.  This was discussed with hematology.  Since they stabilized and did not continue to drop, this was felt to likely be related to his infectious process.  He only received 2 doses of heparin and this was discontinued due to his low platelet count.  HIT panel was not ordered given his platelets were low to start with.  Nothing further was done for this except followed.  All other medical problems remained stable while admitted.  Discharge Diagnoses:  Principal Problem:   Perforation of sigmoid colon s/p hartmann procedure 08/24/14 Active Problems:   Colon perforation metastatic prostate cancer HTN DM  Discharge Medications:   Medication List    TAKE these medications        aspirin EC 81 MG tablet  Take 81 mg by mouth at bedtime.     atorvastatin 20 MG tablet  Commonly known as:  LIPITOR  Take 20 mg by mouth at bedtime.     [employer] SENNA PLUS 8.6-50 MG per tablet  Generic drug:  senna-docusate  TAKE 2 TABLETS BY MOUTH 2 (TWO) TIMES DAILY.     erythromycin ophthalmic ointment  Place into the left eye 4 (four) times daily. Apply 1/2 inch to left eye 4 times a day for 5 days.     gemfibrozil  600 MG tablet  Commonly known as:  LOPID  Take 600 mg by mouth 2 (two) times daily before a meal.     glipiZIDE 2.5 MG 24 hr tablet  Commonly known as:  GLUCOTROL XL  Take 2.5 mg by mouth daily.     HYDROcodone-acetaminophen 5-325 MG per tablet  Commonly known as:  NORCO  Take 1-2 tablets by mouth every 6 (six) hours as needed for moderate pain or severe pain.     HYZAAR 100-25 MG per tablet  Generic drug:  losartan-hydrochlorothiazide  Take 0.5 tablets by mouth daily.     Lactulose 20 GM/30ML Soln  Take 30 mLs (20 g total) by mouth 2 (two) times daily as needed (For constipation).      metoCLOPramide 5 MG tablet  Commonly known as:  REGLAN  Take 5 mg by mouth 4 (four) times daily -  before meals and at bedtime.     omeprazole 20 MG capsule  Commonly known as:  PRILOSEC  Take 20 mg by mouth daily before breakfast.     OVER THE COUNTER MEDICATION  Place 1 drop into the left eye at bedtime as needed (irritation). Over the counter eye drops     predniSONE 5 MG tablet  Commonly known as:  DELTASONE  TAKE 1 TABLET BY MOUTH DAILY     risedronate 35 MG tablet  Commonly known as:  ACTONEL  Take 35 mg by mouth every 7 (seven) days. On Mondays     sulindac 200 MG tablet  Commonly known as:  CLINORIL  Take 200 mg by mouth daily.     vitamin C 500 MG tablet  Commonly known as:  ASCORBIC ACID  Take 500 mg by mouth daily.     VITAMIN D PO  Take 1 tablet by mouth daily.     ZYTIGA 250 MG tablet  Generic drug:  abiraterone Acetate  TAKE 4 TABLETS BY MOUTH DAILY ON AN EMPTY STOMACH 1 HOUR BEFORE OR 2 HOURS AFTER A MEAL        Discharge Instructions: Follow-up Information    Follow up with Odis Hollingshead, MD On 09/17/2014.   Specialty:  General Surgery   Why:  9:10am, arrive by 8:40am for paperwork   Contact information:   [address] [place] [address] [phone]       Follow up with [facility].   Why:  nurse, physical therapy, occupational therapy   Contact information:   [address] [place] [address] [phone]       Signed: Stark Klein 08/30/2014, 8:39 AM

## 2014-08-29 NOTE — Progress Notes (Signed)
Discussion with wife, son and patient about home services. Son reviewed list for choice. [facility] chosen, contacted Kristen with [facility] to follow patient.

## 2014-08-29 NOTE — Progress Notes (Signed)
Occupational Therapy Treatment Patient Details Name: Shane [name] MRN: [id number] DOB: July 03, 1926 Today's Date: 08/29/2014    History of present illness 81 yo male admitted 08/24/14  with abdominal pain. Found with Perforated bowel with resection and colostomy formation.     OT comments  Pt with significant improvements.  He currently requires min guard to min A with ADLs and functional mobility.  He moves slowly.  He reports wife can assist him as necessary at discharge.  Recommend HHOT.   Follow Up Recommendations  Home health OT;Supervision/Assistance - 24 hour    Equipment Recommendations  3 in 1 bedside comode    Recommendations for Other Services      Precautions / Restrictions Precautions Precautions: Fall Precaution Comments: colostomy, incontinence       Mobility Bed Mobility               General bed mobility comments: pt up in recliner on arrival  Transfers Overall transfer level: Needs assistance Equipment used: 1 person hand held assist Transfers: Sit to/from Stand;Stand Pivot Transfers Sit to Stand: Min guard Stand pivot transfers: Min guard       General transfer comment: verbal cues for hand placement, posterior lean against recliner upon standing observed    Balance Overall balance assessment: Needs assistance Sitting-balance support: Feet supported Sitting balance-Leahy Scale: Good     Standing balance support: Single extremity supported;During functional activity Standing balance-Leahy Scale: Fair                     ADL Overall ADL's : Needs assistance/impaired             Lower Body Bathing: Min guard;Sit to/from stand   Upper Body Dressing : Set up;Sitting   Lower Body Dressing: Minimal assistance;Sit to/from stand Lower Body Dressing Details (indicate cue type and reason): Pt requires min A to pull socks over toes.  Moves slowly requiring increased time to complete activities  Toilet Transfer: Min  guard;Ambulation;Comfort height toilet;Grab bars   Toileting- Clothing Manipulation and Hygiene: Min guard;Sit to/from stand   Tub/ Shower Transfer: Min guard;Ambulation;Shower seat;Grab bars   Functional mobility during ADLs: Min guard (pushing IV pole ) General ADL Comments: Pt reports wife assisted him with donning socks today, and will be able to assist him as needed at home.       Vision                     Perception     Praxis      Cognition   Behavior During Therapy: WFL for tasks assessed/performed Overall Cognitive Status: Within Functional Limits for tasks assessed                       Extremity/Trunk Assessment               Exercises     Shoulder Instructions       General Comments      Pertinent Vitals/ Pain       Pain Assessment: No/denies pain  Home Living                                          Prior Functioning/Environment              Frequency Min 2X/week     Progress Toward Goals  OT Goals(current goals can  now be found in the care plan section)  Progress towards OT goals: Progressing toward goals  ADL Goals Pt Will Perform Grooming: with supervision;standing Pt Will Perform Lower Body Bathing: with min assist;sit to/from stand;with adaptive equipment Pt Will Perform Lower Body Dressing: with min assist;with adaptive equipment;sit to/from stand Pt Will Transfer to Toilet: with min assist;ambulating;bedside commode Additional ADL Goal #1: pt will perform bed mobility with min A from flat bed in preparation for ADLs  Plan Discharge plan needs to be updated    Co-evaluation                 End of Session     Activity Tolerance Patient tolerated treatment well   Patient Left with family/visitor present;with nursing/sitter in room   Nurse Communication Mobility status        Time: 9597-4718 OT Time Calculation (min): 25 min  Charges: OT General Charges $OT Visit: 1  Procedure OT Treatments $Self Care/Home Management : 8-22 mins $Therapeutic Activity: 8-22 mins  Bomani Oommen M 08/29/2014, 3:56 PM

## 2014-08-29 NOTE — Progress Notes (Addendum)
Physical Therapy Treatment Patient Details Name: Sire Poet MRN: [id number] DOB: 01-11-1927 Today's Date: 06-Sep-2014    History of Present Illness 81 yo male admitted 08/24/14  with abdominal pain. Found with Perforated bowel with resection and colostomy formation.      PT Comments    Pt's mobility improving and he reports plan to d/c home (refusing SNF per chart).  Pt states he has SPC to use at home.  Pt also practiced a couple steps since he reports having 4-5 steps to enter home however would recommend someone at home to assist into house upon d/c.     Follow Up Recommendations  Home health PT;Supervision for mobility/OOB (pt declining SNF)     Equipment Recommendations  None recommended by PT    Recommendations for Other Services       Precautions / Restrictions Precautions Precautions: Fall Precaution Comments: colostomy, incontinence    Mobility  Bed Mobility               General bed mobility comments: pt up in recliner on arrival  Transfers Overall transfer level: Needs assistance Equipment used: Rolling walker (2 wheeled) Transfers: Sit to/from Stand Sit to Stand: Min guard         General transfer comment: verbal cues for hand placement, posterior lean against recliner upon standing observed  Ambulation/Gait Ambulation/Gait assistance: Min guard Ambulation Distance (Feet): 250 Feet Assistive device: None Gait Pattern/deviations: Step-through pattern;Decreased stride length;Shuffle;Narrow base of support     General Gait Details: pt pushed IV pole for support, states he has a SPC he can use at home   Stairs Stairs: Yes Stairs assistance: Min guard Stair Management: Step to pattern;Forwards;One rail Right Number of Stairs: 3 General stair comments: pt used rail for support, no unsteadiness observed or assist required, min/guard for safety  Wheelchair Mobility    Modified Rankin (Stroke Patients Only)       Balance                                     Cognition Arousal/Alertness: Awake/alert Behavior During Therapy: WFL for tasks assessed/performed Overall Cognitive Status: Within Functional Limits for tasks assessed                      Exercises      General Comments        Pertinent Vitals/Pain Pain Assessment: No/denies pain    Home Living                      Prior Function            PT Goals (current goals can now be found in the care plan section) Progress towards PT goals: Progressing toward goals    Frequency  Min 3X/week    PT Plan Current plan remains appropriate    Co-evaluation             End of Session Equipment Utilized During Treatment: Gait belt Activity Tolerance: No increased pain;Patient tolerated treatment well Patient left: in chair;with call bell/phone within reach;with chair alarm set     Time: 1131-1145 PT Time Calculation (min) (ACUTE ONLY): 14 min  Charges:  $Gait Training: 8-22 mins                    G Codes:      Gennie Eisinger,KATHrine E 09-06-14, 1:18 PM Carmelia Bake, PT,  DPT 08/29/2014 Pager: [phone]

## 2014-08-29 NOTE — Discharge Instructions (Signed)
[facility]      [facility] °[phone] ° °OPEN ABDOMINAL SURGERY: POST OP INSTRUCTIONS ° °Always review your discharge instruction sheet given to you by the facility where your surgery was performed. ° °IF YOU HAVE DISABILITY OR FAMILY LEAVE FORMS, YOU MUST BRING THEM TO THE OFFICE FOR PROCESSING.  PLEASE DO NOT GIVE THEM TO YOUR DOCTOR. ° °1. A prescription for pain medication may be given to you upon discharge.  Take your pain medication as prescribed, if needed.  If narcotic pain medicine is not needed, then you may take acetaminophen (Tylenol) or ibuprofen (Advil) as needed. °2. Take your usually prescribed medications unless otherwise directed. °3. If you need a refill on your pain medication, please contact your pharmacy. They will contact our office to request authorization.  Prescriptions will not be filled after 5pm or on week-ends. °4. You should follow a light diet the first few days after arrival home, such as soup and crackers, pudding, etc.unless your doctor has advised otherwise. A high-fiber, low fat diet can be resumed as tolerated.   Be sure to include lots of fluids daily. Most patients will experience some swelling and bruising on the chest and neck area.  Ice packs will help.  Swelling and bruising can take several days to resolve °5. Most patients will experience some swelling and bruising in the area of the incision. Ice pack will help. Swelling and bruising can take several days to resolve..  °6. It is common to experience some constipation if taking pain medication after surgery.  Increasing fluid intake and taking a stool softener will usually help or prevent this problem from occurring.  A mild laxative (Milk of Magnesia or Miralax) should be taken according to package directions if there are no bowel movements after 48 hours. °7.  You may have steri-strips (small skin tapes) in place directly over the incision.  These strips should be left on the skin for 7-10 days.  If your  surgeon used skin glue on the incision, you may shower in 24 hours.  The glue will flake off over the next 2-3 weeks.  Any sutures or staples will be removed at the office during your follow-up visit. You may find that a light gauze bandage over your incision may keep your staples from being rubbed or pulled. You may shower and replace the bandage daily. °8. ACTIVITIES:  You may resume regular (light) daily activities beginning the next day--such as daily self-care, walking, climbing stairs--gradually increasing activities as tolerated.  You may have sexual intercourse when it is comfortable.  Refrain from any heavy lifting or straining until approved by your doctor. °a. You may drive when you no longer are taking prescription pain medication, you can comfortably wear a seatbelt, and you can safely maneuver your car and apply brakes °b. Return to Work: ___________________________________ °9. You should see your doctor in the office for a follow-up appointment approximately two weeks after your surgery.  Make sure that you call for this appointment within a day or two after you arrive home to insure a convenient appointment time. °OTHER INSTRUCTIONS:  °_____________________________________________________________ °_____________________________________________________________ ° °WHEN TO CALL YOUR DOCTOR: °1. Fever over 101.0 °2. Inability to urinate °3. Nausea and/or vomiting °4. Extreme swelling or bruising °5. Continued bleeding from incision. °6. Increased pain, redness, or drainage from the incision. °7. Difficulty swallowing or breathing °8. Muscle cramping or spasms. °9. Numbness or tingling in hands or feet or around lips. ° °The clinic staff is available to   answer your questions during regular business hours.  Please don’t hesitate to call and ask to speak to one of the nurses if you have concerns. ° °For further questions, please visit [email or web address] ° °Colostomy Home Guide °A colostomy is an  opening for stool to leave your body when a medical condition prevents it from leaving through the usual opening (rectum). During a surgery, a piece of large intestine (colon) is brought through a hole in the abdominal wall. The new opening is called a stoma or ostomy. A bag or pouch fits over the stoma to catch stool and gas. Your stool may be liquid, somewhat pasty, or formed. °CARING FOR YOUR STOMA  °Normally, the stoma looks a lot like the inside of your cheek: pink, red, and moist. At first it may be swollen, but this swelling will decrease within 6 weeks. °Keep the skin around your stoma clean and dry. You can gently wash your stoma and the skin around your stoma in the shower with a clean, soft washcloth. If you develop any skin irritation, your caregiver may give you a stoma powder or ointment to help heal the area. Do not use any products other than those specifically given to you by your caregiver.  °Your stoma should not be uncomfortable. If you notice any stinging or burning, your pouch may be leaking, and the skin around your stoma may be coming into contact with stool. This can cause skin irritation. If you notice stinging, replace your pouch with a new one and discard the old one. °OSTOMY POUCHES  °The pouch that fits over the ostomy can be made up of either 1 or 2 pieces. A one-piece pouch has a skin barrier piece and the pouch itself in one unit. A two-piece pouch has a skin barrier with a separate pouch that snaps on and off of the skin barrier. Either way, you should empty the pouch when it is only  to ½ full. Do not let more stool or gas build up. This could cause the pouch to leak. °Some ostomy bags have a built-in gas release valve. Ostomy deodorizer (5 drops) can be put into the pouch to prevent odor. Some people use ostomy lubricant drops inside the pouch to help the stool slide out of the bag more easily and completely.  °EMPTYING YOUR OSTOMY POUCH  °You may get lessons on how to empty your  pouch from a wound-ostomy nurse before you leave the hospital. Here are the basic steps: °· Wash your hands with soap and water. °· Sit far back on the toilet. °· Put several pieces of toilet paper into the toilet water. This will prevent splashing as you empty the stool into the toilet bowl. °· Unclip or unvelcro the tail end of the pouch. °· Unroll the tail and empty stool into the toilet. °· Clean the tail with toilet paper. °· Reroll the tail, and clip or velcro it closed. °· Wash your hands again. °CHANGING YOUR OSTOMY POUCH  °Change your ostomy pouch about every 3 to 4 days for the first 6 weeks, then every 5 to7 days. Always change the bag sooner if there is any leakage or you begin to notice any discomfort or irritation of the skin around the stoma. When possible, plan to change your ostomy pouch before eating or drinking as this will lessen the chance of stool coming out during the pouch change. A wound-ostomy nurse may teach you how to change your pouch before you leave the hospital. Here are   the basic steps:  [place] out your supplies.  Wash your hands with soap and water.  Carefully remove the old pouch.  Wash the stoma and allow it to dry. Men may be advised to shave any hair around the stoma very carefully. This will make the adhesive stick better.  Use the stoma measuring guide that comes with your pouch set to decide what size hole you will need to cut in the skin barrier piece. Choose the smallest possible size that will hold the stoma but will not touch it.  Use the guide to trace the circle on the back of the skin barrier piece. Cut out the hole.  Hold the skin barrier piece over the stoma to make sure the hole is the correct size.  Remove the adhesive paper backing from the skin barrier piece.  Squeeze stoma paste around the opening of the skin barrier piece.  Clean and dry the skin around the stoma again.  Carefully fit the skin barrier piece over your stoma.  If you are  using a two-piece pouch, snap the pouch onto the skin barrier piece.  Close the tail of the pouch.  Put your hand over the top of the skin barrier piece to help warm it for about 5 minutes, so that it conforms to your body better.  Wash your hands again. DIET TIPS   Continue to follow your usual diet.  Drink about eight 8 oz glasses of water each day.  You can prevent gas by eating slowly and chewing your food thoroughly.  If you feel concerned that you have too much gas, you can cut back on gas-producing foods, such as:  Spicy foods.  Onions and garlic.  Cruciferous vegetables (cabbage, broccoli, cauliflower, Brussels sprouts).  Beans and legumes.  Some cheeses.  Eggs.  Fish.  Bubbly (carbonated) drinks.  Chewing gum. GENERAL TIPS   You can shower with or without the bag in place.  Always keep the bag on if you are bathing or swimming.  If your bag gets wet, you can dry it with a blow-dryer set to cool.  Avoid wearing tight clothing directly over your stoma so that it does not become irritated or bleed. Tight clothing can also prevent stool from draining into the pouch.  It is helpful to always have an extra skin barrier and pouch with you when traveling. Do not leave them anywhere too warm, as parts of them can melt.  Do not let your seat belt rest on your stoma. Try to keep the seat belt either above or below your stoma, or use a tiny pillow to cushion it.  You can still participate in sports, but you should avoid activities in which there is a risk of getting hit in the abdomen.  You can still have sex. It is a good idea to empty your pouch prior to sex. Some people and their partners feel very comfortable seeing the pouch during sex. Others choose to wear lingerie or a T-shirt that covers the device. SEEK IMMEDIATE MEDICAL CARE IF:  You notice a change in the size or color of the stoma, especially if it becomes very red, purple, black, or pale white.  You  have bloody stools or bleeding from the stoma.  You have abdominal pain, nausea, vomiting, or bloating.  There is anything unusual protruding from the stoma.  You have irritation or red skin around the stoma.  No stool is passing from the stoma.  You have diarrhea (requiring more frequent  than normal pouch emptying). Document Released: 03/31/2003 Document Revised: 06/20/2011 Document Reviewed: 08/25/2010 [facility] Patient Information 2015 [employer], [place]. This information is not intended to replace advice given to you by your health care provider. Make sure you discuss any questions you have with your health care provider.  Dressing Change A dressing is a material placed over wounds. It keeps the wound clean, dry, and protected from further injury. This provides an environment that favors wound healing.  BEFORE YOU BEGIN  Get your supplies together. Things you may need include:  Saline solution.  Flexible gauze dressing.  Medicated cream.  Tape.  Gloves.  Abdominal dressing pads.  Gauze squares.  Plastic bags.  Take pain medicine 30 minutes before the dressing change if you need it.  Take a shower before you do the first dressing change of the day. Use plastic wrap or a plastic bag to prevent the dressing from getting wet. REMOVING YOUR OLD DRESSING   Wash your hands with soap and water. Dry your hands with a clean towel.  Put on your gloves.  Remove any tape.  Carefully remove the old dressing. If the dressing sticks, you may dampen it with warm water to loosen it, or follow your caregiver's specific directions.  Remove any gauze or packing tape that is in your wound.  Take off your gloves.  Put the gloves, tape, gauze, or any packing tape into a plastic bag. CHANGING YOUR DRESSING  Open the supplies.  Take the cap off the saline solution.  Open the gauze package so that the gauze remains on the inside of the package.  Put on your gloves.  Clean your  wound as told by your caregiver.  If you have been told to keep your wound dry, follow those instructions.  Your caregiver may tell you to do one or more of the following:  Pick up the gauze. Pour the saline solution over the gauze. Squeeze out the extra saline solution.  Put medicated cream or other medicine on your wound if you have been told to do so.  Put the solution soaked gauze only in your wound, not on the skin around it.  Pack your wound loosely or as told by your caregiver.  Put dry gauze on your wound.  Put abdominal dressing pads over the dry gauze if your wet gauze soaks through.  Tape the abdominal dressing pads in place so they will not fall off. Do not wrap the tape completely around the affected part (arm, leg, abdomen).  Wrap the dressing pads with a flexible gauze dressing to secure it in place.  Take off your gloves. Put them in the plastic bag with the old dressing. Tie the bag shut and throw it away.  Keep the dressing clean and dry until your next dressing change.  Wash your hands. SEEK MEDICAL CARE IF:  Your skin around the wound looks red.  Your wound feels more tender or sore.  You see pus in the wound.  Your wound smells bad.  You have a fever.  Your skin around the wound has a rash that itches and burns.  You see black or yellow skin in your wound that was not there before.  You feel nauseous, throw up, and feel very tired. Document Released: 05/05/2004 Document Revised: 06/20/2011 Document Reviewed: 02/07/2011 [facility] Patient Information 2015 [place], [place]. This information is not intended to replace advice given to you by your health care provider. Make sure you discuss any questions you have with your  health care provider. ° °

## 2014-08-30 LAB — CBC
HCT: 29.7 % — ABNORMAL LOW (ref 39.0–52.0)
Hemoglobin: 9.6 g/dL — ABNORMAL LOW (ref 13.0–17.0)
MCH: 28.7 pg (ref 26.0–34.0)
MCHC: 32.3 g/dL (ref 30.0–36.0)
MCV: 88.7 fL (ref 78.0–100.0)
PLATELETS: 51 10*3/uL — AB (ref 150–400)
RBC: 3.35 MIL/uL — ABNORMAL LOW (ref 4.22–5.81)
RDW: 14.6 % (ref 11.5–15.5)
WBC: 7.4 10*3/uL (ref 4.0–10.5)

## 2014-08-30 LAB — GLUCOSE, CAPILLARY
GLUCOSE-CAPILLARY: 113 mg/dL — AB (ref 65–99)
GLUCOSE-CAPILLARY: 91 mg/dL (ref 65–99)
GLUCOSE-CAPILLARY: 98 mg/dL (ref 65–99)
GLUCOSE-CAPILLARY: 136 mg/dL — AB (ref 65–99)
Glucose-Capillary: 101 mg/dL — ABNORMAL HIGH (ref 65–99)
Glucose-Capillary: 111 mg/dL — ABNORMAL HIGH (ref 65–99)

## 2014-08-30 MED ORDER — HYDROCODONE-ACETAMINOPHEN 5-325 MG PO TABS: 1.0000 | ORAL_TABLET | Freq: Four times a day (QID) | ORAL | Status: DC | PRN

## 2014-08-30 MED ORDER — HYDROCODONE-ACETAMINOPHEN 5-325 MG PO TABS
1.0000 | ORAL_TABLET | Freq: Four times a day (QID) | ORAL | Status: DC | PRN
  Administered 2014-08-30: 1 via ORAL
  Filled 2014-08-30: qty 1

## 2014-08-30 MED ORDER — INSULIN ASPART 100 UNIT/ML ~~LOC~~ SOLN: 0.0000 [IU] | Freq: Three times a day (TID) | SUBCUTANEOUS | Status: DC

## 2014-08-30 MED ORDER — INSULIN ASPART 100 UNIT/ML ~~LOC~~ SOLN: 0.0000 [IU] | Freq: Every day | SUBCUTANEOUS | Status: DC

## 2014-08-30 NOTE — Progress Notes (Signed)
Notified [facility] of scheduled dc today with HHRN for dressing change. [facility] to schedule soc for 08/30/2014.Jonnie Finner RN CCM Case Mgmt phone [phone]

## 2014-08-30 NOTE — Progress Notes (Signed)
Pt's wife taught to change pt's dressing and needs encouragement, but was able to do so. Pt encouraged to empty ostomy and was able to do so successfully, but does not seem to understand how frequently this must be done. Repeatedly today when i would find ostomy 1/3 full i would say we need to empty that--and with encouragement he would but he does not initiate process.  Discussed with him and wife that he refused rehab and must learn to do this that advanced home care will not be available 24/7 to empty ostomy. He and his wife are reconsidering rehab and would like to discuss it with doctor and social work. Reported this to night rn

## 2014-08-31 LAB — GLUCOSE, CAPILLARY
GLUCOSE-CAPILLARY: 83 mg/dL (ref 65–99)
GLUCOSE-CAPILLARY: 107 mg/dL — AB (ref 65–99)

## 2014-08-31 NOTE — Progress Notes (Signed)
Report called to [facility] and given to [facility] regarding pt status, dx, hx and reason for transport to facility. Vital signs reported. Awaiting PTAR to arrive for transport.

## 2014-08-31 NOTE — Progress Notes (Signed)
General Surgery Note  LOS: 7 days  POD -  7 Days Post-Op  Assessment/Plan: 1.  EXPLORATORY LAPAROTOMY, RESECTION SIGMOID COLON, COLOSTOMY - 08/24/2014 - [facility]  For sigmoid colon perf  On Zosyn  Family, who at first refused rehab/SNF, now want rehab/SNF.  It is Sunday and I am not sure how much can be done today to arrange this change in discharge plans.  1A.  Open wound - clean  2.  Metastatic prostate cancer 3.  HTN 4.  Hyperthyroidism 5.  DM 6.  DVT prophylaxis - on no chemoprophylaxis, admitted with thrombocytopenia 7.  On Prednisone  For med for prostate cancer 8.  Anemia   Hgb - 9.6 - 08/30/2014   Principal Problem:   Perforation of sigmoid colon s/p hartmann procedure 08/24/14 Active Problems:   Colon perforation   Subjective:  Alert.  No complaint.  There is some confusion about where the patient is going.  Originally he refused rehab/SNF, now wants to reconsider.  In the room by himself. Objective:   Filed Vitals:   08/31/14 0449  BP: 151/61  Pulse: 60  Temp: 99.8 F (37.7 C)  Resp: 16     Intake/Output from previous day:  05/21 0701 - 05/22 0700 In: 3530 [P.O.:480; I.V.:3000; IV Piggyback:50] Out: 1475 [Urine:1125; Stool:350]  Intake/Output this shift:      Physical Exam:   General: Older AA who is alert.  Eating breakfast.   HEENT: Normal. Pupils equal. .   Lungs: Clear.   Abdomen: Soft.  BS present.   Wound: Clean, though ostomy bag leaking into wound.  LLQ ostomy.   Lab Results:    Recent Labs  08/29/14 0816 08/30/14 0459  WBC 7.2 7.4  HGB 10.5* 9.6*  HCT 31.7* 29.7*  PLT 55* 51*    BMET   Recent Labs  08/29/14 0816  NA 149*  K 3.5  CL 121*  CO2 21*  GLUCOSE 118*  BUN 23*  CREATININE 1.18  CALCIUM 8.2*    PT/INR  No results for input(s): LABPROT, INR in the last 72 hours.  ABG  No results for input(s): PHART, HCO3 in the last 72 hours.  Invalid input(s): PCO2, PO2   Studies/Results:  No results  found.   Anti-infectives:   Anti-infectives    Start     Dose/Rate Route Frequency Ordered Stop   08/24/14 2200  piperacillin-tazobactam (ZOSYN) IVPB 3.375 g     3.375 g 12.5 mL/hr over 240 Minutes Intravenous Every 8 hours 08/24/14 2038     05 /15/16 1415  piperacillin-tazobactam (ZOSYN) IVPB 3.375 g     3.375 g 100 mL/hr over 30 Minutes Intravenous  Once 08/24/14 1413 08/24/14 1530      Alphonsa Overall, MD, FACS Pager: [facility] Office: [phone] 08/31/2014

## 2014-08-31 NOTE — Progress Notes (Signed)
Dr. Lucia Gaskins aware via phone pt concerned about penile swelling. No problems urinating. Denies pain or discomfort. No new orders received now or for facility [employer]). Colletta Maryland, RN called and updated regarding pt status.

## 2014-08-31 NOTE — Progress Notes (Signed)
Assessment unchanged. [facility] here to transport pt to [facility]. Recent vital signs reported to paramedics. Family at bedside collecting personal belongings. Pt discharged via stretcher to front entrance to meet awaiting ambulance to carry to facility. Accompanied by EMS paramedics x 2. Packet given to EMS ([facility]) to present at [facility].

## 2014-08-31 NOTE — Progress Notes (Signed)
Medicare Important Message given? YES  Date Medicare IM given:  08/31/2014 Medicare IM given by: Jonnie Finner  Notified [facility] that pt is dc to SNF. Jonnie Finner RN CCM Case Mgmt phone [phone]

## 2014-09-01 NOTE — Clinical Social Work Placement (Signed)
Late entry from 08/31/14  CSW received call from RN that patient's wife is at bedside & they have decided that they now want SNF for patient rather than home with home health. CSW spoke with patient & wife at bedside and explained that because he is ready for discharge today, our options will be limited. CSW provided patient & wife with SNF bed offers, patient & wife chose [facility] as it is closest to their home. CSW confirmed with Santiago Glad at [facility] that they could accept patient. [facility] called for transport. Discharge packet given to RN, Nevin Bloodgood.     Raynaldo Opitz, [facility] Clinical Social Worker (weekend coverage) Ph#: [phone]    CLINICAL SOCIAL WORK PLACEMENT  NOTE  Date:  09/01/2014  Patient Details  Name: Shane [name] MRN: [id number] Date of Birth: [birth year]  Clinical Social Work is seeking post-discharge placement for this patient at the [place] level of care (*CSW will initial, date and re-position this form in  chart as items are completed):  Yes   Patient/family provided with [facility] Work Department's list of facilities offering this level of care within the geographic area requested by the patient (or if unable, by the patient's family).  Yes   Patient/family informed of their freedom to choose among providers that offer the needed level of care, that participate in Medicare, Medicaid or managed care program needed by the patient, have an available bed and are willing to accept the patient.  Yes   Patient/family informed of [facility]'s ownership interest in [facility] and [facility], as well as of the fact that they are under no obligation to receive care at these facilities.  PASRR submitted to EDS on 08/31/14     PASRR number received on 08/31/14     Existing PASRR number confirmed on       FL2 transmitted to all facilities in geographic area requested by  pt/family on 08/31/14     FL2 transmitted to all facilities within larger geographic area on       Patient informed that his/her managed care company has contracts with or will negotiate with certain facilities, including the following:        Yes   Patient/family informed of bed offers received.  Patient chooses bed at [facility]     Physician recommends and patient chooses bed at      Patient to be transferred to [facility] on 08/31/14.  Patient to be transferred to facility by PTAR     Patient family notified on 08/31/14 of transfer.  Name of family member notified:  patient's wife at bedside     PHYSICIAN       Additional Comment:    _______________________________________________ Standley Brooking, LCSW 09/01/2014, 3:09 PM

## 2014-09-10 ENCOUNTER — Telehealth: Payer: Self-pay | Admitting: *Deleted

## 2014-09-10 NOTE — Telephone Encounter (Signed)
Received call from [place] (sp?) DON [facility].  Pt. Was admitted to hospital 08/24/14 and then to their facility on 09/01/14.  Pt. Has not had his zytiga since he was admitted to their facility and she is trying to find out the status of this drug and to let Dr. Alen Blew know that he has not had it.   Called Brian at [facility]pharmacy:  RX has been refilled and is ready for son to pick up.  There is a $400 copay.  Pt's assistance fund has expired, however it would only take a phone call to have it re-instated.  Son was given phone number yesterday, but as yet, it has not been re-instated.   Left message with these details for [facility].  They will be able to administer this if son brings it to them.  Will route this info to Dr.Shadad.

## 2014-09-16 ENCOUNTER — Encounter: Payer: Self-pay | Admitting: *Deleted

## 2014-10-01 ENCOUNTER — Other Ambulatory Visit: Payer: Self-pay | Admitting: Oncology

## 2014-10-01 ENCOUNTER — Telehealth: Payer: Self-pay | Admitting: *Deleted

## 2014-10-01 NOTE — Telephone Encounter (Signed)
VM message received form patient @ 3:38 pm. TC to patient. He was calling in regards to Ztiga refill. Informed that his prescription was sent to [facility] today. He voiced understanding.

## 2014-10-05 ENCOUNTER — Emergency Department (HOSPITAL_COMMUNITY)
Admission: EM | Admit: 2014-10-05 | Discharge: 2014-10-05 | Disposition: A | Discharge status: Home / Self Care | Payer: Medicare Other | Attending: Emergency Medicine | Admitting: Emergency Medicine

## 2014-10-05 ENCOUNTER — Encounter (HOSPITAL_COMMUNITY): Payer: Self-pay | Admitting: Emergency Medicine

## 2014-10-05 DIAGNOSIS — Z933 Colostomy status: Secondary | ICD-10-CM | POA: Insufficient documentation

## 2014-10-05 DIAGNOSIS — I2510 Atherosclerotic heart disease of native coronary artery without angina pectoris: Secondary | ICD-10-CM | POA: Insufficient documentation

## 2014-10-05 DIAGNOSIS — R011 Cardiac murmur, unspecified: Secondary | ICD-10-CM | POA: Insufficient documentation

## 2014-10-05 DIAGNOSIS — Z7982 Long term (current) use of aspirin: Secondary | ICD-10-CM | POA: Insufficient documentation

## 2014-10-05 DIAGNOSIS — Z8659 Personal history of other mental and behavioral disorders: Secondary | ICD-10-CM | POA: Insufficient documentation

## 2014-10-05 DIAGNOSIS — Z87891 Personal history of nicotine dependence: Secondary | ICD-10-CM | POA: Diagnosis not present

## 2014-10-05 DIAGNOSIS — K219 Gastro-esophageal reflux disease without esophagitis: Secondary | ICD-10-CM | POA: Insufficient documentation

## 2014-10-05 DIAGNOSIS — Z8546 Personal history of malignant neoplasm of prostate: Secondary | ICD-10-CM | POA: Insufficient documentation

## 2014-10-05 DIAGNOSIS — Z9049 Acquired absence of other specified parts of digestive tract: Secondary | ICD-10-CM | POA: Diagnosis not present

## 2014-10-05 DIAGNOSIS — K5900 Constipation, unspecified: Secondary | ICD-10-CM

## 2014-10-05 DIAGNOSIS — Z791 Long term (current) use of non-steroidal anti-inflammatories (NSAID): Secondary | ICD-10-CM | POA: Insufficient documentation

## 2014-10-05 DIAGNOSIS — E119 Type 2 diabetes mellitus without complications: Secondary | ICD-10-CM | POA: Diagnosis not present

## 2014-10-05 NOTE — ED Notes (Signed)
Patient states his colostomy bag was changed yesterday between 1300-1500, patient has not had any stool produced since that time. Patient last meal was dinner on 10/04/2014. Denies pain. Patient states he has been having air into the bag that he has been releasing.

## 2014-10-05 NOTE — Discharge Instructions (Signed)

## 2014-10-08 NOTE — ED Provider Notes (Signed)
CSN: [id number]     Arrival date & time 10/05/14  0840 History   First MD Initiated Contact with Patient 10/05/14 [phone]     Chief Complaint  Patient presents with  . colostomy not draining      (Consider location/radiation/quality/duration/timing/severity/associated sxs/prior Treatment) HPI   81 year old male with decreased stool in his ostomy since yesterday. Status post exploratory laparotomy with resection of sigmoid colon and colostomy on May 15. Has noticed air in the bag though. Denies any acute pain. No nausea. No distention. No fevers or chills. No acute urinary complaints.  Past Medical History  Diagnosis Date  . Prostate ca     prostate ca dx 20 yrs ago  . Hypertension   . Hyperlipidemia   . Hyperthyroidism   . Anxiety   . Dysrhythmia   . Heart murmur   . Constipation   . GERD (gastroesophageal reflux disease)   . Diabetes mellitus     Type II  . CAD (coronary artery disease)     08/25/03 cath [facility]): NL coronaries, EF 70-75%    Past Surgical History  Procedure Laterality Date  . Appendectomy    . Penile prosthesis implant    . Hernia repair Left   . Colonoscopy    . Inguinal hernia repair  07/14/2014    with mesh  . Prostatectomy    . Inguinal hernia repair Right 07/14/2014    Procedure: OPEN REPAIR RIGHT INGUINAL HERNIA ;  Surgeon: Fanny Skates, MD;  Location: [place];  Service: General;  Laterality: Right;  . Insertion of mesh Right 07/14/2014    Procedure: INSERTION OF MESH;  Surgeon: Fanny Skates, MD;  Location: [place];  Service: General;  Laterality: Right;  . Laparotomy N/A 08/24/2014    Procedure: EXPLORATORY LAPAROTOMY, RESECTION SIGMOID COLON, COLOSTOMY;  Surgeon: Jackolyn Confer, MD;  Location: [facility];  Service: General;  Laterality: N/A;   History reviewed. No pertinent family history. History  Substance Use Topics  . Smoking status: Former Smoker -- 35 years  . Smokeless tobacco: Never Used     Comment: 07/07/14  . Alcohol Use: No    Review of  Systems  All systems reviewed and negative, other than as noted in HPI.   Allergies  Review of patient's allergies indicates no known allergies.  Home Medications   Prior to Admission medications   Medication Sig Start Date End Date Taking? Authorizing Provider  aspirin EC 81 MG tablet Take 81 mg by mouth at bedtime.    Historical Provider, MD  atorvastatin (LIPITOR) 20 MG tablet Take 20 mg by mouth at bedtime.     Historical Provider, MD  Cholecalciferol (VITAMIN D PO) Take 1 tablet by mouth daily.    Historical Provider, MD  [employer]S SENNA PLUS 8.6-50 MG per tablet TAKE 2 TABLETS BY MOUTH 2 (TWO) TIMES DAILY. Patient not taking: Reported on 07/07/2014 06/21/14   Wyatt Portela, MD  erythromycin ophthalmic ointment Place into the left eye 4 (four) times daily. Apply 1/2 inch to left eye 4 times a day for 5 days. Patient taking differently: Place 1 application into the right eye daily as needed (irritation).  09/17/12   Maryanna Shape, NP  gemfibrozil (LOPID) 600 MG tablet Take 600 mg by mouth 2 (two) times daily before a meal.    Historical Provider, MD  glipiZIDE (GLUCOTROL XL) 2.5 MG 24 hr tablet Take 2.5 mg by mouth daily. 06/24/14   Historical Provider, MD  HYDROcodone-acetaminophen (NORCO) 5-325 MG per tablet Take  1-2 tablets by mouth every 6 (six) hours as needed for moderate pain or severe pain. 08/30/14   Stark Klein, MD  Lactulose 20 GM/30ML SOLN Take 30 mLs (20 g total) by mouth 2 (two) times daily as needed (For constipation). Patient taking differently: Take 30 mLs by mouth daily as needed (For constipation).  10/16/13   Maryanna Shape, NP  losartan-hydrochlorothiazide (HYZAAR) 100-25 MG per tablet Take 0.5 tablets by mouth daily.     Historical Provider, MD  metoCLOPramide (REGLAN) 5 MG tablet Take 5 mg by mouth 4 (four) times daily -  before meals and at bedtime.  05/27/14   Historical Provider, MD  omeprazole (PRILOSEC) 20 MG capsule Take 20 mg by mouth daily before breakfast.      Historical Provider, MD  OVER THE COUNTER MEDICATION Place 1 drop into the left eye at bedtime as needed (irritation). Over the counter eye drops    Historical Provider, MD  predniSONE (DELTASONE) 5 MG tablet TAKE 1 TABLET BY MOUTH DAILY 07/23/14   Wyatt Portela, MD  risedronate (ACTONEL) 35 MG tablet Take 35 mg by mouth every 7 (seven) days. On Mondays 02/18/14   Historical Provider, MD  sulindac (CLINORIL) 200 MG tablet Take 200 mg by mouth daily.     Historical Provider, MD  vitamin C (ASCORBIC ACID) 500 MG tablet Take 500 mg by mouth daily.    Historical Provider, MD  ZYTIGA 250 MG tablet TAKE 4 TABLETS BY MOUTH DAILY ON AN EMPTY STOMACH 1 HOUR BEFORE OR 2 HOURS AFTER A MEAL 10/01/14   Wyatt Portela, MD  ZYTIGA 250 MG tablet TAKE 4 TABLETS BY MOUTH DAILY ON AN EMPTY STOMACH 1 HOUR BEFORE OR 2 HOURS AFTER A MEAL 10/01/14   Wyatt Portela, MD   BP 138/52 mmHg  Pulse 67  Temp(Src) 98.5 F (36.9 C) (Oral)  Resp 17  SpO2 100% Physical Exam  Constitutional: He appears well-developed and well-nourished. No distress.  HENT:  Head: Normocephalic and atraumatic.  Eyes: Conjunctivae are normal. Right eye exhibits no discharge. Left eye exhibits no discharge.  Neck: Neck supple.  Cardiovascular: Normal rate, regular rhythm and normal heart sounds.  Exam reveals no gallop and no friction rub.   No murmur heard. Pulmonary/Chest: Effort normal and breath sounds normal. No respiratory distress.  Abdominal: Soft. He exhibits no distension. There is no tenderness.  Colostomy with thick pasty brown stool. Abdomen is soft and nondistended.  Musculoskeletal: He exhibits no edema or tenderness.  Neurological: He is alert.  Skin: Skin is warm and dry.  Psychiatric: He has a normal mood and affect. His behavior is normal. Thought content normal.  Nursing note and vitals reviewed.   ED Course  Procedures (including critical care time) Labs Review Labs Reviewed - No data to display  Imaging Review No  results found.   EKG Interpretation None      MDM   Final diagnoses:  Constipation, unspecified constipation type    81 year old male with decreased output from his ostomy. There is currently still on his back. No obstructive symptoms. Nontender. No distention.    Virgel Manifold, MD 10/08/14 [phone]

## 2014-10-10 ENCOUNTER — Ambulatory Visit (INDEPENDENT_AMBULATORY_CARE_PROVIDER_SITE_OTHER): Payer: Medicare Other | Admitting: Neurology

## 2014-10-10 ENCOUNTER — Encounter: Payer: Self-pay | Admitting: Neurology

## 2014-10-10 VITALS — BP 115/55 | HR 57 | Ht 68.0 in | Wt 144.4 lb

## 2014-10-10 DIAGNOSIS — R0989 Other specified symptoms and signs involving the circulatory and respiratory systems: Secondary | ICD-10-CM | POA: Diagnosis not present

## 2014-10-10 DIAGNOSIS — E538 Deficiency of other specified B group vitamins: Secondary | ICD-10-CM

## 2014-10-10 DIAGNOSIS — R413 Other amnesia: Secondary | ICD-10-CM

## 2014-10-10 HISTORY — DX: Other amnesia: R41.3

## 2014-10-10 NOTE — Patient Instructions (Signed)
    We will do today is check some blood work looking for issues that may affect the memory. I will check a CT scan of the brain, and call with results. If you desire in the future, we may consider medications for memory.

## 2014-10-10 NOTE — Progress Notes (Signed)
Reason for visit: Memory disorder  Referring physician: Dr. Vira Agar is a 81 y.o. male  History of present illness:  Shane [name] is an 81 year old right-handed white male with a history of a memory disorder that has been gradually worsening over the last one year. The patient recently has had surgery for perforated bowel, he has had some weight loss with this. The patient lives with his wife, and his son checks on him on a regular basis. Until he had a bowel surgery, he has been operating a motor vehicle without difficulty. He manages his own finances, and keeps track of his own medications and appointments. The patient indicates that he has some difficulty with memory and names of people and places, he has difficulty remembering recent events. He will misplace things about the house on occasion. He indicates that he sleeps well, he has a good energy level. He denies any numbness or weakness on the arms or legs, and he denies issues controlling the bowels or the bladder. He denies headache, fevers or chills. The patient has had some left-sided tinnitus. He comes into the office today for an evaluation.  Past Medical History  Diagnosis Date  . Prostate ca     prostate ca dx 20 yrs ago  . Hypertension   . Hyperlipidemia   . Hyperthyroidism   . Anxiety   . Dysrhythmia   . Heart murmur   . Constipation   . GERD (gastroesophageal reflux disease)   . Diabetes mellitus     Type II  . CAD (coronary artery disease)     08/25/03 cath [facility]): NL coronaries, EF 70-75%   . Memory difficulties 10/10/2014    Past Surgical History  Procedure Laterality Date  . Appendectomy    . Penile prosthesis implant    . Hernia repair Left   . Colonoscopy    . Inguinal hernia repair  07/14/2014    with mesh  . Prostatectomy    . Inguinal hernia repair Right 07/14/2014    Procedure: OPEN REPAIR RIGHT INGUINAL HERNIA ;  Surgeon: Fanny Skates, MD;  Location: [place];  Service: General;   Laterality: Right;  . Insertion of mesh Right 07/14/2014    Procedure: INSERTION OF MESH;  Surgeon: Fanny Skates, MD;  Location: [place];  Service: General;  Laterality: Right;  . Laparotomy N/A 08/24/2014    Procedure: EXPLORATORY LAPAROTOMY, RESECTION SIGMOID COLON, COLOSTOMY;  Surgeon: Jackolyn Confer, MD;  Location: [facility];  Service: General;  Laterality: N/A;  . Colostomy      Family History  Problem Relation Age of Onset  . CVA Mother   . Alcohol abuse Brother   . Dementia Sister     Social history:  reports that he has quit smoking. He has never used smokeless tobacco. He reports that he does not drink alcohol or use illicit drugs.  Medications:  Prior to Admission medications   Medication Sig Start Date End Date Taking? Authorizing Provider  aspirin EC 81 MG tablet Take 81 mg by mouth at bedtime.   Yes Historical Provider, MD  atorvastatin (LIPITOR) 20 MG tablet Take 20 mg by mouth at bedtime.    Yes Historical Provider, MD  calcium carbonate (TUMS EX) 750 MG chewable tablet Chew 1 tablet by mouth daily.   Yes Historical Provider, MD  Cholecalciferol (VITAMIN D PO) Take 1 tablet by mouth daily.   Yes Historical Provider, MD  CVS SENNA PLUS 8.6-50 MG per tablet TAKE 2 TABLETS BY  MOUTH 2 (TWO) TIMES DAILY. 06/21/14  Yes Wyatt Portela, MD  erythromycin ophthalmic ointment Place into the left eye 4 (four) times daily. Apply 1/2 inch to left eye 4 times a day for 5 days. Patient taking differently: Place 1 application into the right eye daily as needed (irritation).  09/17/12  Yes Maryanna Shape, NP  furosemide (LASIX) 40 MG tablet Take 40 mg by mouth daily.   Yes Historical Provider, MD  GARLIC PO Take 1 tablet by mouth daily.   Yes Historical Provider, MD  gemfibrozil (LOPID) 600 MG tablet Take 600 mg by mouth 2 (two) times daily before a meal.   Yes Historical Provider, MD  glipiZIDE (GLUCOTROL XL) 2.5 MG 24 hr tablet Take 2.5 mg by mouth daily. 06/24/14  Yes Historical Provider, MD    HYDROcodone-acetaminophen (NORCO) 5-325 MG per tablet Take 1-2 tablets by mouth every 6 (six) hours as needed for moderate pain or severe pain. 08/30/14  Yes Stark Klein, MD  Lactulose 20 GM/30ML SOLN Take 30 mLs (20 g total) by mouth 2 (two) times daily as needed (For constipation). Patient taking differently: Take 30 mLs by mouth daily as needed (For constipation).  10/16/13  Yes Maryanna Shape, NP  losartan-hydrochlorothiazide (HYZAAR) 100-25 MG per tablet Take 0.5 tablets by mouth daily.    Yes Historical Provider, MD  metoCLOPramide (REGLAN) 5 MG tablet Take 5 mg by mouth 4 (four) times daily -  before meals and at bedtime.  05/27/14  Yes Historical Provider, MD  omeprazole (PRILOSEC) 20 MG capsule Take 20 mg by mouth daily before breakfast.    Yes Historical Provider, MD  OVER THE COUNTER MEDICATION Place 1 drop into the left eye at bedtime as needed (irritation). Over the counter eye drops   Yes Historical Provider, MD  potassium chloride (K-DUR) 10 MEQ tablet Take 10 mEq by mouth daily.   Yes Historical Provider, MD  predniSONE (DELTASONE) 5 MG tablet TAKE 1 TABLET BY MOUTH DAILY 07/23/14  Yes Wyatt Portela, MD  risedronate (ACTONEL) 35 MG tablet Take 35 mg by mouth every 7 (seven) days. On Mondays 02/18/14  Yes Historical Provider, MD  sulindac (CLINORIL) 200 MG tablet Take 200 mg by mouth daily.    Yes Historical Provider, MD  vitamin C (ASCORBIC ACID) 500 MG tablet Take 500 mg by mouth daily.   Yes Historical Provider, MD  ZYTIGA 250 MG tablet TAKE 4 TABLETS BY MOUTH DAILY ON AN EMPTY STOMACH 1 HOUR BEFORE OR 2 HOURS AFTER A MEAL 10/01/14  Yes Wyatt Portela, MD  ZYTIGA 250 MG tablet TAKE 4 TABLETS BY MOUTH DAILY ON AN EMPTY STOMACH 1 HOUR BEFORE OR 2 HOURS AFTER A MEAL 10/01/14  Yes Wyatt Portela, MD      Allergies  Allergen Reactions  . Lisinopril     cough    ROS:  Out of a complete 14 system review of symptoms, the patient complains only of the following symptoms, and all  other reviewed systems are negative.  Hearing loss, ringing in the ears Memory loss  Blood pressure 115/55, pulse 57, height 5\' 8"  (1.727 m), weight 144 lb 6.4 oz (65.499 kg).  Physical Exam  General: The patient is alert and cooperative at the time of the examination.  Eyes: Pupils are equal, round, and reactive to light. Discs are flat bilaterally.  Neck: The neck is supple, bilateral bruits, left greater than right are noted, may possibly represent radiation of cardiac murmur.  Respiratory:  The respiratory examination is clear.  Cardiovascular: The cardiovascular examination reveals a regular rate and rhythm, a grade II/VI systolic murmur noted in the left lower sternal border.  Skin: Extremities are without significant edema.  Neurologic Exam  Mental status: The patient is alert and oriented x 2 at the time of the examination (not oriented to date). The Mini-Mental Status Examination done today shows a total score of 18/30. The patient is able to name 8 animals in 30 seconds.  Cranial nerves: Facial symmetry is present. There is good sensation of the face to pinprick and soft touch bilaterally. The strength of the facial muscles and the muscles to head turning and shoulder shrug are normal bilaterally. Speech is well enunciated, no aphasia or dysarthria is noted. Extraocular movements are full. Visual fields are full. The tongue is midline, and the patient has symmetric elevation of the soft palate. No obvious hearing deficits are noted.  Motor: The motor testing reveals 5 over 5 strength of all 4 extremities. Good symmetric motor tone is noted throughout.  Sensory: Sensory testing is intact to pinprick, soft touch, vibration sensation, and position sense on all 4 extremities. No evidence of extinction is noted.  Coordination: Cerebellar testing reveals good finger-nose-finger and heel-to-shin bilaterally.  Gait and station: Gait is normal. Tandem gait is slightly unsteady.  Romberg is negative. No drift is seen.  Reflexes: Deep tendon reflexes are symmetric and normal bilaterally. Toes are downgoing bilaterally.   Assessment/Plan:  1. Memory disturbance  2. Bilateral carotid bruit  The patient has had a progressive change in his memory over the last year. He will be set up for blood work today, and a CT scan of the brain will be done. We will consider initiation of medications for memory the future. The patient appears to have bilateral carotid bruits, left greater than right. A carotid Doppler study will be done. The patient will follow-up in 3-4 months. The driving issue will need to be scrutinized closely, there have been no reports of safety issues while driving, but the patient is in a moderate range of dementia by Mini-Mental Status Examination, typically would recommend that he stop driving at this point.  Shane Alexanders MD 10/10/2014 5:39 PM  [facility] [address] [place], [place] [address]  Phone [phone] Fax [phone]

## 2014-10-16 ENCOUNTER — Telehealth: Payer: Self-pay

## 2014-10-16 LAB — VITAMIN B12: VITAMIN B 12: 482 pg/mL (ref 211–946)

## 2014-10-16 LAB — RPR: RPR: NONREACTIVE

## 2014-10-16 LAB — COPPER, SERUM: Copper: 115 ug/dL (ref 72–166)

## 2014-10-16 LAB — METHYLMALONIC ACID, SERUM: Methylmalonic Acid: 323 nmol/L (ref 0–378)

## 2014-10-16 NOTE — Telephone Encounter (Signed)
-----   Message from Kathrynn Ducking, MD sent at 10/16/2014  9:54 AM EDT -----  The blood work results are unremarkable. Please call the patient.  ----- Message -----    From: [facility] Lab Results In Interface    Sent: 10/11/2014   7:40 AM      To: Kathrynn Ducking, MD

## 2014-10-16 NOTE — Telephone Encounter (Signed)
I called the patient and relayed results. 

## 2014-10-27 ENCOUNTER — Encounter: Payer: Self-pay | Admitting: Oncology

## 2014-10-27 NOTE — Progress Notes (Signed)
Pt called needing assistance with Zytiga.  My internet wasn't bringing up needymeds to check the foundations that offer assistance for that drug so I called [place] to inquire about who offers assistance for that drug.  Aaron Edelman informed me that he told the pt and the pt's son several times to get in touch with [employer]oma Aran for assistance.  I called the pt's son and advised him to call [name] to reapply for assistance since I wasn't able to go on their website and the pt was getting assistance from them in the past.  He verbalized understanding.

## 2014-10-29 ENCOUNTER — Encounter: Payer: Self-pay | Admitting: *Deleted

## 2014-10-29 NOTE — Progress Notes (Signed)
This RN received a faxed application from [employer] [facility]  [facility]. Paperwork placed in Managed Cares box.

## 2014-10-30 ENCOUNTER — Encounter: Payer: Self-pay | Admitting: Oncology

## 2014-10-30 NOTE — Progress Notes (Signed)
I placed j&J app for poss asst with zytiga on desk of nurse for dr. Alen Blew.

## 2014-11-03 ENCOUNTER — Ambulatory Visit
Admission: RE | Admit: 2014-11-03 | Discharge: 2014-11-03 | Disposition: A | Discharge status: Home / Self Care | Payer: Medicare Other | Source: Ambulatory Visit | Attending: Neurology | Admitting: Neurology

## 2014-11-03 ENCOUNTER — Telehealth: Payer: Self-pay | Admitting: Neurology

## 2014-11-03 DIAGNOSIS — R413 Other amnesia: Secondary | ICD-10-CM | POA: Diagnosis not present

## 2014-11-03 DIAGNOSIS — E538 Deficiency of other specified B group vitamins: Secondary | ICD-10-CM

## 2014-11-03 DIAGNOSIS — R0989 Other specified symptoms and signs involving the circulatory and respiratory systems: Secondary | ICD-10-CM

## 2014-11-03 NOTE — Telephone Encounter (Signed)
I called the patient. The CT scan head shows minimal small vessel disease. there appears to be some cortical atrophy as well. The carotid Doppler study does not show high-grade stenosis on either side. If the patient wishes to go on medications for memory, he is to contact our office.   CT head 11/03/2014:  IMPRESSION: This is an abnormal CT scan of the head showing the following: 1. Widespread hypodense signal changes most consistent with chronic age-related microvascular ischemic changes.  2. Minimal overall and mild mesial temporal cortical atrophy. The extent may be within normal limits for age. 3. There are no acute findings.   Carotid Doppler study 11/03/2014:  IMPRESSION: 1. No significant plaque or evidence of stenosis in either internal carotid artery. 2. Mild heterogeneous atherosclerotic plaque in both common carotid bifurcations without evidence of significant stenosis. 3. Vertebral arteries are patent with normal antegrade flow. 4. Irregular pulse is noted bilaterally. Does the patient have a clinical history of atrial fibrillation or other cardiac arrhythmia? 5. Tortuous internal carotid arteries.

## 2014-11-05 ENCOUNTER — Telehealth: Payer: Self-pay | Admitting: Neurology

## 2014-11-05 NOTE — Telephone Encounter (Signed)
Pt called and would like to speak to Dr. Jannifer Franklin , has question about driving after surgery. Please call and advise [phone]

## 2014-11-05 NOTE — Telephone Encounter (Signed)
I called the patient and advised that he call his surgeon for questions about driving after surgery. Dr. Jannifer Franklin' note states that driving needs to be scrutinized closely but does not state he cannot drive. Patient stated he would call his surgeon.

## 2014-12-05 NOTE — Progress Notes (Signed)
This encounter was created in error - please disregard.

## 2014-12-12 ENCOUNTER — Encounter: Payer: Self-pay | Admitting: Oncology

## 2014-12-12 NOTE — Progress Notes (Signed)
I mailed copy of application that the patient has to fill out to him and made note if he still wants asst with zytiga please get back to me.

## 2015-01-08 ENCOUNTER — Telehealth: Payer: Self-pay | Admitting: Oncology

## 2015-01-08 NOTE — Telephone Encounter (Signed)
adv pt if there was a sense of urgency he could spk to triage-pt stated no just missed several appts and needed to come see Dr Alen Blew soon-adv no cancellation list kept but could call and check if any openings sooner by calling [phone]

## 2015-01-08 NOTE — Telephone Encounter (Signed)
pt walked in to sch appt w/Dr Alen Blew gave pt 1st avaiable

## 2015-01-16 ENCOUNTER — Emergency Department (HOSPITAL_COMMUNITY): Payer: Medicare Other

## 2015-01-16 ENCOUNTER — Encounter (HOSPITAL_COMMUNITY): Payer: Self-pay

## 2015-01-16 ENCOUNTER — Emergency Department (HOSPITAL_COMMUNITY)
Admission: EM | Admit: 2015-01-16 | Discharge: 2015-01-16 | Disposition: A | Discharge status: Home / Self Care | Payer: Medicare Other | Attending: Emergency Medicine | Admitting: Emergency Medicine

## 2015-01-16 DIAGNOSIS — S92351A Displaced fracture of fifth metatarsal bone, right foot, initial encounter for closed fracture: Secondary | ICD-10-CM | POA: Insufficient documentation

## 2015-01-16 DIAGNOSIS — I10 Essential (primary) hypertension: Secondary | ICD-10-CM | POA: Insufficient documentation

## 2015-01-16 DIAGNOSIS — Y998 Other external cause status: Secondary | ICD-10-CM | POA: Insufficient documentation

## 2015-01-16 DIAGNOSIS — W1839XA Other fall on same level, initial encounter: Secondary | ICD-10-CM | POA: Insufficient documentation

## 2015-01-16 DIAGNOSIS — I2510 Atherosclerotic heart disease of native coronary artery without angina pectoris: Secondary | ICD-10-CM | POA: Diagnosis not present

## 2015-01-16 DIAGNOSIS — R011 Cardiac murmur, unspecified: Secondary | ICD-10-CM | POA: Diagnosis not present

## 2015-01-16 DIAGNOSIS — Y9289 Other specified places as the place of occurrence of the external cause: Secondary | ICD-10-CM | POA: Diagnosis not present

## 2015-01-16 DIAGNOSIS — Z7982 Long term (current) use of aspirin: Secondary | ICD-10-CM | POA: Diagnosis not present

## 2015-01-16 DIAGNOSIS — E785 Hyperlipidemia, unspecified: Secondary | ICD-10-CM | POA: Insufficient documentation

## 2015-01-16 DIAGNOSIS — Z79899 Other long term (current) drug therapy: Secondary | ICD-10-CM | POA: Diagnosis not present

## 2015-01-16 DIAGNOSIS — E0590 Thyrotoxicosis, unspecified without thyrotoxic crisis or storm: Secondary | ICD-10-CM | POA: Insufficient documentation

## 2015-01-16 DIAGNOSIS — Z87891 Personal history of nicotine dependence: Secondary | ICD-10-CM | POA: Insufficient documentation

## 2015-01-16 DIAGNOSIS — F419 Anxiety disorder, unspecified: Secondary | ICD-10-CM | POA: Diagnosis not present

## 2015-01-16 DIAGNOSIS — E119 Type 2 diabetes mellitus without complications: Secondary | ICD-10-CM | POA: Diagnosis not present

## 2015-01-16 DIAGNOSIS — Z8546 Personal history of malignant neoplasm of prostate: Secondary | ICD-10-CM | POA: Diagnosis not present

## 2015-01-16 DIAGNOSIS — S92344A Nondisplaced fracture of fourth metatarsal bone, right foot, initial encounter for closed fracture: Secondary | ICD-10-CM | POA: Diagnosis not present

## 2015-01-16 DIAGNOSIS — S92301A Fracture of unspecified metatarsal bone(s), right foot, initial encounter for closed fracture: Secondary | ICD-10-CM

## 2015-01-16 DIAGNOSIS — Y9389 Activity, other specified: Secondary | ICD-10-CM | POA: Insufficient documentation

## 2015-01-16 DIAGNOSIS — S99921A Unspecified injury of right foot, initial encounter: Secondary | ICD-10-CM | POA: Diagnosis present

## 2015-01-16 LAB — CBC WITH DIFFERENTIAL/PLATELET
Basophils Absolute: 0 10*3/uL (ref 0.0–0.1)
Basophils Relative: 0 %
EOS PCT: 3 %
Eosinophils Absolute: 0.1 10*3/uL (ref 0.0–0.7)
HCT: 36.7 % — ABNORMAL LOW (ref 39.0–52.0)
HEMOGLOBIN: 12.3 g/dL — AB (ref 13.0–17.0)
LYMPHS ABS: 1.5 10*3/uL (ref 0.7–4.0)
Lymphocytes Relative: 34 %
MCH: 29.6 pg (ref 26.0–34.0)
MCHC: 33.5 g/dL (ref 30.0–36.0)
MCV: 88.4 fL (ref 78.0–100.0)
MONOS PCT: 11 %
Monocytes Absolute: 0.5 10*3/uL (ref 0.1–1.0)
Neutro Abs: 2.3 10*3/uL (ref 1.7–7.7)
Neutrophils Relative %: 52 %
Platelets: 112 10*3/uL — ABNORMAL LOW (ref 150–400)
RBC: 4.15 MIL/uL — ABNORMAL LOW (ref 4.22–5.81)
RDW: 14.7 % (ref 11.5–15.5)
WBC: 4.5 10*3/uL (ref 4.0–10.5)

## 2015-01-16 LAB — BASIC METABOLIC PANEL
Anion gap: 9 (ref 5–15)
BUN: 22 mg/dL — AB (ref 6–20)
CALCIUM: 9.1 mg/dL (ref 8.9–10.3)
CHLORIDE: 105 mmol/L (ref 101–111)
CO2: 26 mmol/L (ref 22–32)
CREATININE: 1.28 mg/dL — AB (ref 0.61–1.24)
GFR calc Af Amer: 57 mL/min — ABNORMAL LOW (ref >60)
GFR calc non Af Amer: 49 mL/min — ABNORMAL LOW (ref >60)
Glucose, Bld: 71 mg/dL (ref 65–99)
Potassium: 3.2 mmol/L — ABNORMAL LOW (ref 3.5–5.1)
SODIUM: 140 mmol/L (ref 135–145)

## 2015-01-16 LAB — TSH: TSH: 1.415 u[IU]/mL (ref 0.350–4.500)

## 2015-01-16 LAB — MAGNESIUM: MAGNESIUM: 2.3 mg/dL (ref 1.7–2.4)

## 2015-01-16 LAB — T4, FREE: Free T4: 0.72 ng/dL (ref 0.61–1.12)

## 2015-01-16 LAB — PHOSPHORUS: PHOSPHORUS: 2.9 mg/dL (ref 2.5–4.6)

## 2015-01-16 MED ORDER — POTASSIUM CHLORIDE CRYS ER 20 MEQ PO TBCR
40.0000 meq | EXTENDED_RELEASE_TABLET | Freq: Once | ORAL | Status: AC
  Administered 2015-01-16: 40 meq via ORAL
  Filled 2015-01-16: qty 2

## 2015-01-16 NOTE — Discharge Instructions (Signed)
Please call your primary care doctor for follow-up on Monday. He is also been given referral to orthopedic surgery. Please call the above number to set up follow-up appointment in one week. Return without fail for worsening symptoms, including worsening pain, color changes to your toes, new numbness or weakness, or any other symptoms concerning to you.  Metatarsal Fracture A metatarsal fracture is a break in a metatarsal bone. Metatarsal bones connect your toe bones to your ankle bones. CAUSES This type of fracture may be caused by:  A sudden twisting of your foot.  A fall onto your foot.  Overuse or repetitive exercise. RISK FACTORS This condition is more likely to develop in people who:  Play contact sports.  Have a bone disease.  Have a low calcium level. SYMPTOMS Symptoms of this condition include:  Pain that is worse when walking or standing.  Pain when pressing on the foot or moving the toes.  Swelling.  Bruising on the top or bottom of the foot.  A foot that appears shorter than the other one. DIAGNOSIS This condition is diagnosed with a physical exam. You may also have imaging tests, such as:  X-rays.  A CT scan.  MRI. TREATMENT Treatment for this condition depends on its severity and whether a bone has moved out of place. Treatment may involve:  Rest.  Wearing foot support such as a cast, splint, or boot for several weeks.  Using crutches.  Surgery to move bones back into the right position. Surgery is usually needed if there are many pieces of broken bone or bones that are very out of place (displaced fracture).  Physical therapy. This may be needed to help you regain full movement and strength in your foot. You will need to return to your health care provider to have X-rays taken until your bones heal. Your health care provider will look at the X-rays to make sure that your foot is healing well. HOME CARE INSTRUCTIONS  If You Have a Cast:  Do not  stick anything inside the cast to scratch your skin. Doing that increases your risk of infection.  Check the skin around the cast every day. Report any concerns to your health care provider. You may put lotion on dry skin around the edges of the cast. Do not apply lotion to the skin underneath the cast.  Keep the cast clean and dry. If You Have a Splint or a Supportive Boot:  Wear it as directed by your health care provider. Remove it only as directed by your health care provider.  Loosen it if your toes become numb and tingle, or if they turn cold and blue.  Keep it clean and dry. Bathing  Do not take baths, swim, or use a hot tub until your health care provider approves. Ask your health care provider if you can take showers. You may only be allowed to take sponge baths for bathing.  If your health care provider approves bathing and showering, cover the cast or splint with a watertight plastic bag to protect it from water. Do not let the cast or splint get wet. Managing Pain, Stiffness, and Swelling  If directed, apply ice to the injured area (if you have a splint, not a cast).  Put ice in a plastic bag.  Place a towel between your skin and the bag.  Leave the ice on for 20 minutes, 2-3 times per day.  Move your toes often to avoid stiffness and to lessen swelling.  Raise (elevate)  the injured area above the level of your heart while you are sitting or lying down. Driving  Do not drive or operate heavy machinery while taking pain medicine.  Do not drive while wearing foot support on a foot that you use for driving. Activity  Return to your normal activities as directed by your health care provider. Ask your health care provider what activities are safe for you.  Perform exercises as directed by your health care provider or physical therapist. Safety  Do not use the injured foot to support your body weight until your health care provider says that you can. Use crutches as  directed by your health care provider. General Instructions  Do not put pressure on any part of the cast or splint until it is fully hardened. This may take several hours.  Do not use any tobacco products, including cigarettes, chewing tobacco, or e-cigarettes. Tobacco can delay bone healing. If you need help quitting, ask your health care provider.  Take medicines only as directed by your health care provider.  Keep all follow-up visits as directed by your health care provider. This is important. SEEK MEDICAL CARE IF:  You have a fever.  Your cast, splint, or boot is too loose or too tight.  Your cast, splint, or boot is damaged.  Your pain medicine is not helping.  You have pain, tingling, or numbness in your foot that is not going away. SEEK IMMEDIATE MEDICAL CARE IF:  You have severe pain.  You have tingling or numbness in your foot that is getting worse.  Your foot feels cold or becomes numb.  Your foot changes color.   This information is not intended to replace advice given to you by your health care provider. Make sure you discuss any questions you have with your health care provider.   Document Released: 12/18/2001 Document Revised: 08/12/2014 Document Reviewed: 01/22/2014 Elsevier Interactive Patient Education [employer].

## 2015-01-16 NOTE — Progress Notes (Signed)
Orthopedic Tech Progress Note Patient Details:  Shane [name] [birth year] [id number]  Ortho Devices Type of Ortho Device: CAM walker Ortho Device/Splint Location: rle Ortho Device/Splint Interventions: Application   Shane [name] 01/16/2015, 2:17 PM

## 2015-01-16 NOTE — ED Provider Notes (Signed)
CSN: [id number]     Arrival date & time 01/16/15  1148 History   First MD Initiated Contact with Patient 01/16/15 1301     Chief Complaint  Patient presents with  . Foot Pain     (Consider location/radiation/quality/duration/timing/severity/associated sxs/prior Treatment) HPI 81 year old who presents with right foot pain after fall. Reports that he was helping his wife, who is currently ill, ambulate. She tripped and took him down with her. Did not have headstrike or LOC. Injured his right foot and having pain with ambulation. Denies headache, nausea, vomiting, numbness, weakness, vision changes, speech changes, or confusion. No neck pain, back pain, chest pain, or abdominal pain.   Past Medical History  Diagnosis Date  . Prostate CA [facility])     prostate ca dx 20 yrs ago  . Hypertension   . Hyperlipidemia   . Hyperthyroidism   . Anxiety   . Dysrhythmia   . Heart murmur   . Constipation   . GERD (gastroesophageal reflux disease)   . Diabetes mellitus     Type II  . CAD (coronary artery disease)     08/25/03 cath [facility]): NL coronaries, EF 70-75%   . Memory difficulties 10/10/2014   Past Surgical History  Procedure Laterality Date  . Appendectomy    . Penile prosthesis implant    . Hernia repair Left   . Colonoscopy    . Inguinal hernia repair  07/14/2014    with mesh  . Prostatectomy    . Inguinal hernia repair Right 07/14/2014    Procedure: OPEN REPAIR RIGHT INGUINAL HERNIA ;  Surgeon: Fanny Skates, MD;  Location: [place];  Service: General;  Laterality: Right;  . Insertion of mesh Right 07/14/2014    Procedure: INSERTION OF MESH;  Surgeon: Fanny Skates, MD;  Location: [facility];  Service: General;  Laterality: Right;  . Laparotomy N/A 08/24/2014    Procedure: EXPLORATORY LAPAROTOMY, RESECTION SIGMOID COLON, COLOSTOMY;  Surgeon: Jackolyn Confer, MD;  Location: [facility];  Service: General;  Laterality: N/A;  . Colostomy     Family History  Problem Relation Age of Onset  . CVA Mother    . Alcohol abuse Brother   . Dementia Sister    Social History  Substance Use Topics  . Smoking status: Former Smoker -- 35 years  . Smokeless tobacco: Never Used     Comment: 07/07/14  . Alcohol Use: No    Review of Systems 10/14 systems reviewed and are negative other than those stated in the HPI    Allergies  Lisinopril  Home Medications   Prior to Admission medications   Medication Sig Start Date End Date Taking? Authorizing Provider  aspirin EC 81 MG tablet Take 81 mg by mouth at bedtime.    Historical Provider, MD  atorvastatin (LIPITOR) 20 MG tablet Take 20 mg by mouth at bedtime.     Historical Provider, MD  calcium carbonate (TUMS EX) 750 MG chewable tablet Chew 1 tablet by mouth daily.    Historical Provider, MD  Cholecalciferol (VITAMIN D PO) Take 1 tablet by mouth daily.    Historical Provider, MD  [employer] SENNA PLUS 8.6-50 MG per tablet TAKE 2 TABLETS BY MOUTH 2 (TWO) TIMES DAILY. 06/21/14   Wyatt Portela, MD  erythromycin ophthalmic ointment Place into the left eye 4 (four) times daily. Apply 1/2 inch to left eye 4 times a day for 5 days. Patient taking differently: Place 1 application into the right eye daily as needed (irritation).  09/17/12   Maryanna Shape, NP  furosemide (LASIX) 40 MG tablet Take 40 mg by mouth daily.    Historical Provider, MD  GARLIC PO Take 1 tablet by mouth daily.    Historical Provider, MD  gemfibrozil (LOPID) 600 MG tablet Take 600 mg by mouth 2 (two) times daily before a meal.    Historical Provider, MD  glipiZIDE (GLUCOTROL XL) 2.5 MG 24 hr tablet Take 2.5 mg by mouth daily. 06/24/14   Historical Provider, MD  HYDROcodone-acetaminophen (NORCO) 5-325 MG per tablet Take 1-2 tablets by mouth every 6 (six) hours as needed for moderate pain or severe pain. 08/30/14   Stark Klein, MD  Lactulose 20 GM/30ML SOLN Take 30 mLs (20 g total) by mouth 2 (two) times daily as needed (For constipation). Patient taking differently: Take 30 mLs by mouth daily  as needed (For constipation).  10/16/13   Maryanna Shape, NP  losartan-hydrochlorothiazide (HYZAAR) 100-25 MG per tablet Take 0.5 tablets by mouth daily.     Historical Provider, MD  metoCLOPramide (REGLAN) 5 MG tablet Take 5 mg by mouth 4 (four) times daily -  before meals and at bedtime.  05/27/14   Historical Provider, MD  omeprazole (PRILOSEC) 20 MG capsule Take 20 mg by mouth daily before breakfast.     Historical Provider, MD  OVER THE COUNTER MEDICATION Place 1 drop into the left eye at bedtime as needed (irritation). Over the counter eye drops    Historical Provider, MD  potassium chloride (K-DUR) 10 MEQ tablet Take 10 mEq by mouth daily.    Historical Provider, MD  predniSONE (DELTASONE) 5 MG tablet TAKE 1 TABLET BY MOUTH DAILY 07/23/14   Wyatt Portela, MD  risedronate (ACTONEL) 35 MG tablet Take 35 mg by mouth every 7 (seven) days. On Mondays 02/18/14   Historical Provider, MD  sulindac (CLINORIL) 200 MG tablet Take 200 mg by mouth daily.     Historical Provider, MD  vitamin C (ASCORBIC ACID) 500 MG tablet Take 500 mg by mouth daily.    Historical Provider, MD  ZYTIGA 250 MG tablet TAKE 4 TABLETS BY MOUTH DAILY ON AN EMPTY STOMACH 1 HOUR BEFORE OR 2 HOURS AFTER A MEAL 10/01/14   Wyatt Portela, MD  ZYTIGA 250 MG tablet TAKE 4 TABLETS BY MOUTH DAILY ON AN EMPTY STOMACH 1 HOUR BEFORE OR 2 HOURS AFTER A MEAL 10/01/14   Wyatt Portela, MD   BP 166/57 mmHg  Pulse 95  Temp(Src) 98.1 F (36.7 C) (Oral)  Resp 17  Ht 5\' 7"  (1.702 m)  Wt 140 lb (63.504 kg)  BMI 21.92 kg/m2  SpO2 100% Physical Exam Physical Exam  Nursing note and vitals reviewed. Constitutional: Well developed, well nourished, non-toxic, and in no acute distress Head: Normocephalic and atraumatic.  Mouth/Throat: Oropharynx is clear and moist.  Neck: Normal range of motion. Neck supple. No cervical spine tenderness. Cardiovascular: Normal rate and regular rhythm.  +2 DP pulse in RLE. Pulmonary/Chest: Effort normal and breath  sounds normal.  Abdominal: Soft. There is no tenderness. There is no rebound and no guarding.  Musculoskeletal: Normal range of motion.  Neurological: Alert, no facial droop, fluent speech, moves all extremities symmetrically, in tact sensation to light touch in bilateral lower extremities. Skin: Skin is warm and dry.  Psychiatric: Cooperative  ED Course  Procedures (including critical care time) Labs Review Labs Reviewed  CBC WITH DIFFERENTIAL/PLATELET - Abnormal; Notable for the following:    RBC 4.15 (*)  Hemoglobin 12.3 (*)    HCT 36.7 (*)    Platelets 112 (*)    All other components within normal limits  BASIC METABOLIC PANEL - Abnormal; Notable for the following:    Potassium 3.2 (*)    BUN 22 (*)    Creatinine, Ser 1.28 (*)    GFR calc non Af Amer 49 (*)    GFR calc Af Amer 57 (*)    All other components within normal limits  MAGNESIUM  PHOSPHORUS  TSH  T4, FREE    Imaging Review Dg Foot Complete Right  01/16/2015   CLINICAL DATA:  Pain at lateral side of RIGHT foot, wife fell on him  EXAM: RIGHT FOOT COMPLETE - 3+ VIEW  COMPARISON:  None  FINDINGS: Bones appear demineralized.  Comminuted displaced oblique fracture of the mid to distal fifth metatarsal shaft.  Fracture fragments are displaced medially and question dorsally.  Nondisplaced fracture at base of fourth metatarsal head.  Joint spaces preserved.  No additional fracture, dislocation, or bone destruction.  IMPRESSION: Displaced oblique RIGHT fifth metatarsal diaphyseal fracture.  Nondisplaced fracture at base of fourth metatarsal head.   Electronically Signed   By: Lavonia Oluwadarasimi Favor M.D.   On: 01/16/2015 12:54   I have personally reviewed and evaluated these images and lab results as part of my medical decision-making.   EKG Interpretation   Date/Time:  Friday January 16 2015 12:21:33 EDT Ventricular Rate:  64 PR Interval:  144 QRS Duration: 86 QT Interval:  406 QTC Calculation: 418 R Axis:   -50 Text  Interpretation:  Sinus rhythm with Premature atrial complexes Left  anterior fascicular block Left ventricular hypertrophy with repolarization  abnormality Cannot rule out Septal infarct , age undetermined Abnormal ECG  No significant change since last tracing Confirmed by Dewanda Fennema MD, Anjuli Gemmill [phone])  on 01/16/2015 1:05:25 PM      MDM   Final diagnoses:  Multiple closed fractures of metatarsal bone of right foot, initial encounter    81 year old male who presents with right foot pain after mechanical fall. Extremity neurovascularly in tact. XR showing new 4th and 5th metatarsal fracture. Placed in CAM walking boot and referral given for orthopedic surgery. Pain well controlled with tylenol at home.   In triage, patient reported bradycardic 30-40s by palpation. No EKG perofrmed at that time. HR 60's in ED, with EKG showing sinus rhythm and unchanged from prior. Patient reports being asymptomatic. Does report episodes of being lightheaded randomly over past year. Episodes infrequent, not associated with exertion, chest pain, dyspnea, or fatigue. No symptoms now. No symptoms when noted to be bradycardic in triage. Blood work without major electrolyte or metabolic derangements. Mild hypokalemia 3.2 and given 40 mEQ oral potassium. Discussed with Dr. Doylene Canard, who will see patient on Monday for further work-up of this.  Does not feel that he requires admission for this currently.   Strict return and follow-up instructions reviewed. He expressed understanding of all discharge instructions and felt comfortable with the plan of care.     Forde Dandy, MD 01/16/15 [phone]

## 2015-01-16 NOTE — ED Notes (Addendum)
Pt presents with pain to top of R foot, reports his wife fell last night, landing on top of his foot.  Pt able to ambulate. During triage process, pt's pulse noted in 30s with radial palpated in 40s.

## 2015-02-02 ENCOUNTER — Telehealth (HOSPITAL_BASED_OUTPATIENT_CLINIC_OR_DEPARTMENT_OTHER): Payer: Self-pay | Admitting: Emergency Medicine

## 2015-02-04 ENCOUNTER — Telehealth: Payer: Self-pay | Admitting: Oncology

## 2015-02-04 ENCOUNTER — Ambulatory Visit (HOSPITAL_BASED_OUTPATIENT_CLINIC_OR_DEPARTMENT_OTHER): Payer: Medicare Other | Admitting: Oncology

## 2015-02-04 ENCOUNTER — Other Ambulatory Visit (HOSPITAL_BASED_OUTPATIENT_CLINIC_OR_DEPARTMENT_OTHER): Payer: Medicare Other

## 2015-02-04 VITALS — BP 144/53 | HR 95 | Temp 98.2°F | Resp 18 | Ht 67.0 in | Wt 141.8 lb

## 2015-02-04 DIAGNOSIS — C61 Malignant neoplasm of prostate: Secondary | ICD-10-CM | POA: Diagnosis present

## 2015-02-04 DIAGNOSIS — E291 Testicular hypofunction: Secondary | ICD-10-CM | POA: Diagnosis not present

## 2015-02-04 DIAGNOSIS — I10 Essential (primary) hypertension: Secondary | ICD-10-CM | POA: Diagnosis not present

## 2015-02-04 DIAGNOSIS — E876 Hypokalemia: Secondary | ICD-10-CM

## 2015-02-04 DIAGNOSIS — K5900 Constipation, unspecified: Secondary | ICD-10-CM | POA: Diagnosis not present

## 2015-02-04 LAB — CBC WITH DIFFERENTIAL/PLATELET
BASO%: 0.2 % (ref 0.0–2.0)
BASOS ABS: 0 10*3/uL (ref 0.0–0.1)
EOS%: 0.2 % (ref 0.0–7.0)
Eosinophils Absolute: 0 10*3/uL (ref 0.0–0.5)
HEMATOCRIT: 36.8 % — AB (ref 38.4–49.9)
HEMOGLOBIN: 11.8 g/dL — AB (ref 13.0–17.1)
LYMPH#: 1.2 10*3/uL (ref 0.9–3.3)
LYMPH%: 24.5 % (ref 14.0–49.0)
MCH: 28.7 pg (ref 27.2–33.4)
MCHC: 32.1 g/dL (ref 32.0–36.0)
MCV: 89.5 fL (ref 79.3–98.0)
MONO#: 0.6 10*3/uL (ref 0.1–0.9)
MONO%: 12.2 % (ref 0.0–14.0)
NEUT#: 3.1 10*3/uL (ref 1.5–6.5)
NEUT%: 62.9 % (ref 39.0–75.0)
Platelets: 129 10*3/uL — ABNORMAL LOW (ref 140–400)
RBC: 4.11 10*6/uL — ABNORMAL LOW (ref 4.20–5.82)
RDW: 15.7 % — AB (ref 11.0–14.6)
WBC: 4.9 10*3/uL (ref 4.0–10.3)

## 2015-02-04 LAB — COMPREHENSIVE METABOLIC PANEL (CC13)
ALBUMIN: 3.4 g/dL — AB (ref 3.5–5.0)
ALT: 14 U/L (ref 0–55)
AST: 17 U/L (ref 5–34)
Alkaline Phosphatase: 113 U/L (ref 40–150)
Anion Gap: 9 mEq/L (ref 3–11)
BUN: 22.3 mg/dL (ref 7.0–26.0)
CO2: 29 mEq/L (ref 22–29)
CREATININE: 1.5 mg/dL — AB (ref 0.7–1.3)
Calcium: 9.1 mg/dL (ref 8.4–10.4)
Chloride: 105 mEq/L (ref 98–109)
EGFR: 50 mL/min/{1.73_m2} — ABNORMAL LOW (ref >90)
GLUCOSE: 130 mg/dL (ref 70–140)
Potassium: 3.3 mEq/L — ABNORMAL LOW (ref 3.5–5.1)
SODIUM: 143 meq/L (ref 136–145)
Total Bilirubin: 0.9 mg/dL (ref 0.20–1.20)
Total Protein: 7 g/dL (ref 6.4–8.3)

## 2015-02-04 NOTE — Telephone Encounter (Signed)
Gave adn printed appt sched adn avs for pt for DEc

## 2015-02-04 NOTE — Progress Notes (Signed)
Hematology and Oncology Follow Up Visit  Shane [name] [id number] 12/15/1926 81 y.o. 02/04/2015 8:49 AM   Principle Diagnosis:This is an 81 year old gentleman with prostate cancer diagnosed in 22.  He currently has metastatic disease that is asymptomatic.  Prior Therapy: 1. Status post prostatectomy and lymphadenectomy.  He had involvement of the seminal vesicles. 2. Patient treated with adjuvant radiation therapy. 3. Patient developed recurrent disease treated with Lupron and subsequently with Lupron and Casodex and Casodex withdrawal. 4. Patient treated with second-line hormonal manipulation, including ketoconazole, prednisone and subsequently had relapse with PSA up to 47. 5. Received Provenge immunotherapy.  He had treated with 3 Provenge infusions, the last of which was given on 12/17/2010.    Current therapy: Currently on hormonal deprivation with Lupron30 mg as needed if his testosterone is more than 20.  Zytiga 1000 mg daily with Prednisone beginning in April 2013.  He continues to have excellent response with PSA.  Interim History:  Shane [name] presents today for a followup visit by himself. Since the last visit, he developed a colonic perforation back in May 2016 and required urgent operation. He had a an emergency exploratory laparotomy sigmoid colectomy and colostomy in May 2016. He had a reasonable recovery but required rehabilitation stay and now he is recovering at this time. He missed his future appointments as he is recovering from his current treatment. He is also attending to his sick wife who is currently in the hospital. Before she was hospitalized she fell and he sustained a fracture in his foot.  He  continues on Zytiga without any new side effects. He does have occasional constipation and MiraLAX help with his symptoms. He does not report any abdominal pain, nausea, or vomiting. He continues to be functional and enjoys excellent quality of life. His appetite is  improving and is gaining some of his weight back. He lost close to 25 pounds after his acute illness.   He did not report chest pain or difficulty breathing. Did not report any genitourinary complaints.  Performance status and activity level remain excellent at this time.  No recent hospitalizations or illnesses. No bone pain and actually his energy is better. Is not reporting any lower extremity edema or decline in his performance status.  He has not reported any constitutional symptoms or any complications related to prednisone. He has not reported any new complications or any changes in his quality of life. Rest of his review of systems unremarkable.  Medications: I have reviewed the patient's current medications.  Current Outpatient Prescriptions  Medication Sig Dispense Refill  . aspirin EC 81 MG tablet Take 81 mg by mouth at bedtime.    Marland Kitchen atorvastatin (LIPITOR) 20 MG tablet Take 20 mg by mouth at bedtime.     . calcium carbonate (TUMS EX) 750 MG chewable tablet Chew 1 tablet by mouth daily.    . Cholecalciferol (VITAMIN D PO) Take 1 tablet by mouth daily.    . CVS SENNA PLUS 8.6-50 MG per tablet TAKE 2 TABLETS BY MOUTH 2 (TWO) TIMES DAILY. 180 tablet 2  . erythromycin ophthalmic ointment Place into the left eye 4 (four) times daily. Apply 1/2 inch to left eye 4 times a day for 5 days. (Patient taking differently: Place 1 application into the right eye daily as needed (irritation). ) 3.5 g 0  . furosemide (LASIX) 40 MG tablet Take 40 mg by mouth daily.    Marland Kitchen GARLIC PO Take 1 tablet by mouth daily.    Marland Kitchen gemfibrozil (  LOPID) 600 MG tablet Take 600 mg by mouth 2 (two) times daily before a meal.    . glipiZIDE (GLUCOTROL XL) 2.5 MG 24 hr tablet Take 2.5 mg by mouth daily.  6  . HYDROcodone-acetaminophen (NORCO) 5-325 MG per tablet Take 1-2 tablets by mouth every 6 (six) hours as needed for moderate pain or severe pain. 30 tablet 0  . Lactulose 20 GM/30ML SOLN Take 30 mLs (20 g total) by mouth 2  (two) times daily as needed (For constipation). (Patient taking differently: Take 30 mLs by mouth daily as needed (For constipation). ) 500 mL 2  . losartan-hydrochlorothiazide (HYZAAR) 100-25 MG per tablet Take 0.5 tablets by mouth daily.     . metoCLOPramide (REGLAN) 5 MG tablet Take 5 mg by mouth 4 (four) times daily -  before meals and at bedtime.   3  . omeprazole (PRILOSEC) 20 MG capsule Take 20 mg by mouth daily before breakfast.     . OVER THE COUNTER MEDICATION Place 1 drop into the left eye at bedtime as needed (irritation). Over the counter eye drops    . potassium chloride (K-DUR) 10 MEQ tablet Take 10 mEq by mouth daily.    . predniSONE (DELTASONE) 5 MG tablet TAKE 1 TABLET BY MOUTH DAILY 90 tablet 0  . risedronate (ACTONEL) 35 MG tablet Take 35 mg by mouth every 7 (seven) days. On Mondays  5  . sulindac (CLINORIL) 200 MG tablet Take 200 mg by mouth daily.     . vitamin C (ASCORBIC ACID) 500 MG tablet Take 500 mg by mouth daily.    Marland Kitchen ZYTIGA 250 MG tablet TAKE 4 TABLETS BY MOUTH DAILY ON AN EMPTY STOMACH 1 HOUR BEFORE OR 2 HOURS AFTER A MEAL 120 tablet 1  . ZYTIGA 250 MG tablet TAKE 4 TABLETS BY MOUTH DAILY ON AN EMPTY STOMACH 1 HOUR BEFORE OR 2 HOURS AFTER A MEAL 120 tablet 1   No current facility-administered medications for this visit.     Allergies:  Allergies  Allergen Reactions  . Lisinopril     cough    Past Medical History, Surgical history, Social history, and Family History were reviewed and updated.    Physical Exam: Blood pressure 144/53, pulse 95, temperature 98.2 F (36.8 C), temperature source Oral, resp. rate 18, height 5\' 7"  (1.702 m), weight 141 lb 12.8 oz (64.32 kg), SpO2 100 %. ECOG: 1 General appearance: alert elderly gentleman did not appear in any distress. Head: Normocephalic, without obvious abnormality no oral ulcers or lesions. Neck: no adenopathy Lymph nodes: Cervical, supraclavicular, and axillary nodes normal. Heart:regular rate and  rhythm, S1, S2 normal, no murmur, click, rub or gallop Lung:chest clear, no wheezing, rales, normal symmetric air entry Abdomen: soft, non-tender, without masses or organomegaly no shifting dullness or ascites. EXT:no erythema, induration, or nodules Neuro: no deficits noted.   Lab Results: Lab Results  Component Value Date   WBC 4.9 02/04/2015   HGB 11.8* 02/04/2015   HCT 36.8* 02/04/2015   MCV 89.5 02/04/2015   PLT 129* 02/04/2015       Results for DARRIOUS, YOUMAN (MRN [id number]) as of 02/04/2015 08:51  Ref. Range 04/30/2014 13:51 06/24/2014 12:21  PSA Latest Ref Range: <=4.00 ng/mL 0.40 0.56     Results for GARED, GILLIE (MRN [id number]) as of 02/04/2015 08:51  Ref. Range 06/27/2012 11:21 04/30/2014 13:51  Testosterone Latest Ref Range: 300-890 ng/dL <10 (L) <10 (L)      Impression and Plan: This is a pleasant  81 year old gentleman with the following issues: 1. Castration-resistant prostate cancer.  He is status post Provenge immunotherapy. He is currently on Zytiga and he is tolerating it well. His PSA last checked in March 2016 and had been under excellent control. He was ill and missed his last few appointments and his PSA had not been checked since that time. The plan is to keep him on the same dose and schedule of Zytiga pending his PSA results. I will also check his testosterone level make sure he is adequately castrate. As long as his PSA under control, we'll keep him on this medication and use a different salvage agent upon symptomatic progression. 2. Androgen deprivation: His last testosterone on 04/2014 indicated a castrate level. I will check his testosterone within next visit. 3. Hypertension: His blood pressure is under control and medications have been adjusted. 4. Hypokalemia: Potassium level is pending from today and we'll replace as needed. 5. Constipation: doing better from this stand point. MiraLAX seems to have helped his symptoms. 6. Follow-up: In 8  weeks.  Francisco Ostrovsky 10/26/20168:49 AM

## 2015-02-05 LAB — PSA: PSA: 3.94 ng/mL (ref <=4.00)

## 2015-02-09 ENCOUNTER — Other Ambulatory Visit: Payer: Self-pay | Admitting: Oncology

## 2015-02-10 ENCOUNTER — Other Ambulatory Visit: Payer: Self-pay | Admitting: *Deleted

## 2015-02-10 DIAGNOSIS — C61 Malignant neoplasm of prostate: Secondary | ICD-10-CM

## 2015-02-10 MED ORDER — ABIRATERONE ACETATE 250 MG PO TABS: ORAL_TABLET | ORAL | Status: DC

## 2015-02-11 ENCOUNTER — Encounter: Payer: Self-pay | Admitting: Adult Health

## 2015-02-11 ENCOUNTER — Ambulatory Visit (INDEPENDENT_AMBULATORY_CARE_PROVIDER_SITE_OTHER): Payer: Medicare Other | Admitting: Adult Health

## 2015-02-11 VITALS — BP 134/66 | HR 57 | Ht 67.0 in | Wt 148.0 lb

## 2015-02-11 DIAGNOSIS — R413 Other amnesia: Secondary | ICD-10-CM | POA: Diagnosis not present

## 2015-02-11 NOTE — Patient Instructions (Signed)
Memory score has remained stable.  We can consider Namenda for the memory Call if you decide to start this medication   Memantine Tablets What is this medicine? MEMANTINE (MEM an teen) is used to treat dementia caused by Alzheimer's disease. This medicine may be used for other purposes; ask your health care provider or pharmacist if you have questions. What should I tell my health care provider before I take this medicine? They need to know if you have any of these conditions: -difficulty passing urine -kidney disease -liver disease -seizures -an unusual or allergic reaction to memantine, other medicines, foods, dyes, or preservatives -pregnant or trying to get pregnant -breast-feeding How should I use this medicine? Take this medicine by mouth with a glass of water. Follow the directions on the prescription label. You may take this medicine with or without food. Take your doses at regular intervals. Do not take your medicine more often than directed. Continue to take your medicine even if you feel better. Do not stop taking except on the advice of your doctor or health care professional. Talk to your pediatrician regarding the use of this medicine in children. Special care may be needed. Overdosage: If you think you have taken too much of this medicine contact a poison control center or emergency room at once. NOTE: This medicine is only for you. Do not share this medicine with others. What if I miss a dose? If you miss a dose, take it as soon as you can. If it is almost time for your next dose, take only that dose. Do not take double or extra doses. If you do not take your medicine for several days, contact your health care provider. Your dose may need to be changed. What may interact with this medicine? -acetazolamide -amantadine -cimetidine -dextromethorphan -dofetilide -hydrochlorothiazide -ketamine -metformin -methazolamide -quinidine -ranitidine -sodium  bicarbonate -triamterene This list may not describe all possible interactions. Give your health care provider a list of all the medicines, herbs, non-prescription drugs, or dietary supplements you use. Also tell them if you smoke, drink alcohol, or use illegal drugs. Some items may interact with your medicine. What should I watch for while using this medicine? Visit your doctor or health care professional for regular checks on your progress. Check with your doctor or health care professional if there is no improvement in your symptoms or if they get worse. You may get drowsy or dizzy. Do not drive, use machinery, or do anything that needs mental alertness until you know how this drug affects you. Do not stand or sit up quickly, especially if you are an older patient. This reduces the risk of dizzy or fainting spells. Alcohol can make you more drowsy and dizzy. Avoid alcoholic drinks. What side effects may I notice from receiving this medicine? Side effects that you should report to your doctor or health care professional as soon as possible: -allergic reactions like skin rash, itching or hives, swelling of the face, lips, or tongue -agitation or a feeling of restlessness -depressed mood -dizziness -hallucinations -redness, blistering, peeling or loosening of the skin, including inside the mouth -seizures -vomiting Side effects that usually do not require medical attention (report to your doctor or health care professional if they continue or are bothersome): -constipation -diarrhea -headache -nausea -trouble sleeping This list may not describe all possible side effects. Call your doctor for medical advice about side effects. You may report side effects to FDA at 1-800-FDA-1088. Where should I keep my medicine? Keep out of the  reach of children. Store at room temperature between 15 degrees and 30 degrees C (59 degrees and 86 degrees F). Throw away any unused medicine after the expiration  date. NOTE: This sheet is a summary. It may not cover all possible information. If you have questions about this medicine, talk to your doctor, pharmacist, or health care provider.    2016, [employer]. (2013-01-14 14:10:42)

## 2015-02-11 NOTE — Progress Notes (Signed)
I have read the note, and I agree with the clinical assessment and plan.  WILLIS,CHARLES KEITH   

## 2015-02-11 NOTE — Progress Notes (Signed)
PATIENT: Gerren Hoffmeier DOB: Dec 23, 1926  REASON FOR VISIT: follow up- memory loss HISTORY FROM: patient  HISTORY OF PRESENT ILLNESS:  Mr. Closs is an 81 year old male with a history of memory loss. He returns today for follow-up. The patient has had blood work, CT scan of the brain as well as carotid Dopplers that of all been relatively unremarkable. The patient states that his memory has remained the same. He states that he typically misplaces things however if he gives himself a minute he is able to recall where he put it. The patient doesn't operate a motor vehicle but denies any trouble driving or getting lost. He is able to complete all ADLs independently. He does live at home with his wife. However she currently in rehabilitation in [place]. Patient states that he is currently preparing his own bills without any difficulty. 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 He denies any trouble with this. Overall he feels that he is doing well. He returns today for an evaluation.  HISTORY 10/10/14 (WILLIS):Mr. Lichtman is an 81 year old right-handed white male with a history of a memory disorder that has been gradually worsening over the last one year. The patient recently has had surgery for perforated bowel, he has had some weight loss with this. The patient lives with his wife, and his son checks on him on a regular basis. Until he had a bowel surgery, he has been operating a motor vehicle without difficulty. He manages his own finances, and keeps track of his own medications and appointments. The patient indicates that he has some difficulty with memory and names of people and places, he has difficulty remembering recent events. He will misplace things about the house on occasion. He indicates that he sleeps well, he has a good energy level. He denies any numbness or weakness on the arms or legs, and he denies issues controlling the bowels or the bladder. He denies  headache, fevers or chills. The patient has had some left-sided tinnitus. He comes into the office today for an evaluation.   REVIEW OF SYSTEMS: Out of a complete 14 system review of symptoms, the patient complains only of the following symptoms, and all other reviewed systems are negative.  Appetite change, unexpected weight change, runny nose, eye discharge, excessive thirst, urgency, nervous/anxious, memory loss  ALLERGIES: Allergies  Allergen Reactions  . Lisinopril     cough    HOME MEDICATIONS: Outpatient Prescriptions Prior to Visit  Medication Sig Dispense Refill  . aspirin EC 81 MG tablet Take 81 mg by mouth at bedtime.    Marland Kitchen atorvastatin (LIPITOR) 20 MG tablet Take 20 mg by mouth at bedtime.     . calcium carbonate (TUMS EX) 750 MG chewable tablet Chew 1 tablet by mouth daily.    . Cholecalciferol (VITAMIN D PO) Take 1 tablet by mouth daily.    . CVS SENNA PLUS 8.6-50 MG per tablet TAKE 2 TABLETS BY MOUTH 2 (TWO) TIMES DAILY. 180 tablet 2  . erythromycin ophthalmic ointment Place into the left eye 4 (four) times daily. Apply 1/2 inch to left eye 4 times a day for 5 days. (Patient taking differently: Place 1 application into the right eye daily as needed (irritation). ) 3.5 g 0  . furosemide (LASIX) 40 MG tablet Take 40 mg by mouth daily.    Marland Kitchen gemfibrozil (LOPID) 600 MG tablet Take 600 mg by mouth 2 (two) times daily before a meal.    . glipiZIDE (  GLUCOTROL XL) 2.5 MG 24 hr tablet Take 2.5 mg by mouth daily.  6  . HYDROcodone-acetaminophen (NORCO) 5-325 MG per tablet Take 1-2 tablets by mouth every 6 (six) hours as needed for moderate pain or severe pain. 30 tablet 0  . Lactulose 20 GM/30ML SOLN Take 30 mLs (20 g total) by mouth 2 (two) times daily as needed (For constipation). (Patient taking differently: Take 30 mLs by mouth daily as needed (For constipation). ) 500 mL 2  . losartan-hydrochlorothiazide (HYZAAR) 100-25 MG per tablet Take 0.5 tablets by mouth daily.     .  metoCLOPramide (REGLAN) 5 MG tablet Take 5 mg by mouth 4 (four) times daily -  before meals and at bedtime.   3  . omeprazole (PRILOSEC) 20 MG capsule Take 20 mg by mouth daily before breakfast.     . OVER THE COUNTER MEDICATION Place 1 drop into the left eye at bedtime as needed (irritation). Over the counter eye drops    . potassium chloride (K-DUR) 10 MEQ tablet Take 10 mEq by mouth daily.    . predniSONE (DELTASONE) 5 MG tablet TAKE 1 TABLET BY MOUTH DAILY 90 tablet 0  . risedronate (ACTONEL) 35 MG tablet Take 35 mg by mouth every 7 (seven) days. On Mondays  5  . sulindac (CLINORIL) 200 MG tablet Take 200 mg by mouth daily.     . vitamin C (ASCORBIC ACID) 500 MG tablet Take 500 mg by mouth daily.    Marland Kitchen GARLIC PO Take 1 tablet by mouth daily.    Marland Kitchen abiraterone Acetate (ZYTIGA) 250 MG tablet TAKE 4 TABLETS BY MOUTH DAILY ON AN EMPTY STOMACH 1 HOUR BEFORE OR 2 HOURS AFTER A MEAL 120 tablet 0   No facility-administered medications prior to visit.    PAST MEDICAL HISTORY: Past Medical History  Diagnosis Date  . Prostate CA [facility])     prostate ca dx 20 yrs ago  . Hypertension   . Hyperlipidemia   . Hyperthyroidism   . Anxiety   . Dysrhythmia   . Heart murmur   . Constipation   . GERD (gastroesophageal reflux disease)   . Diabetes mellitus     Type II  . CAD (coronary artery disease)     08/25/03 cath [facility]): NL coronaries, EF 70-75%   . Memory difficulties 10/10/2014    PAST SURGICAL HISTORY: Past Surgical History  Procedure Laterality Date  . Appendectomy    . Penile prosthesis implant    . Hernia repair Left   . Colonoscopy    . Inguinal hernia repair  07/14/2014    with mesh  . Prostatectomy    . Inguinal hernia repair Right 07/14/2014    Procedure: OPEN REPAIR RIGHT INGUINAL HERNIA ;  Surgeon: Fanny Skates, MD;  Location: [place];  Service: General;  Laterality: Right;  . Insertion of mesh Right 07/14/2014    Procedure: INSERTION OF MESH;  Surgeon: Fanny Skates, MD;   Location: [place];  Service: General;  Laterality: Right;  . Laparotomy N/A 08/24/2014    Procedure: EXPLORATORY LAPAROTOMY, RESECTION SIGMOID COLON, COLOSTOMY;  Surgeon: Jackolyn Confer, MD;  [facility];  Service: General;  Laterality: N/A;  . Colostomy      FAMILY HISTORY: Family History  Problem Relation Age of Onset  . CVA Mother   . Alcohol abuse Brother   . Dementia Sister     SOCIAL HISTORY: Social History   Social History  . Marital Status: Married    Spouse Name:  N/A  . Number of Children: 1  . Years of Education: 48   Occupational History  . retired    Social History Main Topics  . Smoking status: Former Smoker -- 35 years  . Smokeless tobacco: Never Used     Comment: 07/07/14  . Alcohol Use: No  . Drug Use: No  . Sexual Activity: Not on file   Other Topics Concern  . Not on file   Social History Narrative   Patient does not drink caffeine.   Patient is right handed.      PHYSICAL EXAM  Filed Vitals:   02/11/15 1039  BP: 134/66  Pulse: 57  Height: 5\' 7"  (1.702 m)  Weight: 148 lb (67.132 kg)   Body mass index is 23.17 kg/(m^2).  MMSE - Mini Mental State Exam 02/11/2015 10/10/2014  Orientation to time 4 2  Orientation to Place 3 3  Registration 3 3  Attention/ Calculation 2 1  Recall 2 1  Language- name 2 objects 2 2  Language- repeat 1 1  Language- follow 3 step command 2 3  Language- read & follow direction 1 1  Write a sentence 1 1  Copy design 0 0  Total score 21 18    Generalized: Well developed, in no acute distress   Neurological examination  Mentation: Alert. Follows all commands speech and language fluent Cranial nerve II-XII: Pupils were equal round reactive to light. Extraocular movements were full, visual field were full on confrontational test. Facial sensation and strength were normal. Uvula tongue midline. Head turning and shoulder shrug  were normal and symmetric. Motor: The motor testing reveals 5 over 5 strength of all  4 extremities. Good symmetric motor tone is noted throughout.  Sensory: Sensory testing is intact to soft touch on all 4 extremities. No evidence of extinction is noted.  Coordination: Cerebellar testing reveals good finger-nose-finger and heel-to-shin bilaterally.  Gait and station: Gait is normal. Tandem gait is normal. Romberg is negative. No drift is seen.  Reflexes: Deep tendon reflexes are symmetric and normal bilaterally.   DIAGNOSTIC DATA (LABS, IMAGING, TESTING) - I reviewed patient records, labs, notes, testing and imaging myself where available.  Lab Results  Component Value Date   WBC 4.9 02/04/2015   HGB 11.8* 02/04/2015   HCT 36.8* 02/04/2015   MCV 89.5 02/04/2015   PLT 129* 02/04/2015      Component Value Date/Time   NA 143 02/04/2015 0810   NA 140 01/16/2015 1406   NA 141 07/06/2011 0855   K 3.3* 02/04/2015 0810   K 3.2* 01/16/2015 1406   K 3.6 07/06/2011 0855   CL 105 01/16/2015 1406   CL 105 09/17/2012 0801   CL 104 07/06/2011 0855   CO2 29 02/04/2015 0810   CO2 26 01/16/2015 1406   CO2 28 07/06/2011 0855   GLUCOSE 130 02/04/2015 0810   GLUCOSE 71 01/16/2015 1406   GLUCOSE 207* 09/17/2012 0801   GLUCOSE 90 07/06/2011 0855   BUN 22.3 02/04/2015 0810   BUN 22* 01/16/2015 1406   BUN 18 07/06/2011 0855   CREATININE 1.5* 02/04/2015 0810   CREATININE 1.28* 01/16/2015 1406   CREATININE 1.6* 07/06/2011 0855   CALCIUM 9.1 02/04/2015 0810   CALCIUM 9.1 01/16/2015 1406   CALCIUM 8.8 07/06/2011 0855   PROT 7.0 02/04/2015 0810   PROT 6.8 08/24/2014 1213   PROT 7.5 07/06/2011 0855   ALBUMIN 3.4* 02/04/2015 0810   ALBUMIN 3.5 08/24/2014 1213   ALBUMIN 3.5 07/06/2011 7858  AST 17 02/04/2015 0810   AST 24 08/24/2014 1213   AST 22 07/06/2011 0855   ALT 14 02/04/2015 0810   ALT 13* 08/24/2014 1213   ALT 18 07/06/2011 0855   ALKPHOS 113 02/04/2015 0810   ALKPHOS 62 08/24/2014 1213   ALKPHOS 56 07/06/2011 0855   BILITOT 0.90 02/04/2015 0810   BILITOT 1.7*  08/24/2014 1213   BILITOT 0.80 07/06/2011 0855   GFRNONAA 49* 01/16/2015 1406   GFRAA 57* 01/16/2015 1406     Lab Results  Component Value Date   VITAMINB12 482 10/10/2014   Lab Results  Component Value Date   TSH 1.415 01/16/2015      ASSESSMENT AND PLAN 81 y.o. year old male  has a past medical history of Prostate CA ([place]); Hypertension; Hyperlipidemia; Hyperthyroidism; Anxiety; Dysrhythmia; Heart murmur; Constipation; GERD (gastroesophageal reflux disease); Diabetes mellitus; CAD (coronary artery disease); and Memory difficulties (10/10/2014). here with:  1. Memory loss  Overall the patient's memory score has remained stable. His MMSE today is 21/30 was previously 18/30. I have discussed memory medication with the patient. The patient's heart rate is slightly low and for that reason I would hold off on Aricept at this time. I did discuss Namenda with the patient and provided him with a handout reviewing side effects of this medication. Patient states that he will look this over and give the office a call if he decides he wants to start this medication. Patient advised that if his symptoms worsen or he develops any new symptoms he should let us know. He will follow-up in 3-4 months or sooner if needed.   Ward Givens, MSN, NP-C 02/11/2015, 11:34 AM [facility] [address] [place], [place] [address] [phone]

## 2015-03-04 ENCOUNTER — Encounter: Payer: Self-pay | Admitting: Pharmacist

## 2015-03-04 NOTE — Progress Notes (Signed)
11/23 - Mr. Shane [name] has been approved through [employer] and [employer] for [facility]. Copay should now be $0. Pt can still have medication filled at [facility]. [name] and [name] to supply code needed on benefits analysis for free medication.  Thank you,  Montel Clock, PharmD, [facility]

## 2015-03-10 ENCOUNTER — Encounter (HOSPITAL_COMMUNITY): Payer: Self-pay | Admitting: General Practice

## 2015-03-10 ENCOUNTER — Inpatient Hospital Stay (HOSPITAL_COMMUNITY)
Admission: AD | Admit: 2015-03-10 | Discharge: 2015-03-12 | DRG: 293 | Discharge status: Against Medical Advice | Payer: Medicare Other | Source: Ambulatory Visit | Attending: Cardiovascular Disease | Admitting: Cardiovascular Disease

## 2015-03-10 DIAGNOSIS — E785 Hyperlipidemia, unspecified: Secondary | ICD-10-CM | POA: Diagnosis present

## 2015-03-10 DIAGNOSIS — I2510 Atherosclerotic heart disease of native coronary artery without angina pectoris: Secondary | ICD-10-CM | POA: Diagnosis present

## 2015-03-10 DIAGNOSIS — K219 Gastro-esophageal reflux disease without esophagitis: Secondary | ICD-10-CM | POA: Diagnosis present

## 2015-03-10 DIAGNOSIS — D509 Iron deficiency anemia, unspecified: Secondary | ICD-10-CM | POA: Diagnosis present

## 2015-03-10 DIAGNOSIS — R0602 Shortness of breath: Secondary | ICD-10-CM | POA: Diagnosis present

## 2015-03-10 DIAGNOSIS — E119 Type 2 diabetes mellitus without complications: Secondary | ICD-10-CM | POA: Diagnosis present

## 2015-03-10 DIAGNOSIS — I110 Hypertensive heart disease with heart failure: Principal | ICD-10-CM | POA: Diagnosis present

## 2015-03-10 DIAGNOSIS — Z8546 Personal history of malignant neoplasm of prostate: Secondary | ICD-10-CM | POA: Diagnosis not present

## 2015-03-10 DIAGNOSIS — E876 Hypokalemia: Secondary | ICD-10-CM | POA: Diagnosis present

## 2015-03-10 DIAGNOSIS — Z87891 Personal history of nicotine dependence: Secondary | ICD-10-CM

## 2015-03-10 DIAGNOSIS — I509 Heart failure, unspecified: Secondary | ICD-10-CM

## 2015-03-10 DIAGNOSIS — I5021 Acute systolic (congestive) heart failure: Secondary | ICD-10-CM | POA: Diagnosis present

## 2015-03-10 DIAGNOSIS — Z23 Encounter for immunization: Secondary | ICD-10-CM | POA: Diagnosis not present

## 2015-03-10 DIAGNOSIS — E0590 Thyrotoxicosis, unspecified without thyrotoxic crisis or storm: Secondary | ICD-10-CM | POA: Diagnosis present

## 2015-03-10 HISTORY — DX: Unspecified osteoarthritis, unspecified site: M19.90

## 2015-03-10 HISTORY — DX: Type 2 diabetes mellitus without complications: E11.9

## 2015-03-10 HISTORY — DX: Personal history of other diseases of the musculoskeletal system and connective tissue: Z87.39

## 2015-03-10 LAB — CBC WITH DIFFERENTIAL/PLATELET
BASOS ABS: 0 10*3/uL (ref 0.0–0.1)
BASOS PCT: 0 %
EOS ABS: 0.1 10*3/uL (ref 0.0–0.7)
EOS PCT: 2 %
HCT: 33.9 % — ABNORMAL LOW (ref 39.0–52.0)
Hemoglobin: 10.9 g/dL — ABNORMAL LOW (ref 13.0–17.0)
LYMPHS PCT: 41 %
Lymphs Abs: 1.5 10*3/uL (ref 0.7–4.0)
MCH: 28.8 pg (ref 26.0–34.0)
MCHC: 32.2 g/dL (ref 30.0–36.0)
MCV: 89.7 fL (ref 78.0–100.0)
MONO ABS: 0.3 10*3/uL (ref 0.1–1.0)
Monocytes Relative: 9 %
Neutro Abs: 1.8 10*3/uL (ref 1.7–7.7)
Neutrophils Relative %: 48 %
PLATELETS: 135 10*3/uL — AB (ref 150–400)
RBC: 3.78 MIL/uL — ABNORMAL LOW (ref 4.22–5.81)
RDW: 16.1 % — AB (ref 11.5–15.5)
WBC: 3.6 10*3/uL — AB (ref 4.0–10.5)

## 2015-03-10 LAB — GLUCOSE, CAPILLARY
GLUCOSE-CAPILLARY: 100 mg/dL — AB (ref 65–99)
GLUCOSE-CAPILLARY: 127 mg/dL — AB (ref 65–99)
Glucose-Capillary: 62 mg/dL — ABNORMAL LOW (ref 65–99)

## 2015-03-10 LAB — COMPREHENSIVE METABOLIC PANEL
ALT: 15 U/L — ABNORMAL LOW (ref 17–63)
AST: 21 U/L (ref 15–41)
Albumin: 2.9 g/dL — ABNORMAL LOW (ref 3.5–5.0)
Alkaline Phosphatase: 78 U/L (ref 38–126)
Anion gap: 6 (ref 5–15)
BUN: 11 mg/dL (ref 6–20)
CHLORIDE: 107 mmol/L (ref 101–111)
CO2: 29 mmol/L (ref 22–32)
Calcium: 8.6 mg/dL — ABNORMAL LOW (ref 8.9–10.3)
Creatinine, Ser: 1.06 mg/dL (ref 0.61–1.24)
GFR calc Af Amer: >60 mL/min (ref >60)
Glucose, Bld: 103 mg/dL — ABNORMAL HIGH (ref 65–99)
POTASSIUM: 3.2 mmol/L — AB (ref 3.5–5.1)
SODIUM: 142 mmol/L (ref 135–145)
Total Bilirubin: 0.8 mg/dL (ref 0.3–1.2)
Total Protein: 6.3 g/dL — ABNORMAL LOW (ref 6.5–8.1)

## 2015-03-10 LAB — TROPONIN I: TROPONIN I: 0.04 ng/mL — AB (ref <0.031)

## 2015-03-10 LAB — BRAIN NATRIURETIC PEPTIDE: B NATRIURETIC PEPTIDE 5: 559.5 pg/mL — AB (ref 0.0–100.0)

## 2015-03-10 MED ORDER — POTASSIUM CHLORIDE ER 10 MEQ PO TBCR: 10.0000 meq | EXTENDED_RELEASE_TABLET | Freq: Every day | ORAL | Status: DC

## 2015-03-10 MED ORDER — METOCLOPRAMIDE HCL 5 MG PO TABS
5.0000 mg | ORAL_TABLET | Freq: Three times a day (TID) | ORAL | Status: DC
  Administered 2015-03-10 – 2015-03-12 (×7): 5 mg via ORAL
  Filled 2015-03-10 (×7): qty 1

## 2015-03-10 MED ORDER — GEMFIBROZIL 600 MG PO TABS
600.0000 mg | ORAL_TABLET | Freq: Two times a day (BID) | ORAL | Status: DC
  Administered 2015-03-10 – 2015-03-12 (×4): 600 mg via ORAL
  Filled 2015-03-10 (×6): qty 1

## 2015-03-10 MED ORDER — POTASSIUM CHLORIDE ER 10 MEQ PO TBCR
20.0000 meq | EXTENDED_RELEASE_TABLET | Freq: Once | ORAL | Status: AC
Start: 2015-03-10 — End: 2015-03-10
  Administered 2015-03-10: 20 meq via ORAL
  Filled 2015-03-10: qty 2

## 2015-03-10 MED ORDER — ATORVASTATIN CALCIUM 20 MG PO TABS
20.0000 mg | ORAL_TABLET | Freq: Every day | ORAL | Status: DC
  Administered 2015-03-10 – 2015-03-11 (×2): 20 mg via ORAL
  Filled 2015-03-10 (×2): qty 1

## 2015-03-10 MED ORDER — ENOXAPARIN SODIUM 40 MG/0.4ML ~~LOC~~ SOLN
40.0000 mg | SUBCUTANEOUS | Status: DC
  Administered 2015-03-10 – 2015-03-11 (×2): 40 mg via SUBCUTANEOUS
  Filled 2015-03-10 (×2): qty 0.4

## 2015-03-10 MED ORDER — CALCIUM CARBONATE 1250 (500 CA) MG PO TABS
1.0000 | ORAL_TABLET | Freq: Every day | ORAL | Status: DC
  Administered 2015-03-10 – 2015-03-12 (×3): 500 mg via ORAL
  Filled 2015-03-10 (×3): qty 1

## 2015-03-10 MED ORDER — INFLUENZA VAC SPLIT QUAD 0.5 ML IM SUSY
0.5000 mL | PREFILLED_SYRINGE | INTRAMUSCULAR | Status: AC
  Administered 2015-03-11: 0.5 mL via INTRAMUSCULAR
  Filled 2015-03-10: qty 0.5

## 2015-03-10 MED ORDER — PANTOPRAZOLE SODIUM 40 MG PO TBEC
40.0000 mg | DELAYED_RELEASE_TABLET | Freq: Every day | ORAL | Status: DC
  Administered 2015-03-10 – 2015-03-12 (×3): 40 mg via ORAL
  Filled 2015-03-10 (×3): qty 1

## 2015-03-10 MED ORDER — ACETAMINOPHEN 325 MG PO TABS: 650.0000 mg | ORAL_TABLET | ORAL | Status: DC | PRN

## 2015-03-10 MED ORDER — SODIUM CHLORIDE 0.9 % IJ SOLN
3.0000 mL | Freq: Two times a day (BID) | INTRAMUSCULAR | Status: DC
  Administered 2015-03-10 – 2015-03-12 (×4): 3 mL via INTRAVENOUS

## 2015-03-10 MED ORDER — POTASSIUM CHLORIDE CRYS ER 20 MEQ PO TBCR
20.0000 meq | EXTENDED_RELEASE_TABLET | Freq: Three times a day (TID) | ORAL | Status: DC
  Administered 2015-03-11 – 2015-03-12 (×4): 20 meq via ORAL
  Filled 2015-03-10 (×10): qty 1

## 2015-03-10 MED ORDER — ONDANSETRON HCL 4 MG/2ML IJ SOLN: 4.0000 mg | Freq: Four times a day (QID) | INTRAMUSCULAR | Status: DC | PRN

## 2015-03-10 MED ORDER — ASPIRIN EC 81 MG PO TBEC
81.0000 mg | DELAYED_RELEASE_TABLET | Freq: Every day | ORAL | Status: DC
  Administered 2015-03-10 – 2015-03-11 (×2): 81 mg via ORAL
  Filled 2015-03-10 (×2): qty 1

## 2015-03-10 MED ORDER — FUROSEMIDE 10 MG/ML IJ SOLN
40.0000 mg | Freq: Two times a day (BID) | INTRAMUSCULAR | Status: DC
  Administered 2015-03-11 – 2015-03-12 (×3): 40 mg via INTRAVENOUS
  Filled 2015-03-10 (×3): qty 4

## 2015-03-10 MED ORDER — SODIUM CHLORIDE 0.9 % IJ SOLN: 3.0000 mL | INTRAMUSCULAR | Status: DC | PRN

## 2015-03-10 MED ORDER — SODIUM CHLORIDE 0.9 % IV SOLN: 250.0000 mL | INTRAVENOUS | Status: DC | PRN

## 2015-03-10 NOTE — H&P (Signed)
Referring Physician:  [name] is an 81 y.o. male.                       Chief Complaint: Leg edema and shortness of breath  HPI: 81 year old male has 5-10 pound weight gain, leg edema and exertional dyspnea x 1 week. Some chest discomfort. No nausea or sweating spell. No fever or cough.  Past Medical History  Diagnosis Date  . Prostate CA [facility])     prostate ca dx 20 yrs ago  . Hypertension   . Hyperlipidemia   . Hyperthyroidism   . Anxiety   . Dysrhythmia   . Heart murmur   . Constipation   . GERD (gastroesophageal reflux disease)   . Diabetes mellitus     Type II  . CAD (coronary artery disease)     08/25/03 cath [facility]): NL coronaries, EF 70-75%   . Memory difficulties 10/10/2014      Past Surgical History  Procedure Laterality Date  . Appendectomy    . Penile prosthesis implant    . Hernia repair Left   . Colonoscopy    . Inguinal hernia repair  07/14/2014    with mesh  . Prostatectomy    . Inguinal hernia repair Right 07/14/2014    Procedure: OPEN REPAIR RIGHT INGUINAL HERNIA ;  Surgeon: Shane Skates, MD;  Location: [place];  Service: General;  Laterality: Right;  . Insertion of mesh Right 07/14/2014    Procedure: INSERTION OF MESH;  Surgeon: Shane Skates, MD;  Location: [place];  Service: General;  Laterality: Right;  . Laparotomy N/A 08/24/2014    Procedure: EXPLORATORY LAPAROTOMY, RESECTION SIGMOID COLON, COLOSTOMY;  Surgeon: Shane Confer, MD;  [facility];  Service: General;  Laterality: N/A;  . Colostomy      Family History  Problem Relation Age of Onset  . CVA Mother   . Alcohol abuse Brother   . Dementia Sister    Social History:  reports that he has quit smoking. He has never used smokeless tobacco. He reports that he does not drink alcohol or use illicit drugs.  Allergies:  Allergies  Allergen Reactions  . Lisinopril     cough    Medications Prior to Admission  Medication Sig Dispense Refill  . abiraterone Acetate (ZYTIGA) 250 MG tablet  TAKE 4 TABLETS BY MOUTH DAILY ON AN EMPTY STOMACH 1 HOUR BEFORE OR 2 HOURS AFTER A MEAL 120 tablet 0  . alendronate (FOSAMAX) 70 MG tablet Take 1 tablet by mouth daily.  5  . aspirin EC 81 MG tablet Take 81 mg by mouth at bedtime.    Marland Kitchen atorvastatin (LIPITOR) 20 MG tablet Take 20 mg by mouth at bedtime.     . calcium carbonate (TUMS EX) 750 MG chewable tablet Chew 1 tablet by mouth daily.    . Cholecalciferol (VITAMIN D PO) Take 1 tablet by mouth daily.    . CVS SENNA PLUS 8.6-50 MG per tablet TAKE 2 TABLETS BY MOUTH 2 (TWO) TIMES DAILY. 180 tablet 2  . erythromycin ophthalmic ointment Place into the left eye 4 (four) times daily. Apply 1/2 inch to left eye 4 times a day for 5 days. (Patient taking differently: Place 1 application into the right eye daily as needed (irritation). ) 3.5 g 0  . furosemide (LASIX) 40 MG tablet Take 40 mg by mouth daily.    Marland Kitchen gemfibrozil (LOPID) 600 MG tablet Take 600 mg by mouth 2 (two) times  daily before a meal.    . glipiZIDE (GLUCOTROL XL) 2.5 MG 24 hr tablet Take 2.5 mg by mouth daily.  6  . HYDROcodone-acetaminophen (NORCO) 5-325 MG per tablet Take 1-2 tablets by mouth every 6 (six) hours as needed for moderate pain or severe pain. 30 tablet 0  . Lactulose 20 GM/30ML SOLN Take 30 mLs (20 g total) by mouth 2 (two) times daily as needed (For constipation). (Patient taking differently: Take 30 mLs by mouth daily as needed (For constipation). ) 500 mL 2  . losartan-hydrochlorothiazide (HYZAAR) 100-25 MG per tablet Take 0.5 tablets by mouth daily.     . metoCLOPramide (REGLAN) 5 MG tablet Take 5 mg by mouth 4 (four) times daily -  before meals and at bedtime.   3  . omeprazole (PRILOSEC) 20 MG capsule Take 20 mg by mouth daily before breakfast.     . OVER THE COUNTER MEDICATION Place 1 drop into the left eye at bedtime as needed (irritation). Over the counter eye drops    . potassium chloride (K-DUR) 10 MEQ tablet Take 10 mEq by mouth daily.    . predniSONE (DELTASONE)  5 MG tablet TAKE 1 TABLET BY MOUTH DAILY 90 tablet 0  . risedronate (ACTONEL) 35 MG tablet Take 35 mg by mouth every 7 (seven) days. On Mondays  5  . sulindac (CLINORIL) 200 MG tablet Take 200 mg by mouth daily.     . vitamin C (ASCORBIC ACID) 500 MG tablet Take 500 mg by mouth daily.      Results for orders placed or performed during the hospital encounter of 03/10/15 (from the past 48 hour(s))  Glucose, capillary     Status: Abnormal   Collection Time: 03/10/15  5:28 PM  Result Value Ref Range   Glucose-Capillary 62 (L) 65 - 99 mg/dL   No results found.  Review Of Systems General Present- Night Sweats. Not Present- Appetite Loss, Chills, Fatigue, Fever, Weight Gain and Weight Loss. Skin Not Present- Change in Wart/Mole, Dryness, Hives, Jaundice, New Lesions, Non-Healing Wounds, Rash and Ulcer. HEENT Not Present- Earache, Hearing Loss, Hoarseness, Nose Bleed, Oral Ulcers, Ringing in the Ears, Seasonal Allergies, Sinus Pain, Sore Throat, Visual Disturbances, Wears glasses/contact lenses and Yellow Eyes. Respiratory Not Present- Bloody sputum, Chronic Cough, Difficulty Breathing, Snoring and Wheezing. Breast Not Present- Breast Mass, Breast Pain, Nipple Discharge and Skin Changes. Cardiovascular Present- Leg Cramps. Not Present- Chest Pain, Difficulty Breathing Lying Down, Palpitations, Rapid Heart Rate, Shortness of Breath and Swelling of Extremities. Gastrointestinal Present- Constipation. Not Present- Abdominal Pain, Bloating, Bloody Stool, Change in Bowel Habits, Chronic diarrhea, Difficulty Swallowing, Excessive gas, Gets full quickly at meals, Hemorrhoids, Indigestion, Nausea, Rectal Pain and Vomiting. Musculoskeletal Not Present- Back Pain, Joint Pain, Joint Stiffness, Muscle Pain, Muscle Weakness and Swelling of Extremities. Neurological Present- Decreased Memory. Not Present- Fainting, Headaches, Numbness, Seizures, Tingling, Tremor, Trouble walking and Weakness. Psychiatric Not  Present- Anxiety, Bipolar, Change in Sleep Pattern, Depression, Fearful and Frequent crying. Endocrine Present- New Diabetes. Not Present- Cold Intolerance, Excessive Hunger, Hair Changes, Heat Intolerance and Hot flashes. Hematology Not Present- Easy Bruising, Excessive bleeding, Gland problems, HIV and Persistent Infections.  Blood pressure 189/84, pulse 68, temperature 98.5 F (36.9 C), temperature source Oral, resp. rate 18, weight 67.9 kg (149 lb 11.1 oz), SpO2 98 %. Physical Exam  Constitutional: Elderly male who appears mildly dyspneic.  HENT: Normocephalic and atraumatic, brown eyes, conj-pink, Sclera-white, wears glasses.  Neck: + JVD, supple Cardiovascular: Normal rate, Intermittent irregular rhythm.  Gr III/VI systolic murmur.  Respiratory: Bibasilar crackles. Marland Kitchen  GI: Soft. Non-distended and non-tender.Lower midline scar. Indented right lower quadrant scar.  Musculoskeletal: He exhibits 2 + edema up to knee.Marland Kitchen  Neurological: He is alert. Moves all 4 extremities. Moves slowly without cane. Skin: Skin is warm and dry.   Assessment/Plan Acute left heart systolic failure Hypertension DM, II CAD  Admit/IV lasix/Echocardiogram/Home medications  Birdie Riddle, MD  03/10/2015, 5:32 PM

## 2015-03-11 ENCOUNTER — Inpatient Hospital Stay (HOSPITAL_COMMUNITY): Payer: Medicare Other

## 2015-03-11 DIAGNOSIS — I110 Hypertensive heart disease with heart failure: Secondary | ICD-10-CM | POA: Diagnosis not present

## 2015-03-11 LAB — IRON AND TIBC
IRON: 34 ug/dL — AB (ref 45–182)
Saturation Ratios: 17 % — ABNORMAL LOW (ref 17.9–39.5)
TIBC: 196 ug/dL — AB (ref 250–450)
UIBC: 162 ug/dL

## 2015-03-11 LAB — TROPONIN I
TROPONIN I: 0.03 ng/mL (ref <0.031)
Troponin I: 0.03 ng/mL (ref <0.031)

## 2015-03-11 LAB — GLUCOSE, CAPILLARY
GLUCOSE-CAPILLARY: 72 mg/dL (ref 65–99)
GLUCOSE-CAPILLARY: 116 mg/dL — AB (ref 65–99)
GLUCOSE-CAPILLARY: 99 mg/dL (ref 65–99)
Glucose-Capillary: 78 mg/dL (ref 65–99)

## 2015-03-11 LAB — BASIC METABOLIC PANEL
Anion gap: 6 (ref 5–15)
BUN: 12 mg/dL (ref 6–20)
CO2: 28 mmol/L (ref 22–32)
CREATININE: 0.91 mg/dL (ref 0.61–1.24)
Calcium: 8.2 mg/dL — ABNORMAL LOW (ref 8.9–10.3)
Chloride: 108 mmol/L (ref 101–111)
Glucose, Bld: 68 mg/dL (ref 65–99)
Potassium: 3.2 mmol/L — ABNORMAL LOW (ref 3.5–5.1)
SODIUM: 142 mmol/L (ref 135–145)

## 2015-03-11 LAB — FERRITIN: Ferritin: 58 ng/mL (ref 24–336)

## 2015-03-11 MED ORDER — LOSARTAN POTASSIUM 50 MG PO TABS
100.0000 mg | ORAL_TABLET | Freq: Every day | ORAL | Status: DC
  Administered 2015-03-11 – 2015-03-12 (×2): 100 mg via ORAL
  Filled 2015-03-11 (×3): qty 2

## 2015-03-11 MED ORDER — FERROUS SULFATE 325 (65 FE) MG PO TABS
325.0000 mg | ORAL_TABLET | Freq: Every day | ORAL | Status: DC
  Administered 2015-03-12: 325 mg via ORAL
  Filled 2015-03-11: qty 1

## 2015-03-11 MED ORDER — AMLODIPINE BESYLATE 5 MG PO TABS
5.0000 mg | ORAL_TABLET | Freq: Every day | ORAL | Status: DC
  Administered 2015-03-11 – 2015-03-12 (×2): 5 mg via ORAL
  Filled 2015-03-11 (×2): qty 1

## 2015-03-11 MED ORDER — VITAMIN C 500 MG PO TABS
500.0000 mg | ORAL_TABLET | Freq: Every day | ORAL | Status: DC
  Administered 2015-03-12: 500 mg via ORAL
  Filled 2015-03-11: qty 1

## 2015-03-11 NOTE — Progress Notes (Signed)
Heart Failure Navigator Consult Note  Presentation: Shane [name] is a 81 year old male has 5-10 pound weight gain, leg edema and exertional dyspnea x 1 week. Some chest discomfort. No nausea or sweating spell. No fever or cough.  Past Medical History  Diagnosis Date  . Hypertension   . Hyperlipidemia   . Hyperthyroidism   . Anxiety   . Dysrhythmia   . Heart murmur   . Constipation   . GERD (gastroesophageal reflux disease)   . Memory difficulties 10/10/2014  . Type II diabetes mellitus ([place])   . CAD (coronary artery disease)     08/25/03 cath [facility]): NL coronaries, EF 70-75%   . Arthritis     "back, hips" (03/10/2015)  . History of gout   . Prostate CA [facility])     prostate ca dx 20 yrs ago    Social History   Social History  . Marital Status: Married    Spouse Name: N/A  . Number of Children: 1  . Years of Education: 75   Occupational History  . retired    Social History Main Topics  . Smoking status: Former Smoker -- 1.00 packs/day for 45 years    Types: Cigarettes  . Smokeless tobacco: Former Systems developer    Types: Chew     Comment: 03/10/2015 "quit smoking in the 1990's; quit chewing when I was in my 26's"  . Alcohol Use: No  . Drug Use: No  . Sexual Activity: No   Other Topics Concern  . None   Social History Narrative   Patient does not drink caffeine.   Patient is right handed.    ECHO: Pending  BNP    Component Value Date/Time   BNP 559.5* 03/10/2015 1818    ProBNP No results found for: PROBNP   Education Assessment and Provision:  Detailed education and instructions provided on heart failure disease management including the following:  Signs and symptoms of Heart Failure When to call the physician Importance of daily weights Low sodium diet Fluid restriction Medication management Anticipated future follow-up appointments  Patient education given on each of the above topics.  Patient acknowledges understanding and acceptance of all  instructions.  I spoke with Mr. [name] regarding his HF.  He tells me that "no one has ever said that I have Heart Failure".  I spoke briefly about the pending echo and that his symptoms seem to indicate that he does indeed have HF.  He says that he has a scale and has not been weighing each day.  I reviewed the importance of daily weights and when to call the physician related to weight gains.  I discussed a low sodium diet and high sodium foods to avoid.  He says that his wife does not cook much since she "cut back"--so he often eats out and enjoys barbeque, greens and baked beans.  I encouraged him to be very careful eating out as it is difficult to control sodium intake.  He denies any issues getting or taking prescribed medications.  He will continue to follow with Dr. Doylene Canard after discharge.  Education Materials:  "Living Better With Heart Failure" Booklet, Daily Weight Tracker Tool and Heart Failure Educational Video.   High Risk Criteria for Readmission and/or Poor Patient Outcomes:    EF <30%- echo pending  2 or more admissions in 6 months- No  Difficult social situation- No-lives with wife  Demonstrates medication noncompliance-denies   Barriers of Care:  Knowledge and compliance  Discharge Planning:  He plans to return to home with his wife.  He would benefit form HHRN for ongoing education, compliance reinforcement and symptom recognition.

## 2015-03-11 NOTE — Progress Notes (Signed)
Ref: Birdie Riddle, MD   Subjective:  Elevated BNP. Good diuresis. Decreasing leg edema. Normal Troponin-I.  Objective:  Vital Signs in the last 24 hours: Temp:  [98.4 F (36.9 C)-98.8 F (37.1 C)] 98.4 F (36.9 C) (11/30 2100) Pulse Rate:  [46-88] 46 (11/30 2100) Cardiac Rhythm:  [-] Normal sinus rhythm (11/30 1900) Resp:  [18-20] 20 (11/30 2100) BP: (133-188)/(63-82) 188/82 mmHg (11/30 2100) SpO2:  [100 %] 100 % (11/30 2100) Weight:  [68.176 kg (150 lb 4.8 oz)] 68.176 kg (150 lb 4.8 oz) (11/30 0544)  Physical Exam: BP Readings from Last 1 Encounters:  03/11/15 188/82    Wt Readings from Last 1 Encounters:  03/11/15 68.176 kg (150 lb 4.8 oz)    Weight change:   HEENT: [place]/AT, Eyes-Brown, PERL, EOMI, Conjunctiva-Pink, Sclera-Non-icteric Neck: + JVD, No bruit, Trachea midline. Lungs:  Clear, Bilateral. Cardiac:  Regular rhythm, normal S1 and S2, no S3. III/VI systolic murmur Abdomen:  Soft, non-tender. Extremities:  2 + edema present. No cyanosis. No clubbing. CNS: AxOx3, Cranial nerves grossly intact, moves all 4 extremities. Right handed. Skin: Warm and dry.   Intake/Output from previous day: 11/29 0701 - 11/30 0700 In: 1185 [P.O.:1185] Out: 700 [Urine:700]    Lab Results: BMET    Component Value Date/Time   NA 142 03/11/2015 0545   NA 142 03/10/2015 1818   NA 143 02/04/2015 0810   NA 140 01/16/2015 1406   NA 144 06/24/2014 1220   NA 143 04/30/2014 1351   NA 141 07/06/2011 0855   K 3.2* 03/11/2015 0545   K 3.2* 03/10/2015 1818   K 3.3* 02/04/2015 0810   K 3.2* 01/16/2015 1406   K 3.9 06/24/2014 1220   K 4.2 04/30/2014 1351   K 3.6 07/06/2011 0855   CL 108 03/11/2015 0545   CL 107 03/10/2015 1818   CL 105 01/16/2015 1406   CL 105 09/17/2012 0801   CL 107 08/08/2012 0755   CL 105 06/27/2012 1121   CL 104 07/06/2011 0855   CO2 28 03/11/2015 0545   CO2 29 03/10/2015 1818   CO2 29 02/04/2015 0810   CO2 26 01/16/2015 1406   CO2 26 06/24/2014 1220   CO2 29 04/30/2014 1351   CO2 28 07/06/2011 0855   GLUCOSE 68 03/11/2015 0545   GLUCOSE 103* 03/10/2015 1818   GLUCOSE 130 02/04/2015 0810   GLUCOSE 71 01/16/2015 1406   GLUCOSE 138 06/24/2014 1220   GLUCOSE 81 04/30/2014 1351   GLUCOSE 207* 09/17/2012 0801   GLUCOSE 202* 08/08/2012 0755   GLUCOSE 127* 06/27/2012 1121   GLUCOSE 90 07/06/2011 0855   BUN 12 03/11/2015 0545   BUN 11 03/10/2015 1818   BUN 22.3 02/04/2015 0810   BUN 22* 01/16/2015 1406   BUN 17.4 06/24/2014 1220   BUN 20.7 04/30/2014 1351   BUN 18 07/06/2011 0855   CREATININE 0.91 03/11/2015 0545   CREATININE 1.06 03/10/2015 1818   CREATININE 1.5* 02/04/2015 0810   CREATININE 1.28* 01/16/2015 1406   CREATININE 1.3 06/24/2014 1220   CREATININE 1.3 04/30/2014 1351   CREATININE 1.6* 07/06/2011 0855   CALCIUM 8.2* 03/11/2015 0545   CALCIUM 8.6* 03/10/2015 1818   CALCIUM 9.1 02/04/2015 0810   CALCIUM 9.1 01/16/2015 1406   CALCIUM 9.0 06/24/2014 1220   CALCIUM 8.6 04/30/2014 1351   CALCIUM 8.8 07/06/2011 0855   GFRNONAA >60 03/11/2015 0545   GFRNONAA >60 03/10/2015 1818   GFRNONAA 49* 01/16/2015 1406   GFRAA >60 03/11/2015 0545  GFRAA >60 03/10/2015 1818   GFRAA 57* 01/16/2015 1406   CBC    Component Value Date/Time   WBC 3.6* 03/10/2015 1818   WBC 4.9 02/04/2015 0809   RBC 3.78* 03/10/2015 1818   RBC 4.11* 02/04/2015 0809   HGB 10.9* 03/10/2015 1818   HGB 11.8* 02/04/2015 0809   HCT 33.9* 03/10/2015 1818   HCT 36.8* 02/04/2015 0809   PLT 135* 03/10/2015 1818   PLT 129* 02/04/2015 0809   MCV 89.7 03/10/2015 1818   MCV 89.5 02/04/2015 0809   MCH 28.8 03/10/2015 1818   MCH 28.7 02/04/2015 0809   MCHC 32.2 03/10/2015 1818   MCHC 32.1 02/04/2015 0809   RDW 16.1* 03/10/2015 1818   RDW 15.7* 02/04/2015 0809   LYMPHSABS 1.5 03/10/2015 1818   LYMPHSABS 1.2 02/04/2015 0809   MONOABS 0.3 03/10/2015 1818   MONOABS 0.6 02/04/2015 0809   EOSABS 0.1 03/10/2015 1818   EOSABS 0.0 02/04/2015 0809   BASOSABS 0.0  03/10/2015 1818   BASOSABS 0.0 02/04/2015 0809   HEPATIC Function Panel  Recent Labs  08/24/14 1213 02/04/15 0810 03/10/15 1818  PROT 6.8 7.0 6.3*   HEMOGLOBIN A1C No components found for: HGA1C,  MPG CARDIAC ENZYMES Lab Results  Component Value Date   TROPONINI 0.03 03/11/2015   TROPONINI 0.03 03/10/2015   TROPONINI 0.04* 03/10/2015   BNP No results for input(s): PROBNP in the last 8760 hours. TSH  Recent Labs  01/16/15 1611  TSH 1.415   CHOLESTEROL No results for input(s): CHOL in the last 8760 hours.  Scheduled Meds: . aspirin EC  81 mg Oral QHS  . atorvastatin  20 mg Oral QHS  . calcium carbonate  1 tablet Oral Daily  . enoxaparin (LOVENOX) injection  40 mg Subcutaneous Q24H  . furosemide  40 mg Intravenous Q12H  . gemfibrozil  600 mg Oral BID AC  . metoCLOPramide  5 mg Oral TID AC & HS  . pantoprazole  40 mg Oral Daily  . potassium chloride SA  20 mEq Oral TID  . sodium chloride  3 mL Intravenous Q12H   Continuous Infusions:  PRN Meds:.sodium chloride, acetaminophen, ondansetron (ZOFRAN) IV, sodium chloride  Assessment/Plan: Acute left heart systolic failure Hypertension DM, II CAD Iron deficiency anemia Hypokalemia  Continue diuresis. Potassium supplement.   LOS: 1 day    [name]  MD  03/11/2015, 9:53 PM

## 2015-03-11 NOTE — Progress Notes (Signed)
  Echocardiogram 2D Echocardiogram has been performed.  Zaxton Angerer 03/11/2015, 1:28 PM

## 2015-03-12 LAB — BASIC METABOLIC PANEL
Anion gap: 8 (ref 5–15)
BUN: 14 mg/dL (ref 6–20)
CALCIUM: 8.5 mg/dL — AB (ref 8.9–10.3)
CO2: 29 mmol/L (ref 22–32)
CREATININE: 1.11 mg/dL (ref 0.61–1.24)
Chloride: 104 mmol/L (ref 101–111)
GFR calc non Af Amer: 58 mL/min — ABNORMAL LOW (ref >60)
Glucose, Bld: 83 mg/dL (ref 65–99)
Potassium: 3.4 mmol/L — ABNORMAL LOW (ref 3.5–5.1)
SODIUM: 141 mmol/L (ref 135–145)

## 2015-03-12 LAB — GLUCOSE, CAPILLARY: Glucose-Capillary: 85 mg/dL (ref 65–99)

## 2015-03-12 NOTE — Progress Notes (Signed)
CSW received consult that pt son wished to discuss pt care.  CSW spoke with pt son who reports that pt wife is also in the hospital and he is trying to coordinate home discharges for them both- Son wished to speak with someone concerning his fathers home discharge/home health services.  CSW informed RNCM of pt son wish to discuss home needs.  Son had no other CSW needs at this time- CSW signing off  Domenica Reamer, [place] Social Worker [phone]

## 2015-03-12 NOTE — Progress Notes (Signed)
The pt's manual BP was rose to 172/76 this morning and fell to 162/82 after receiving his Lasix.

## 2015-03-12 NOTE — Progress Notes (Signed)
Pt has decided to leave AMA. Risks to leaving AMA explained to pt in full detail including possibility for sudden death. Pt is AOx4 and verbalized understanding but still wishes to sign AMA. AMA papers signed. IV removed. Telemetry removed. Belongings retrieved from security and given back to patient. Patient verbalized all belongings were present when reviewed. Pt wheeled off floor to valet to car.  Maurene Capes RN

## 2015-03-12 NOTE — Progress Notes (Signed)
The patient's manual BP was 192/82.  He was asymptomatic.  Dr. Doylene Canard was notified and new orders were given to administer Losartan and Norvasc.  The RN carried out the orders and will continue to monitor the patient's BP.

## 2015-03-12 NOTE — Progress Notes (Signed)
Received message from SW to contact the son regarding DCP. TCT [name], he is very stressed about having his father here at [facility] and his mother is a patient at [facility] and requesting that I coordinate the care of both patient's. CM informed the son that the CM/SW at [facility] will coordinate the care for his mother and I will coordinate the care for his father that is a patient her at [facility] of emotional support given to the son and [name] stated that he will be in around 1 pm to further discuss the patient's care.  CM talked to patient, he is alert, oriented x 4 and is determined to leave the hospital to see his spouse at [facility]. Dr Doylene Canard called and updated; Dr Doylene Canard spoke to the patient on speaker phone about the importance of staying in the hospital for another night and then if he is stable he will be discharged home. Patient up in room getting dressed, determined to leave.  TCT patient's son [name]; [name] spoke to his father via speaker phone of the importance of staying. Patient stated that he is leaving. Bethany RN, Butch Penny RN and Felicia CNA at bedside. Mindi Slicker [facility] [phone]

## 2015-03-17 ENCOUNTER — Telehealth (HOSPITAL_COMMUNITY): Payer: Self-pay | Admitting: Surgery

## 2015-03-17 NOTE — Telephone Encounter (Signed)
Heart Failure Nurse Navigator Post Discharge Telephone Call  I called to check on Shane [name] after recent hospitalization and the fact that he left AMA after finding out his wife was hospitalized as well.  He says he is doing well--had appt with Dr. Doylene Canard yesterday.  He tells me they discussed his heart --however cannot recall what was said.  He had not weighed yet today--he weighed while on the phone and weight was 140 lbs versus discharge weight of 139.3 lbs on 03/12/15.  He denies SOB or swelling.   He also denies any issues with getting or taking prescribed medications.  He seemed forgetful on the phone and is difficult to get information from.  I encouraged him to call me back with any concerns or questions related to his HF.

## 2015-03-19 ENCOUNTER — Other Ambulatory Visit: Payer: Self-pay | Admitting: Oncology

## 2015-03-21 NOTE — Discharge Summary (Signed)
Physician Discharge Summary  Patient ID: Shane [name] MRN: [id number] DOB/AGE: [birth year] 81 y.o.  Admit date: 03/10/2015 Discharge date: 03/12/2015  Admission Diagnoses: Acute left heart systolic failure Hypertension DM, II CAD  Discharge Diagnoses:  Principal Problem: * Acute left diastolic heart failure (HCC) * Hypertension DM, II CAD Hypertrophic non-obstructive cardiomyopathy Moderate aortic valve stenosis Bilateral pleural effusion Iron deficiency anemia Hypokalemia-improving Osteopenia and osteoporosis  Discharged Condition: Stable  Hospital Course: 81 year old male has 5-10 pound weight gain, leg edema and exertional dyspnea x 1 week. Some chest discomfort. No nausea or sweating spell. No fever or cough. He responded well to IV lasix. He had some personal business to take care along with wife sick and in another hospital hence he signed out AMA but received enough instructions to take medications, diet and activity. He was followed by me in 5 days post discharge and was doing well with no leg edema or shortness of breath.  Consults: Cardiology  Significant Diagnostic Studies:Lab: Mild leukopenia, Hgb of 10.9 g/dL and mild thrombocytopenia. Normal BMET except mild hypokalemia improving with potassium supplementation. Mild hypoalbuminemia. Near normal Troponin-I levels. BNP elevated at 559.5  Chest X-ray: There is small bilateral pleural effusion. Streaky left base retrocardiac atelectasis or infiltrate. Poorly visualized nodular density left lingula measures 1.9 cm. Further correlation with CT scan of the chest is recommended. Osteopenia and degenerative changes thoracic spine.  Echocardiogram: - Left ventricle: The cavity size was normal. There was severe concentric hypertrophy. Systolic function was normal. The estimated ejection fraction was in the range of 50% to 55%. Wall motion was normal; there were no regional wall motionabnormalities. Doppler parameters are  consistent with abnormalleft ventricular relaxation (grade 1 diastolic dysfunction). - Aortic valve: Noncoronary cusp mobility was severely restricted. Cannot exclude vegetation. Transvalvular velocity was increased, due to stenosis. There was moderate stenosis. There was mild regurgitation. Valve area (VTI): 1.22 cm^2. Valve area (Vmax): 1.18 cm^2. Valve area (Vmean): 1.21 cm^2. - Mitral valve: There was mild regurgitation. - Left atrium: The atrium was moderately to severely dilated. - Right ventricle: The cavity size was normal. Wall thickness was mildly increased. - Right atrium: The atrium was moderately dilated. - Pulmonary arteries: Systolic pressure was mildly increased. PA peak pressure: 37 mm Hg (S). - Pericardium, extracardiac: There was a right pleural effusion. There was a left pleural effusion.  Treatments: IV lasix, PO potassium.  Discharge Exam: Blood pressure 155/62, pulse 56, temperature 98.2 F (36.8 C), temperature source Oral, resp. rate 18, height 5\' 8"  (1.727 m), weight 63.186 kg (139 lb 4.8 oz), SpO2 100 %. HEENT: [place]/AT, Eyes-Brown, PERL, EOMI, Conjunctiva-Pink, Sclera-Non-icteric Neck: No JVD, No bruit, Trachea midline. Lungs: Clear, Bilateral. Cardiac: Regular rhythm, normal S1 and S2, no S3. III/VI systolic and II/VI diastolic murmur. Abdomen: Soft, non-tender. Extremities: Trace edema present. No cyanosis. No clubbing. CNS: AxOx3, Cranial nerves grossly intact, moves all 4 extremities. Right handed. Skin: Warm and dry.  Disposition: 07-Left Against Medical Advice     Medication List    ASK your doctor about these medications        alendronate 70 MG tablet  Commonly known as:  FOSAMAX  Take 1 tablet by mouth daily.     aspirin EC 81 MG tablet  Take 81 mg by mouth at bedtime.     atorvastatin 20 MG tablet  Commonly known as:  LIPITOR  Take 20 mg by mouth at bedtime.     calcium carbonate 750 MG chewable tablet  Commonly known as:  TUMS EX   Chew 1 tablet by mouth daily as needed for heartburn.     [employer] SENNA PLUS 8.6-50 MG tablet  Generic drug:  senna-docusate  TAKE 2 TABLETS BY MOUTH 2 (TWO) TIMES DAILY.     erythromycin ophthalmic ointment  Place into the left eye 4 (four) times daily. Apply 1/2 inch to left eye 4 times a day for 5 days.     furosemide 40 MG tablet  Commonly known as:  LASIX  Take 40 mg by mouth daily.     gemfibrozil 600 MG tablet  Commonly known as:  LOPID  Take 600 mg by mouth 2 (two) times daily before a meal.     glipiZIDE 2.5 MG 24 hr tablet  Commonly known as:  GLUCOTROL XL  Take 2.5 mg by mouth daily.     HYDROcodone-acetaminophen 5-325 MG tablet  Commonly known as:  NORCO  Take 1-2 tablets by mouth every 6 (six) hours as needed for moderate pain or severe pain.     HYZAAR 100-25 MG tablet  Generic drug:  losartan-hydrochlorothiazide  Take 0.5 tablets by mouth daily.     ketorolac 0.4 % Soln  Commonly known as:  ACULAR  Place 1 drop into the right eye 4 (four) times daily.     Lactulose 20 GM/30ML Soln  Take 30 mLs (20 g total) by mouth 2 (two) times daily as needed (For constipation).     metoCLOPramide 5 MG tablet  Commonly known as:  REGLAN  Take 5 mg by mouth 4 (four) times daily -  before meals and at bedtime.     ofloxacin 0.3 % ophthalmic solution  Commonly known as:  OCUFLOX  Place 1 drop into the right eye 4 (four) times daily.     omeprazole 20 MG capsule  Commonly known as:  PRILOSEC  Take 20 mg by mouth daily before breakfast.     OVER THE COUNTER MEDICATION  Place 1 drop into the left eye at bedtime as needed (irritation). Over the counter eye drops     potassium chloride 10 MEQ tablet  Commonly known as:  K-DUR  Take 10 mEq by mouth daily.     prednisoLONE acetate 1 % ophthalmic suspension  Commonly known as:  PRED FORTE  Place 1 drop into the right eye 4 (four) times daily.     predniSONE 5 MG tablet  Commonly known as:  DELTASONE  TAKE 1 TABLET BY  MOUTH DAILY     sulindac 200 MG tablet  Commonly known as:  CLINORIL  Take 200 mg by mouth daily.     vitamin C 500 MG tablet  Commonly known as:  ASCORBIC ACID  Take 500 mg by mouth daily.     VITAMIN D PO  Take 1 tablet by mouth daily.         SignedDixie Dials S 03/21/2015, 9:06 PM

## 2015-04-01 ENCOUNTER — Other Ambulatory Visit: Payer: Medicare Other

## 2015-04-01 ENCOUNTER — Ambulatory Visit: Payer: Medicare Other | Admitting: Oncology

## 2015-04-02 ENCOUNTER — Ambulatory Visit (HOSPITAL_BASED_OUTPATIENT_CLINIC_OR_DEPARTMENT_OTHER): Payer: Medicare Other | Admitting: Oncology

## 2015-04-02 ENCOUNTER — Other Ambulatory Visit (HOSPITAL_BASED_OUTPATIENT_CLINIC_OR_DEPARTMENT_OTHER): Payer: Medicare Other

## 2015-04-02 ENCOUNTER — Telehealth: Payer: Self-pay | Admitting: Oncology

## 2015-04-02 ENCOUNTER — Encounter: Payer: Self-pay | Admitting: *Deleted

## 2015-04-02 VITALS — BP 128/48 | HR 83 | Temp 98.2°F | Resp 17 | Ht 68.0 in | Wt 137.5 lb

## 2015-04-02 DIAGNOSIS — C61 Malignant neoplasm of prostate: Secondary | ICD-10-CM

## 2015-04-02 LAB — CBC WITH DIFFERENTIAL/PLATELET
BASO%: 0.3 % (ref 0.0–2.0)
BASOS ABS: 0 10*3/uL (ref 0.0–0.1)
EOS%: 2.1 % (ref 0.0–7.0)
Eosinophils Absolute: 0.1 10*3/uL (ref 0.0–0.5)
HEMATOCRIT: 33.9 % — AB (ref 38.4–49.9)
HGB: 11.1 g/dL — ABNORMAL LOW (ref 13.0–17.1)
LYMPH%: 35.3 % (ref 14.0–49.0)
MCH: 28.9 pg (ref 27.2–33.4)
MCHC: 32.7 g/dL (ref 32.0–36.0)
MCV: 88.3 fL (ref 79.3–98.0)
MONO#: 0.5 10*3/uL (ref 0.1–0.9)
MONO%: 14.1 % — ABNORMAL HIGH (ref 0.0–14.0)
NEUT#: 1.8 10*3/uL (ref 1.5–6.5)
NEUT%: 48.2 % (ref 39.0–75.0)
PLATELETS: 84 10*3/uL — AB (ref 140–400)
RBC: 3.84 10*6/uL — AB (ref 4.20–5.82)
RDW: 14.5 % (ref 11.0–14.6)
WBC: 3.8 10*3/uL — ABNORMAL LOW (ref 4.0–10.3)
lymph#: 1.3 10*3/uL (ref 0.9–3.3)
nRBC: 0 % (ref 0–0)

## 2015-04-02 LAB — COMPREHENSIVE METABOLIC PANEL
ALT: 10 U/L (ref 0–55)
ANION GAP: 9 meq/L (ref 3–11)
AST: 20 U/L (ref 5–34)
Albumin: 3.2 g/dL — ABNORMAL LOW (ref 3.5–5.0)
Alkaline Phosphatase: 83 U/L (ref 40–150)
BILIRUBIN TOTAL: 0.71 mg/dL (ref 0.20–1.20)
BUN: 36.9 mg/dL — ABNORMAL HIGH (ref 7.0–26.0)
CO2: 33 meq/L — AB (ref 22–29)
CREATININE: 1.5 mg/dL — AB (ref 0.7–1.3)
Calcium: 9 mg/dL (ref 8.4–10.4)
Chloride: 101 mEq/L (ref 98–109)
EGFR: 48 mL/min/{1.73_m2} — ABNORMAL LOW (ref >90)
GLUCOSE: 101 mg/dL (ref 70–140)
Potassium: 2.7 mEq/L — CL (ref 3.5–5.1)
SODIUM: 143 meq/L (ref 136–145)
TOTAL PROTEIN: 7.2 g/dL (ref 6.4–8.3)

## 2015-04-02 NOTE — Progress Notes (Signed)
Called patient's home and spoke with son curtis. Patient's potassium is very low. Per dr Alen Blew, Please make sure patient is taking his 10 meq of k-dur daily. [name] verbalized understanding.

## 2015-04-02 NOTE — Telephone Encounter (Signed)
GAVE PATIENT AVS REPORT AND APPOINTMENTS FOR January. [facility] WILL CALL RE SCAN APPOINTMENTS - PATIENT AWARE.

## 2015-04-02 NOTE — Progress Notes (Signed)
Hematology and Oncology Follow Up Visit  Shane [name] [id number] June 23, 1926 81 y.o. 04/02/2015 1:40 PM   Principle Diagnosis:This is an 81 year old gentleman with prostate cancer diagnosed in 39.  He currently has metastatic disease that is asymptomatic.  Prior Therapy: 1. Status post prostatectomy and lymphadenectomy.  He had involvement of the seminal vesicles. 2. Patient treated with adjuvant radiation therapy. 3. Patient developed recurrent disease treated with Lupron and subsequently with Lupron and Casodex and Casodex withdrawal. 4. Patient treated with second-line hormonal manipulation, including ketoconazole, prednisone and subsequently had relapse with PSA up to 47. 5. Received Provenge immunotherapy.  He had treated with 3 Provenge infusions, the last of which was given on 12/17/2010.    Current therapy: Currently on hormonal deprivation with Lupron30 mg as needed if his testosterone is more than 20.  Zytiga 1000 mg daily with Prednisone beginning in April 2013.  He continues to have excellent response with PSA.  Interim History:  Mr. Havel presents today for a followup visit by himself. Since the last visit, he was hospitalized to briefly between 11/29 and 03/12/2015 for congestive heart failure. Since his discharge, his been doing relatively well. He continues to live independently and care for himself and his wife was also was hospitalized recently. He continues to drive and have not noted any major decline in his health.  He  continues on Zytiga without any new side effects. He does have occasional constipation or laxative seems to help at this time.Marland Kitchen He does not report any abdominal pain, nausea, or vomiting. He lost some weight since the last visit related to his recent hospitalization.   He did not report chest pain or difficulty breathing. Did not report any genitourinary complaints.  Performance status and activity level remain at baseline.  Is not reporting any lower  extremity edema.  He has not reported any constitutional symptoms or any complications related to prednisone. He denies any frequency urgency or hesitancy. Does not report any skeletal complaints of arthralgias or myalgias. Does not report any lymphadenopathy or petechiae. Rest of his review of systems unremarkable.  Medications: I have reviewed the patient's current medications.  Current Outpatient Prescriptions  Medication Sig Dispense Refill  . alendronate (FOSAMAX) 70 MG tablet Take 1 tablet by mouth daily.  5  . aspirin EC 81 MG tablet Take 81 mg by mouth at bedtime.    Marland Kitchen atorvastatin (LIPITOR) 20 MG tablet Take 20 mg by mouth at bedtime.     . calcium carbonate (TUMS EX) 750 MG chewable tablet Chew 1 tablet by mouth daily as needed for heartburn.     . Cholecalciferol (VITAMIN D PO) Take 1 tablet by mouth daily.    . CVS SENNA PLUS 8.6-50 MG per tablet TAKE 2 TABLETS BY MOUTH 2 (TWO) TIMES DAILY. 180 tablet 2  . erythromycin ophthalmic ointment Place into the left eye 4 (four) times daily. Apply 1/2 inch to left eye 4 times a day for 5 days. (Patient taking differently: Place 1 application into the right eye daily as needed (irritation). ) 3.5 g 0  . furosemide (LASIX) 40 MG tablet Take 40 mg by mouth daily.    Marland Kitchen gemfibrozil (LOPID) 600 MG tablet Take 600 mg by mouth 2 (two) times daily before a meal.    . glipiZIDE (GLUCOTROL XL) 2.5 MG 24 hr tablet Take 2.5 mg by mouth daily.  6  . HYDROcodone-acetaminophen (NORCO) 5-325 MG per tablet Take 1-2 tablets by mouth every 6 (six) hours as needed for  moderate pain or severe pain. 30 tablet 0  . ketorolac (ACULAR) 0.4 % SOLN Place 1 drop into the right eye 4 (four) times daily.    . Lactulose 20 GM/30ML SOLN Take 30 mLs (20 g total) by mouth 2 (two) times daily as needed (For constipation). 500 mL 2  . losartan-hydrochlorothiazide (HYZAAR) 100-25 MG per tablet Take 0.5 tablets by mouth daily.     . metoCLOPramide (REGLAN) 5 MG tablet Take 5 mg by  mouth 4 (four) times daily -  before meals and at bedtime.   3  . ofloxacin (OCUFLOX) 0.3 % ophthalmic solution Place 1 drop into the right eye 4 (four) times daily.    Marland Kitchen omeprazole (PRILOSEC) 20 MG capsule Take 20 mg by mouth daily before breakfast.     . OVER THE COUNTER MEDICATION Place 1 drop into the left eye at bedtime as needed (irritation). Over the counter eye drops    . potassium chloride (K-DUR) 10 MEQ tablet Take 10 mEq by mouth daily.    . prednisoLONE acetate (PRED FORTE) 1 % ophthalmic suspension Place 1 drop into the right eye 4 (four) times daily.    . predniSONE (DELTASONE) 5 MG tablet TAKE 1 TABLET BY MOUTH DAILY 90 tablet 0  . sulindac (CLINORIL) 200 MG tablet Take 200 mg by mouth daily.     . vitamin C (ASCORBIC ACID) 500 MG tablet Take 500 mg by mouth daily.    Marland Kitchen ZYTIGA 250 MG tablet TAKE 4 TABLETS BY MOUTH DAILY ON AN EMPTY STOMACH 1 HOUR BEFORE OR 2 HOURS AFTER A MEAL 120 tablet 5   No current facility-administered medications for this visit.     Allergies:  Allergies  Allergen Reactions  . Lisinopril     cough    Past Medical History, Surgical history, Social history, and Family History were reviewed and updated.    Physical Exam: Blood pressure 128/48, pulse 83, temperature 98.2 F (36.8 C), temperature source Oral, resp. rate 17, height 5\' 8"  (1.727 m), weight 137 lb 8 oz (62.37 kg), SpO2 100 %. ECOG: 1 General appearance: alert frail gentleman without distress. Head: Normocephalic, without obvious abnormality no oral thrush noted. Neck: no adenopathy Lymph nodes: Cervical, supraclavicular, and axillary nodes normal. Heart:regular rate and rhythm, S1, S2 normal, no murmur, click, rub or gallop Lung:chest clear, no wheezing, rales, normal symmetric air entry Abdomen: soft, non-tender, without masses or organomegaly no ascites. EXT:no erythema, induration, or nodules Neuro: no deficits noted.   Lab Results: Lab Results  Component Value Date   WBC  3.8* 04/02/2015   HGB 11.1* 04/02/2015   HCT 33.9* 04/02/2015   MCV 88.3 04/02/2015   PLT 84* 04/02/2015     Results for Kruser, ABIJAH (MRN [id number]) as of 04/02/2015 13:23  Ref. Range 04/30/2014 13:51 06/24/2014 12:21 02/04/2015 08:10  PSA Latest Ref Range: <=4.00 ng/mL 0.40 0.56 3.94        Impression and Plan: This is a pleasant 81 year old gentleman with the following issues: 1. Castration-resistant prostate cancer.  He is status post Provenge immunotherapy. He is currently on Zytiga and he is tolerating it well. His PSA last checked in October 2016 showed an increase up to 3.96. He is asymptomatic at this time but certainly could be developing cancer progression. Also the rise in the PSA could be related to interrupted treatment because of hospitalizations. The plan is to repeat his PSA as well as staging workup including CT scan and a bone scan within the next  3-4 weeks. As long as his PSA under control, we'll keep him on this medication and use a different salvage agent upon symptomatic progression. 2. Androgen deprivation: His last testosterone on 04/2014 indicated a castrate level. This will be checked periodically and institute androgen deprivation if he develops a rise in his testosterone. 3. Hypertension: His blood pressure is under control and medications have been adjusted. Follows up with cardiology. 4. Hypokalemia: Potassium level is pending from today but was borderline on December 1 of 2016. 5. Constipation: doing better from this stand point. MiraLAX seems to have helped his symptoms. 6. Follow-up: In 4 weeks.  [id number] 12/22/20161:40 PM

## 2015-04-03 ENCOUNTER — Telehealth: Payer: Self-pay | Admitting: *Deleted

## 2015-04-03 LAB — PSA: PSA: 7.64 ng/mL — ABNORMAL HIGH (ref <=4.00)

## 2015-04-03 NOTE — Telephone Encounter (Signed)
Patient called asking how to take his medication Zytiga with Readi-cat 2 Barium Sulfate Suspension.  Asked when is he scheduled for scans and he does not know.  Advised he ask radiology department how to take this when scans are scheduled.  He shared the Central scheduling number provided on this bottle.   Caleld son to notify him of this call.

## 2015-04-08 ENCOUNTER — Telehealth: Payer: Self-pay | Admitting: Oncology

## 2015-04-08 ENCOUNTER — Encounter: Payer: Self-pay | Admitting: *Deleted

## 2015-04-08 NOTE — Telephone Encounter (Signed)
Manuela Schwartz from [facility] central scheduling left message re moving patient 1/24 lab to be scans on 1/24. Moved lab to 9:30 am - left message for patient and mailed new schedule.

## 2015-04-12 ENCOUNTER — Inpatient Hospital Stay (HOSPITAL_COMMUNITY): Payer: Medicare Other

## 2015-04-12 ENCOUNTER — Inpatient Hospital Stay (HOSPITAL_COMMUNITY)
Admission: EM | Admit: 2015-04-12 | Discharge: 2015-04-26 | DRG: 177 | Disposition: A | Discharge status: Hospice | Payer: Medicare Other | Attending: Cardiovascular Disease | Admitting: Cardiovascular Disease

## 2015-04-12 ENCOUNTER — Emergency Department (HOSPITAL_COMMUNITY): Payer: Medicare Other

## 2015-04-12 ENCOUNTER — Encounter (HOSPITAL_COMMUNITY): Payer: Self-pay

## 2015-04-12 DIAGNOSIS — Z961 Presence of intraocular lens: Secondary | ICD-10-CM | POA: Diagnosis present

## 2015-04-12 DIAGNOSIS — R4701 Aphasia: Secondary | ICD-10-CM | POA: Diagnosis present

## 2015-04-12 DIAGNOSIS — Z681 Body mass index (BMI) 19 or less, adult: Secondary | ICD-10-CM | POA: Diagnosis not present

## 2015-04-12 DIAGNOSIS — I471 Supraventricular tachycardia: Secondary | ICD-10-CM | POA: Diagnosis not present

## 2015-04-12 DIAGNOSIS — Z66 Do not resuscitate: Secondary | ICD-10-CM | POA: Diagnosis present

## 2015-04-12 DIAGNOSIS — K219 Gastro-esophageal reflux disease without esophagitis: Secondary | ICD-10-CM | POA: Diagnosis present

## 2015-04-12 DIAGNOSIS — J189 Pneumonia, unspecified organism: Secondary | ICD-10-CM

## 2015-04-12 DIAGNOSIS — G929 Unspecified toxic encephalopathy: Secondary | ICD-10-CM

## 2015-04-12 DIAGNOSIS — Z8673 Personal history of transient ischemic attack (TIA), and cerebral infarction without residual deficits: Secondary | ICD-10-CM

## 2015-04-12 DIAGNOSIS — N390 Urinary tract infection, site not specified: Secondary | ICD-10-CM | POA: Diagnosis present

## 2015-04-12 DIAGNOSIS — Z9842 Cataract extraction status, left eye: Secondary | ICD-10-CM | POA: Diagnosis not present

## 2015-04-12 DIAGNOSIS — R4182 Altered mental status, unspecified: Secondary | ICD-10-CM | POA: Diagnosis present

## 2015-04-12 DIAGNOSIS — Z515 Encounter for palliative care: Secondary | ICD-10-CM | POA: Diagnosis present

## 2015-04-12 DIAGNOSIS — I110 Hypertensive heart disease with heart failure: Secondary | ICD-10-CM | POA: Diagnosis present

## 2015-04-12 DIAGNOSIS — E11649 Type 2 diabetes mellitus with hypoglycemia without coma: Secondary | ICD-10-CM | POA: Diagnosis present

## 2015-04-12 DIAGNOSIS — Z9841 Cataract extraction status, right eye: Secondary | ICD-10-CM

## 2015-04-12 DIAGNOSIS — E785 Hyperlipidemia, unspecified: Secondary | ICD-10-CM | POA: Diagnosis present

## 2015-04-12 DIAGNOSIS — I2510 Atherosclerotic heart disease of native coronary artery without angina pectoris: Secondary | ICD-10-CM | POA: Diagnosis present

## 2015-04-12 DIAGNOSIS — E876 Hypokalemia: Secondary | ICD-10-CM | POA: Diagnosis present

## 2015-04-12 DIAGNOSIS — E162 Hypoglycemia, unspecified: Secondary | ICD-10-CM

## 2015-04-12 DIAGNOSIS — G92 Toxic encephalopathy: Secondary | ICD-10-CM

## 2015-04-12 DIAGNOSIS — G9340 Encephalopathy, unspecified: Secondary | ICD-10-CM | POA: Diagnosis not present

## 2015-04-12 DIAGNOSIS — Z4659 Encounter for fitting and adjustment of other gastrointestinal appliance and device: Secondary | ICD-10-CM

## 2015-04-12 DIAGNOSIS — Z888 Allergy status to other drugs, medicaments and biological substances status: Secondary | ICD-10-CM

## 2015-04-12 DIAGNOSIS — Z8546 Personal history of malignant neoplasm of prostate: Secondary | ICD-10-CM | POA: Diagnosis not present

## 2015-04-12 DIAGNOSIS — J690 Pneumonitis due to inhalation of food and vomit: Principal | ICD-10-CM | POA: Diagnosis present

## 2015-04-12 DIAGNOSIS — G9341 Metabolic encephalopathy: Secondary | ICD-10-CM | POA: Diagnosis present

## 2015-04-12 DIAGNOSIS — Z87891 Personal history of nicotine dependence: Secondary | ICD-10-CM

## 2015-04-12 DIAGNOSIS — E43 Unspecified severe protein-calorie malnutrition: Secondary | ICD-10-CM | POA: Diagnosis present

## 2015-04-12 DIAGNOSIS — R400 Somnolence: Secondary | ICD-10-CM | POA: Diagnosis not present

## 2015-04-12 DIAGNOSIS — E870 Hyperosmolality and hypernatremia: Secondary | ICD-10-CM | POA: Diagnosis present

## 2015-04-12 DIAGNOSIS — A419 Sepsis, unspecified organism: Secondary | ICD-10-CM

## 2015-04-12 DIAGNOSIS — I5022 Chronic systolic (congestive) heart failure: Secondary | ICD-10-CM | POA: Diagnosis present

## 2015-04-12 DIAGNOSIS — I509 Heart failure, unspecified: Secondary | ICD-10-CM

## 2015-04-12 LAB — CBC WITH DIFFERENTIAL/PLATELET
Basophils Absolute: 0 10*3/uL (ref 0.0–0.1)
Basophils Relative: 0 %
Eosinophils Absolute: 0 10*3/uL (ref 0.0–0.7)
Eosinophils Relative: 0 %
HCT: 39.3 % (ref 39.0–52.0)
Hemoglobin: 13.2 g/dL (ref 13.0–17.0)
Lymphocytes Relative: 7 %
Lymphs Abs: 0.4 10*3/uL — ABNORMAL LOW (ref 0.7–4.0)
MCH: 29.5 pg (ref 26.0–34.0)
MCHC: 33.6 g/dL (ref 30.0–36.0)
MCV: 87.7 fL (ref 78.0–100.0)
Monocytes Absolute: 0.4 10*3/uL (ref 0.1–1.0)
Monocytes Relative: 7 %
Neutro Abs: 5.3 10*3/uL (ref 1.7–7.7)
Neutrophils Relative %: 86 %
Platelets: 78 10*3/uL — ABNORMAL LOW (ref 150–400)
RBC: 4.48 MIL/uL (ref 4.22–5.81)
RDW: 14.8 % (ref 11.5–15.5)
WBC: 6.2 10*3/uL (ref 4.0–10.5)

## 2015-04-12 LAB — URINE MICROSCOPIC-ADD ON: Squamous Epithelial / LPF: NONE SEEN

## 2015-04-12 LAB — BASIC METABOLIC PANEL
Anion gap: 10 (ref 5–15)
BUN: 21 mg/dL — ABNORMAL HIGH (ref 6–20)
CO2: 25 mmol/L (ref 22–32)
Calcium: 8.8 mg/dL — ABNORMAL LOW (ref 8.9–10.3)
Chloride: 104 mmol/L (ref 101–111)
Creatinine, Ser: 1.36 mg/dL — ABNORMAL HIGH (ref 0.61–1.24)
GFR calc Af Amer: 53 mL/min — ABNORMAL LOW (ref >60)
GFR calc non Af Amer: 45 mL/min — ABNORMAL LOW (ref >60)
Glucose, Bld: 73 mg/dL (ref 65–99)
Potassium: 3.4 mmol/L — ABNORMAL LOW (ref 3.5–5.1)
Sodium: 139 mmol/L (ref 135–145)

## 2015-04-12 LAB — URINALYSIS, ROUTINE W REFLEX MICROSCOPIC
Bilirubin Urine: NEGATIVE
Glucose, UA: NEGATIVE mg/dL
Ketones, ur: NEGATIVE mg/dL
Nitrite: POSITIVE — AB
Protein, ur: NEGATIVE mg/dL
Specific Gravity, Urine: 1.013 (ref 1.005–1.030)
pH: 6 (ref 5.0–8.0)

## 2015-04-12 LAB — CBG MONITORING, ED: Glucose-Capillary: 68 mg/dL (ref 65–99)

## 2015-04-12 LAB — GLUCOSE, CAPILLARY
Glucose-Capillary: 130 mg/dL — ABNORMAL HIGH (ref 65–99)
Glucose-Capillary: 131 mg/dL — ABNORMAL HIGH (ref 65–99)

## 2015-04-12 LAB — TROPONIN I: Troponin I: <0.03 ng/mL (ref <0.031)

## 2015-04-12 LAB — LACTIC ACID, PLASMA
Lactic Acid, Venous: 3 mmol/L (ref 0.5–2.0)
Lactic Acid, Venous: 3.9 mmol/L (ref 0.5–2.0)

## 2015-04-12 LAB — BRAIN NATRIURETIC PEPTIDE: B Natriuretic Peptide: 292.1 pg/mL — ABNORMAL HIGH (ref 0.0–100.0)

## 2015-04-12 LAB — MRSA PCR SCREENING: MRSA BY PCR: NEGATIVE

## 2015-04-12 MED ORDER — ENOXAPARIN SODIUM 40 MG/0.4ML ~~LOC~~ SOLN
40.0000 mg | SUBCUTANEOUS | Status: DC
  Administered 2015-04-13 – 2015-04-16 (×4): 40 mg via SUBCUTANEOUS
  Filled 2015-04-12 (×4): qty 0.4

## 2015-04-12 MED ORDER — ACETAMINOPHEN 650 MG RE SUPP
650.0000 mg | Freq: Once | RECTAL | Status: AC
  Administered 2015-04-12: 650 mg via RECTAL
  Filled 2015-04-12: qty 1

## 2015-04-12 MED ORDER — ALBUTEROL SULFATE (2.5 MG/3ML) 0.083% IN NEBU
2.5000 mg | INHALATION_SOLUTION | Freq: Four times a day (QID) | RESPIRATORY_TRACT | Status: DC
  Administered 2015-04-12 (×2): 2.5 mg via RESPIRATORY_TRACT
  Filled 2015-04-12 (×2): qty 3

## 2015-04-12 MED ORDER — DEXTROSE 5 % IV SOLN
1.0000 g | Freq: Once | INTRAVENOUS | Status: AC
  Administered 2015-04-12: 1 g via INTRAVENOUS
  Filled 2015-04-12: qty 10

## 2015-04-12 MED ORDER — SODIUM CHLORIDE 0.9 % IV SOLN: INTRAVENOUS | Status: DC

## 2015-04-12 MED ORDER — ALBUTEROL SULFATE (2.5 MG/3ML) 0.083% IN NEBU
2.5000 mg | INHALATION_SOLUTION | Freq: Three times a day (TID) | RESPIRATORY_TRACT | Status: DC
  Administered 2015-04-13 – 2015-04-22 (×26): 2.5 mg via RESPIRATORY_TRACT
  Filled 2015-04-12 (×26): qty 3

## 2015-04-12 MED ORDER — DEXTROSE 5 % IV SOLN
500.0000 mg | Freq: Once | INTRAVENOUS | Status: AC
  Administered 2015-04-12: 500 mg via INTRAVENOUS
  Filled 2015-04-12: qty 500

## 2015-04-12 MED ORDER — INSULIN ASPART 100 UNIT/ML ~~LOC~~ SOLN: 0.0000 [IU] | Freq: Three times a day (TID) | SUBCUTANEOUS | Status: DC

## 2015-04-12 MED ORDER — DEXTROSE-NACL 5-0.45 % IV SOLN
INTRAVENOUS | Status: DC
  Administered 2015-04-12: 15:00:00 via INTRAVENOUS
  Administered 2015-04-13: 50 mL/h via INTRAVENOUS
  Administered 2015-04-14: 08:00:00 via INTRAVENOUS

## 2015-04-12 MED ORDER — DEXTROSE 5 % IV SOLN
1.0000 g | INTRAVENOUS | Status: DC
  Filled 2015-04-12: qty 10

## 2015-04-12 MED ORDER — DEXTROSE 50 % IV SOLN
12.5000 g | Freq: Once | INTRAVENOUS | Status: AC
  Administered 2015-04-12: 12.5 g via INTRAVENOUS

## 2015-04-12 MED ORDER — DEXTROSE 50 % IV SOLN
12.5000 g | Freq: Once | INTRAVENOUS | Status: AC
  Administered 2015-04-12: 12.5 g via INTRAVENOUS
  Filled 2015-04-12: qty 50

## 2015-04-12 MED ORDER — SODIUM CHLORIDE 0.9 % IV BOLUS (SEPSIS)
1000.0000 mL | Freq: Once | INTRAVENOUS | Status: AC
  Administered 2015-04-12: 1000 mL via INTRAVENOUS

## 2015-04-12 MED ORDER — DEXTROSE 50 % IV SOLN
INTRAVENOUS | Status: AC
Start: 2015-04-12 — End: 2015-04-13
  Filled 2015-04-12: qty 50

## 2015-04-12 NOTE — ED Notes (Signed)
CBG 68

## 2015-04-12 NOTE — ED Provider Notes (Signed)
CSN: [id number]     Arrival date & time 04/12/15  1028 History   First MD Initiated Contact with Patient 04/12/15 1029     Chief Complaint  Patient presents with  . Altered Mental Status     (Consider location/radiation/quality/duration/timing/severity/associated sxs/prior Treatment) HPI   82 year old male brought in by EMS. He was found by family this morning poorly respsnsive. Last seen sometime around 7 PM yesterday in his usual state of health. On scene his glucose is 32. He was given dextrose without significant improvement of his mental status despite improve of blood sugar. No reported history of trauma or overdose. Review of records, specifically neurology note from this past November, mentions that patient normally independently takes care of his ADLs and prepares his own bills. Currently he is only responsive to noxious stimuli. He is not responding to questioning or following commands. He withdraws all extremities to pain.   Past Medical History  Diagnosis Date  . Hypertension   . Hyperlipidemia   . Hyperthyroidism   . Anxiety   . Dysrhythmia   . Heart murmur   . Constipation   . GERD (gastroesophageal reflux disease)   . Memory difficulties 10/10/2014  . Type II diabetes mellitus ([place])   . CAD (coronary artery disease)     08/25/03 cath [facility]): NL coronaries, EF 70-75%   . Arthritis     "back, hips" (03/10/2015)  . History of gout   . Prostate CA [facility])     prostate ca dx 20 yrs ago   Past Surgical History  Procedure Laterality Date  . Appendectomy    . Penile prosthesis implant    . Colonoscopy    . Prostatectomy    . Inguinal hernia repair Right 07/14/2014    Procedure: OPEN REPAIR RIGHT INGUINAL HERNIA ;  Surgeon: Fanny Skates, MD;  Location: [place];  Service: General;  Laterality: Right;  . Insertion of mesh Right 07/14/2014    Procedure: INSERTION OF MESH;  Surgeon: Fanny Skates, MD;  Location: [place];  Service: General;  Laterality: Right;  . Laparotomy N/A  08/24/2014    Procedure: EXPLORATORY LAPAROTOMY, RESECTION SIGMOID COLON, COLOSTOMY;  Surgeon: Jackolyn Confer, MD;  Location[facility];  Service: General;  Laterality: N/A;  . Colostomy  08/24/2014  . Cardiac catheterization  08/25/2003    [facility]): NL coronaries, EF 70-75%/notes 03/10/2015  . Inguinal hernia repair Left 07/14/2014    with mesh  . Cataract extraction w/ intraocular lens implant Left   . Colon surgery     Family History  Problem Relation Age of Onset  . CVA Mother   . Alcohol abuse Brother   . Dementia Sister    Social History  Substance Use Topics  . Smoking status: Former Smoker -- 1.00 packs/day for 45 years    Types: Cigarettes  . Smokeless tobacco: Former Systems developer    Types: Chew     Comment: 03/10/2015 "quit smoking in the 1990's; quit chewing when I was in my 24's"  . Alcohol Use: No    Review of Systems  Level 5 caveat because pt is poorly responsive.   Allergies  Lisinopril  Home Medications   Prior to Admission medications   Medication Sig Start Date End Date Taking? Authorizing Provider  alendronate (FOSAMAX) 70 MG tablet Take 1 tablet by mouth daily. 02/09/15   Historical Provider, MD  aspirin EC 81 MG tablet Take 81 mg by mouth at bedtime.    Historical Provider, MD  atorvastatin (LIPITOR) 20 MG  tablet Take 20 mg by mouth at bedtime.     Historical Provider, MD  calcium carbonate (TUMS EX) 750 MG chewable tablet Chew 1 tablet by mouth daily as needed for heartburn.     Historical Provider, MD  Cholecalciferol (VITAMIN D PO) Take 1 tablet by mouth daily.    Historical Provider, MD  [facility] SENNA PLUS 8.6-50 MG per tablet TAKE 2 TABLETS BY MOUTH 2 (TWO) TIMES DAILY. 06/21/14   Wyatt Portela, MD  erythromycin ophthalmic ointment Place into the left eye 4 (four) times daily. Apply 1/2 inch to left eye 4 times a day for 5 days. Patient taking differently: Place 1 application into the right eye daily as needed (irritation).  09/17/12   Maryanna Shape, NP   furosemide (LASIX) 40 MG tablet Take 40 mg by mouth daily.    Historical Provider, MD  gemfibrozil (LOPID) 600 MG tablet Take 600 mg by mouth 2 (two) times daily before a meal.    Historical Provider, MD  glipiZIDE (GLUCOTROL XL) 2.5 MG 24 hr tablet Take 2.5 mg by mouth daily. 06/24/14   Historical Provider, MD  HYDROcodone-acetaminophen (NORCO) 5-325 MG per tablet Take 1-2 tablets by mouth every 6 (six) hours as needed for moderate pain or severe pain. 08/30/14   Stark Klein, MD  ketorolac (ACULAR) 0.4 % SOLN Place 1 drop into the right eye 4 (four) times daily.    Historical Provider, MD  Lactulose 20 GM/30ML SOLN Take 30 mLs (20 g total) by mouth 2 (two) times daily as needed (For constipation). 10/16/13   Maryanna Shape, NP  losartan-hydrochlorothiazide (HYZAAR) 100-25 MG per tablet Take 0.5 tablets by mouth daily.     Historical Provider, MD  metoCLOPramide (REGLAN) 5 MG tablet Take 5 mg by mouth 4 (four) times daily -  before meals and at bedtime.  05/27/14   Historical Provider, MD  ofloxacin (OCUFLOX) 0.3 % ophthalmic solution Place 1 drop into the right eye 4 (four) times daily.    Historical Provider, MD  omeprazole (PRILOSEC) 20 MG capsule Take 20 mg by mouth daily before breakfast.     Historical Provider, MD  OVER THE COUNTER MEDICATION Place 1 drop into the left eye at bedtime as needed (irritation). Over the counter eye drops    Historical Provider, MD  potassium chloride (K-DUR) 10 MEQ tablet Take 10 mEq by mouth daily.    Historical Provider, MD  prednisoLONE acetate (PRED FORTE) 1 % ophthalmic suspension Place 1 drop into the right eye 4 (four) times daily.    Historical Provider, MD  predniSONE (DELTASONE) 5 MG tablet TAKE 1 TABLET BY MOUTH DAILY 07/23/14   Wyatt Portela, MD  sulindac (CLINORIL) 200 MG tablet Take 200 mg by mouth daily.     Historical Provider, MD  vitamin C (ASCORBIC ACID) 500 MG tablet Take 500 mg by mouth daily.    Historical Provider, MD  ZYTIGA 250 MG tablet  TAKE 4 TABLETS BY MOUTH DAILY ON AN EMPTY STOMACH 1 HOUR BEFORE OR 2 HOURS AFTER A MEAL 03/19/15   Wyatt Portela, MD   BP 153/64 mmHg  Pulse 84  Resp 19 Physical Exam  Constitutional: He appears well-developed.  HENT:  Head: Normocephalic and atraumatic.  Eyes: Conjunctivae are normal. Right eye exhibits no discharge. Left eye exhibits no discharge.  Neck: Neck supple.  Cardiovascular: Normal rate, regular rhythm and normal heart sounds.  Exam reveals no gallop and no friction rub.   No murmur heard.  Pulmonary/Chest: Effort normal and breath sounds normal. No respiratory distress.  Abdominal: Soft. He exhibits no distension. There is no tenderness.  Musculoskeletal: He exhibits no edema or tenderness.  No external sings of acute trauma.   Neurological:  Laying in bed with eyes closed. Will wince with sternal rub. Withdraws all extremities to pain with more brisk response in LE. No inducible clonus. Not following commands. Face seems symmetric. Not verbalizing.   Skin: Skin is warm and dry.  Nursing note and vitals reviewed.   ED Course  Procedures (including critical care time) Labs Review Labs Reviewed  CBC WITH DIFFERENTIAL/PLATELET - Abnormal; Notable for the following:    Platelets 78 (*)    Lymphs Abs 0.4 (*)    All other components within normal limits  BASIC METABOLIC PANEL - Abnormal; Notable for the following:    Potassium 3.4 (*)    BUN 21 (*)    Creatinine, Ser 1.36 (*)    Calcium 8.8 (*)    GFR calc non Af Amer 45 (*)    GFR calc Af Amer 53 (*)    All other components within normal limits  URINALYSIS, ROUTINE W REFLEX MICROSCOPIC (NOT AT [facility]) - Abnormal; Notable for the following:    APPearance CLOUDY (*)    Hgb urine dipstick MODERATE (*)    Nitrite POSITIVE (*)    Leukocytes, UA LARGE (*)    All other components within normal limits  LACTIC ACID, PLASMA - Abnormal; Notable for the following:    Lactic Acid, Venous 3.0 (*)    All other components within  normal limits  LACTIC ACID, PLASMA - Abnormal; Notable for the following:    Lactic Acid, Venous 3.9 (*)    All other components within normal limits  BRAIN NATRIURETIC PEPTIDE - Abnormal; Notable for the following:    B Natriuretic Peptide 292.1 (*)    All other components within normal limits  URINE MICROSCOPIC-ADD ON - Abnormal; Notable for the following:    Bacteria, UA MANY (*)    All other components within normal limits  GLUCOSE, CAPILLARY - Abnormal; Notable for the following:    Glucose-Capillary 130 (*)    All other components within normal limits  BASIC METABOLIC PANEL - Abnormal; Notable for the following:    Potassium 3.1 (*)    Glucose, Bld 145 (*)    Calcium 7.9 (*)    GFR calc non Af Amer 51 (*)    GFR calc Af Amer 59 (*)    All other components within normal limits  CBC - Abnormal; Notable for the following:    WBC 10.8 (*)    RBC 4.13 (*)    Hemoglobin 11.7 (*)    HCT 35.6 (*)    Platelets 75 (*)    All other components within normal limits  GLUCOSE, CAPILLARY - Abnormal; Notable for the following:    Glucose-Capillary 131 (*)    All other components within normal limits  PROTEIN AND GLUCOSE, CSF - Abnormal; Notable for the following:    Glucose, CSF 78 (*)    All other components within normal limits  CSF CELL COUNT WITH DIFFERENTIAL - Abnormal; Notable for the following:    RBC Count, CSF 52 (*)    All other components within normal limits  GLUCOSE, CAPILLARY - Abnormal; Notable for the following:    Glucose-Capillary 133 (*)    All other components within normal limits  GLUCOSE, CAPILLARY - Abnormal; Notable for the following:    Glucose-Capillary  121 (*)    All other components within normal limits  GLUCOSE, CAPILLARY - Abnormal; Notable for the following:    Glucose-Capillary 118 (*)    All other components within normal limits  GLUCOSE, CAPILLARY - Abnormal; Notable for the following:    Glucose-Capillary 121 (*)    All other components within  normal limits  GLUCOSE, CAPILLARY - Abnormal; Notable for the following:    Glucose-Capillary 110 (*)    All other components within normal limits  GLUCOSE, CAPILLARY - Abnormal; Notable for the following:    Glucose-Capillary 100 (*)    All other components within normal limits  GLUCOSE, CAPILLARY - Abnormal; Notable for the following:    Glucose-Capillary 111 (*)    All other components within normal limits  GLUCOSE, CAPILLARY - Abnormal; Notable for the following:    Glucose-Capillary 114 (*)    All other components within normal limits  BASIC METABOLIC PANEL - Abnormal; Notable for the following:    Potassium 3.1 (*)    Glucose, Bld 100 (*)    Calcium 8.0 (*)    All other components within normal limits  GLUCOSE, CAPILLARY - Abnormal; Notable for the following:    Glucose-Capillary 122 (*)    All other components within normal limits  GLUCOSE, CAPILLARY - Abnormal; Notable for the following:    Glucose-Capillary 102 (*)    All other components within normal limits  BASIC METABOLIC PANEL - Abnormal; Notable for the following:    Potassium 3.2 (*)    Chloride 113 (*)    Calcium 8.3 (*)    All other components within normal limits  GLUCOSE, CAPILLARY - Abnormal; Notable for the following:    Glucose-Capillary 102 (*)    All other components within normal limits  GLUCOSE, CAPILLARY - Abnormal; Notable for the following:    Glucose-Capillary 108 (*)    All other components within normal limits  GLUCOSE, CAPILLARY - Abnormal; Notable for the following:    Glucose-Capillary 117 (*)    All other components within normal limits  MRSA PCR SCREENING  CSF CULTURE  CULTURE, BLOOD (SINGLE)  TROPONIN I  HEMOGLOBIN A1C  HERPES SIMPLEX VIRUS(HSV) DNA BY PCR  GLUCOSE, CAPILLARY  GLUCOSE, CAPILLARY  GLUCOSE, CAPILLARY  GLUCOSE, CAPILLARY  GLUCOSE, CAPILLARY  GLUCOSE, CAPILLARY  GLUCOSE, CAPILLARY  GLUCOSE, CAPILLARY  GLUCOSE, CAPILLARY  GLUCOSE, CAPILLARY  GLUCOSE, CAPILLARY   GLUCOSE, CAPILLARY  CBG MONITORING, ED    Imaging Review Dg Abd Portable 1v  04/16/2015  CLINICAL DATA:  Feeding catheter placement EXAM: PORTABLE ABDOMEN - 1 VIEW COMPARISON:  None. FINDINGS: Scattered large and small bowel gas is noted. Feeding catheter is noted coiled within the stomach. Degenerative changes of the lumbar spine are seen. No free air is noted. IMPRESSION: Feeding catheter within the stomach. Electronically Signed   By: Inez Catalina M.D.   On: 04/16/2015 16:56   I have personally reviewed and evaluated these images and lab results as part of my medical decision-making.   EKG Interpretation None      MDM   Final diagnoses:  UTI (lower urinary tract infection)  CAP (community acquired pneumonia)  Toxic encephalopathy  Hypoglycemia    88yM with encephalopathy. Suspect infectious. UTI and possible pneumonia. Abx. Remains poorly responsive despite correct of glucose. Abx. Admit for further management.     Virgel Manifold, MD 04/17/15 1018

## 2015-04-12 NOTE — ED Notes (Signed)
Per EMS, Pt was found in recliner with altered mental status this morning. Pt was last seen normal in recliner last night at 1900. Pt's family states baseline is conversational. When EMS arrived, pt had a CBG of 32 and was unresponsive. Pt did not withdraw from pain. Pt was given 37.5 of Dextrose. When EMS arrived to ED, Pt's CBG 85. Pt opened eyes when patient was moved. Vitals per EMS: 167/65, 68 HR, 14-16 Shallow RR. 98% on 2L. Last CBG 76

## 2015-04-12 NOTE — ED Notes (Signed)
Attempted Report 

## 2015-04-12 NOTE — Progress Notes (Signed)
Dr. Doylene Canard notified of physical assessment findings including:  Lung sounds, pupils and disconjugate alignment on admission, latest CBG, bright red blood smear from rectum, and low platelets.

## 2015-04-12 NOTE — Progress Notes (Signed)
On call cardiologist paged 3 times in observance of order and patient's arrival to floor.

## 2015-04-12 NOTE — H&P (Addendum)
Referring Physician:  [name] is an 82 y.o. male.                       Chief Complaint: Altered Mental Status  HPI: 82 year old male brought in by EMS. He was found by family this morning poorly respsnsive. Last seen sometime around 7 PM yesterday in his usual state of health. On scene his glucose is 32. He was given dextrose without significant improvement of his mental status despite improve of blood sugar. No reported history of trauma or overdose. Review of records, specifically neurology note from this past November, mentions that patient normally independently takes care of his ADLs and prepares his own bills. Currently he is only responsive to noxious stimuli. He is not responding to questioning or following commands. He withdraws all extremities to pain. Chest x-ray and fever is suggestive of pneumonia. CT brain was negative for acute abnormality.  Past Medical History  Diagnosis Date  . Hypertension   . Hyperlipidemia   . Hyperthyroidism   . Anxiety   . Dysrhythmia   . Heart murmur   . Constipation   . GERD (gastroesophageal reflux disease)   . Memory difficulties 10/10/2014  . Type II diabetes mellitus ([place])   . CAD (coronary artery disease)     08/25/03 cath [facility]): NL coronaries, EF 70-75%   . Arthritis     "back, hips" (03/10/2015)  . History of gout   . Prostate CA [facility])     prostate ca dx 20 yrs ago      Past Surgical History  Procedure Laterality Date  . Appendectomy    . Penile prosthesis implant    . Colonoscopy    . Prostatectomy    . Inguinal hernia repair Right 07/14/2014    Procedure: OPEN REPAIR RIGHT INGUINAL HERNIA ;  Surgeon: Fanny Skates, MD;  Location: [place];  Service: General;  Laterality: Right;  . Insertion of mesh Right 07/14/2014    Procedure: INSERTION OF MESH;  Surgeon: Fanny Skates, MD;  Location: [place];  Service: General;  Laterality: Right;  . Laparotomy N/A 08/24/2014    Procedure: EXPLORATORY LAPAROTOMY, RESECTION SIGMOID COLON,  COLOSTOMY;  Surgeon: Jackolyn Confer, MD;  [facility];  Service: General;  Laterality: N/A;  . Colostomy  08/24/2014  . Cardiac catheterization  08/25/2003    [facility]): NL coronaries, EF 70-75%/notes 03/10/2015  . Inguinal hernia repair Left 07/14/2014    with mesh  . Cataract extraction w/ intraocular lens implant Left   . Colon surgery      Family History  Problem Relation Age of Onset  . CVA Mother   . Alcohol abuse Brother   . Dementia Sister    Social History:  reports that he has quit smoking. His smoking use included Cigarettes. He has a 45 pack-year smoking history. He has quit using smokeless tobacco. His smokeless tobacco use included Chew. He reports that he does not drink alcohol or use illicit drugs.  Allergies:  Allergies  Allergen Reactions  . Lisinopril     cough     (Not in a hospital admission)  Results for orders placed or performed during the hospital encounter of 04/12/15 (from the past 48 hour(s))  CBC with Differential     Status: Abnormal   Collection Time: 04/12/15 11:57 AM  Result Value Ref Range   WBC 6.2 4.0 - 10.5 K/uL   RBC 4.48 4.22 - 5.81 MIL/uL   Hemoglobin 13.2 13.0 - 17.0 g/dL  HCT 39.3 39.0 - 52.0 %   MCV 87.7 78.0 - 100.0 fL   MCH 29.5 26.0 - 34.0 pg   MCHC 33.6 30.0 - 36.0 g/dL   RDW 14.8 11.5 - 15.5 %   Platelets 78 (L) 150 - 400 K/uL    Comment: SPECIMEN CHECKED FOR CLOTS REPEATED TO VERIFY PLATELET COUNT CONFIRMED BY SMEAR    Neutrophils Relative % 86 %   Neutro Abs 5.3 1.7 - 7.7 K/uL   Lymphocytes Relative 7 %   Lymphs Abs 0.4 (L) 0.7 - 4.0 K/uL   Monocytes Relative 7 %   Monocytes Absolute 0.4 0.1 - 1.0 K/uL   Eosinophils Relative 0 %   Eosinophils Absolute 0.0 0.0 - 0.7 K/uL   Basophils Relative 0 %   Basophils Absolute 0.0 0.0 - 0.1 K/uL  Basic metabolic panel     Status: Abnormal   Collection Time: 04/12/15 11:57 AM  Result Value Ref Range   Sodium 139 135 - 145 mmol/L   Potassium 3.4 (L) 3.5 - 5.1 mmol/L    Chloride 104 101 - 111 mmol/L   CO2 25 22 - 32 mmol/L   Glucose, Bld 73 65 - 99 mg/dL   BUN 21 (H) 6 - 20 mg/dL   Creatinine, Ser 1.36 (H) 0.61 - 1.24 mg/dL   Calcium 8.8 (L) 8.9 - 10.3 mg/dL   GFR calc non Af Amer 45 (L) >60 mL/min   GFR calc Af Amer 53 (L) >60 mL/min    Comment: (NOTE) The eGFR has been calculated using the CKD EPI equation. This calculation has not been validated in all clinical situations. eGFR's persistently <60 mL/min signify possible Chronic Kidney Disease.    Anion gap 10 5 - 15  Lactic acid, plasma     Status: Abnormal   Collection Time: 04/12/15 11:57 AM  Result Value Ref Range   Lactic Acid, Venous 3.0 (HH) 0.5 - 2.0 mmol/L    Comment: CRITICAL RESULT CALLED TO, READ BACK BY AND VERIFIED WITH: REAGOLD,S RN 04/12/15 1254 WOOTEN,K   Troponin I     Status: None   Collection Time: 04/12/15 11:57 AM  Result Value Ref Range   Troponin I <0.03 <0.031 ng/mL    Comment:        NO INDICATION OF MYOCARDIAL INJURY.   Brain natriuretic peptide     Status: Abnormal   Collection Time: 04/12/15 11:57 AM  Result Value Ref Range   B Natriuretic Peptide 292.1 (H) 0.0 - 100.0 pg/mL  Urinalysis, Routine w reflex microscopic (not at [facility])     Status: Abnormal   Collection Time: 04/12/15 12:05 PM  Result Value Ref Range   Color, Urine YELLOW YELLOW   APPearance CLOUDY (A) CLEAR   Specific Gravity, Urine 1.013 1.005 - 1.030   pH 6.0 5.0 - 8.0   Glucose, UA NEGATIVE NEGATIVE mg/dL   Hgb urine dipstick MODERATE (A) NEGATIVE   Bilirubin Urine NEGATIVE NEGATIVE   Ketones, ur NEGATIVE NEGATIVE mg/dL   Protein, ur NEGATIVE NEGATIVE mg/dL   Nitrite POSITIVE (A) NEGATIVE   Leukocytes, UA LARGE (A) NEGATIVE  Urine microscopic-add on     Status: Abnormal   Collection Time: 04/12/15 12:05 PM  Result Value Ref Range   Squamous Epithelial / LPF NONE SEEN NONE SEEN   WBC, UA TOO NUMEROUS TO COUNT 0 - 5 WBC/hpf   RBC / HPF 6-30 0 - 5 RBC/hpf   Bacteria, UA MANY (A) NONE SEEN   POC CBG, ED  Status: None   Collection Time: 04/12/15 12:41 PM  Result Value Ref Range   Glucose-Capillary 68 65 - 99 mg/dL   Ct Head Wo Contrast  04/12/2015  CLINICAL DATA:  Found unresponsive. Last seen normal at 7 p.m. yesterday. EXAM: CT HEAD WITHOUT CONTRAST TECHNIQUE: Contiguous axial images were obtained from the base of the skull through the vertex without intravenous contrast. COMPARISON:  11/03/2014 FINDINGS: Sinuses/Soft tissues: No significant soft tissue swelling. Cerumen in the left external ear canal. Clear paranasal sinuses and mastoid air cells. Intracranial: Cerebral atrophy is age appropriate. Somewhat more focal atrophy adjacent the frontal lobes, with resultant prominence of the extra-axial spaces. Example image 16/series 2. This is similar. Mild low density in the periventricular white matter likely related to small vessel disease. No mass lesion, hemorrhage, hydrocephalus, acute infarct, intra-axial, or extra-axial fluid collection. IMPRESSION: 1. No acute finding or significant change since 11/03/2014. 2. Cerebral atrophy, most focal in the frontal lobes with resultant prominence of the extra-axial spaces. 3. Relatively mild for age small vessel ischemic change. Electronically Signed   By: Abigail Miyamoto M.D.   On: 04/12/2015 11:38   Dg Chest Portable 1 View  04/12/2015  CLINICAL DATA:  Unresponsive. Hypertension. Coronary artery disease. EXAM: PORTABLE CHEST 1 VIEW COMPARISON:  03/11/2015 FINDINGS: Hyperinflation. Midline trachea. Mild cardiomegaly. Mild right hemidiaphragm elevation. No pleural effusion or pneumothorax. Left greater than right basilar airspace disease. IMPRESSION: Left base airspace disease, suspicious for infection or aspiration. Right base airspace disease is favored to represent atelectasis. Electronically Signed   By: Abigail Miyamoto M.D.   On: 04/12/2015 11:30    Review Of Systems General Present- Night Sweats. Not Present- Appetite Loss, Chills, Fatigue,  Fever, Weight Gain and Weight Loss. Skin Not Present- Change in Wart/Mole, Dryness, Hives, Jaundice, New Lesions, Non-Healing Wounds, Rash and Ulcer. HEENT Not Present- Earache, Hearing Loss, Hoarseness, Nose Bleed, Oral Ulcers, Ringing in the Ears, Seasonal Allergies, Sinus Pain, Sore Throat, Visual Disturbances, Wears glasses/contact lenses and Yellow Eyes. Respiratory Not Present- Bloody sputum, Chronic Cough, Difficulty Breathing, Snoring and Wheezing. Breast Not Present- Breast Mass, Breast Pain, Nipple Discharge and Skin Changes. Cardiovascular Present- Leg Cramps. Not Present- Chest Pain, Difficulty Breathing Lying Down, Palpitations, Rapid Heart Rate, Shortness of Breath and Swelling of Extremities. Gastrointestinal Present- Constipation. Not Present- Abdominal Pain, Bloating, Bloody Stool, Change in Bowel Habits, Chronic diarrhea, Difficulty Swallowing, Excessive gas, Gets full quickly at meals, Hemorrhoids, Indigestion, Nausea, Rectal Pain and Vomiting. Musculoskeletal Not Present- Back Pain, Joint Pain, Joint Stiffness, Muscle Pain, Muscle Weakness and Swelling of Extremities. Neurological Present- Decreased Memory. Not Present- Fainting, Headaches, Numbness, Seizures, Tingling, Tremor, Trouble walking and Weakness. Psychiatric Not Present- Anxiety, Bipolar, Change in Sleep Pattern, Depression, Fearful and Frequent crying. Endocrine Present- New Diabetes. Not Present- Cold Intolerance, Excessive Hunger, Hair Changes, Heat Intolerance and Hot flashes. Hematology Not Present- Easy Bruising, Excessive bleeding, Gland problems, HIV and Persistent Infections.  Blood pressure 141/70, pulse 64, temperature 101.5 F (38.6 C), temperature source Rectal, resp. rate 17, SpO2 100 %.  Physical Exam  Constitutional: Elderly male who appears mildly dyspneic.  HENT: Normocephalic and atraumatic, brown eyes, conj-pink, Sclera-white, wears glasses.  Neck: No JVD, supple Cardiovascular: Normal rate,  Intermittent irregular rhythm. Gr III/VI systolic murmur.  Respiratory: Bibasilar crackles. Marland Kitchen  GI: Soft. Non-distended and non-tender.Lower midline scar. Indented right lower quadrant scar.  Musculoskeletal: No edema, cyanosis or clubbing.  Neurological: He is not arousable.  Skin: Skin is warm and dry.   Assessment/Plan Acute pneumonia  Altered Mental status Hypoglycemic attack Hypertension DM, II CAD  Admit/IV antibiotic/Hold PO medications  Eashan Schipani S, MD  04/12/2015, 1:48 PM

## 2015-04-12 NOTE — Consult Note (Signed)
Neurology Consultation Reason for Consult: Altered Mental Status Referring Physician: Doylene Canard, A  CC: Altered Mental status  History is obtained from: Chart  HPI: Shane [name] is a 82 y.o. male with a history of hypertension, hyperlipidemia who presents with altered mental status. He was last seen well  last night, and was found in his recliner this morning unresponsive. Blood glucose at the time was 36. His blood glucose was treated, but he continued to be unresponsive and was found to have pneumonia as well as urinary tract infection and was treated with ceftriaxone.   With continued unresponsiveness, Neurology has been consulted.    LKW: 12/31 tpa given?: No, delay in arrival    ROS:  Unable to obtain due to altered mental status.   Past Medical History  Diagnosis Date  . Hypertension   . Hyperlipidemia   . Hyperthyroidism   . Anxiety   . Dysrhythmia   . Heart murmur   . Constipation   . GERD (gastroesophageal reflux disease)   . Memory difficulties 10/10/2014  . Type II diabetes mellitus ([place])   . CAD (coronary artery disease)     08/25/03 cath [facility]): NL coronaries, EF 70-75%   . Arthritis     "back, hips" (03/10/2015)  . History of gout   . Prostate CA [facility])     prostate ca dx 20 yrs ago     Family History  Problem Relation Age of Onset  . CVA Mother   . Alcohol abuse Brother   . Dementia Sister      Social History:  reports that he has quit smoking. His smoking use included Cigarettes. He has a 45 pack-year smoking history. He has quit using smokeless tobacco. His smokeless tobacco use included Chew. He reports that he does not drink alcohol or use illicit drugs.   Exam: Current vital signs: BP 132/60 mmHg  Pulse 83  Temp(Src) 98.5 F (36.9 C) (Axillary)  Resp 19  Ht 6' (1.829 m)  Wt 61.9 kg (136 lb 7.4 oz)  BMI 18.50 kg/m2  SpO2 99% Vital signs in last 24 hours: Temp:  [98.5 F (36.9 C)-101.5 F (38.6 C)] 98.5 F (36.9 C) (01/01 1600) Pulse  Rate:  [64-84] 83 (01/01 1600) Resp:  [17-21] 19 (01/01 1600) BP: (125-153)/(55-70) 132/60 mmHg (01/01 1600) SpO2:  [96 %-100 %] 99 % (01/01 1600) FiO2 (%):  [28 %] 28 % (01/01 1345) Weight:  [61.9 kg (136 lb 7.4 oz)] 61.9 kg (136 lb 7.4 oz) (01/01 1600)   Physical Exam  Constitutional: Appears elderly Psych: unresponsive Eyes: No scleral injection HENT: No OP obstruction Head: Normocephalic.  Cardiovascular: Normal rate and regular rhythm.  Respiratory: Effort normal, some upper airway sounds GI: Soft.  No distension. There is no tenderness.  Skin: WDI  Neuro: Mental Status: Patient is obtunded, opens eyes to deep noxious stimulation. Does not follow commands Cranial Nerves: II: Does not blink to threat. Pupils are equal, round, and reactive to light.   III,IV, VI: roving eye movements, crossing midline in both directions V: VII: corneal intact on left, weak on right VIII, X, XI, XII: Unable to assess secondary to patient's altered mental status.  Motor: Tone is increased diffusely. He has flexion to noxious stimuli bilaterally, though does appear to withdraw in the lowers.  Sensory: He responds to nox stim x4 Plantars: Toes are upgoing bilaterally Cerebellar: Unable to assess secondary to patient's altered mental status.     I have reviewed labs in epic and the results  pertinent to this consultation are: +UTI Elevated lactate   I have reviewed the images obtained:CT head - ? Hypodensity in the left pons and cerebellum though likely streak artifact  Impression: 82 yo M with severe encephalopathy and abnormal reflexes(corneal and plantars). I am concerned that he may have had a devastating cerebral injury such as basilar thrombosis. Also possible would be a severe hypoglycemic injury if his BG was lower for a long period of time prior to him being discovered. Severe septic encephalopathy may also be possible. He has already received ceftriaxone. At this time, his  prognosis is unclear.   Recommendations: 1) STAT MRI brain 2) Further recommendations following MRI brain.    Roland Rack, MD [facility] [phone]  If 7pm- 7am, please page neurology on call as listed in [place].

## 2015-04-13 ENCOUNTER — Inpatient Hospital Stay (HOSPITAL_COMMUNITY): Payer: Medicare Other

## 2015-04-13 DIAGNOSIS — R400 Somnolence: Secondary | ICD-10-CM

## 2015-04-13 LAB — CBC
HEMATOCRIT: 35.6 % — AB (ref 39.0–52.0)
Hemoglobin: 11.7 g/dL — ABNORMAL LOW (ref 13.0–17.0)
MCH: 28.3 pg (ref 26.0–34.0)
MCHC: 32.9 g/dL (ref 30.0–36.0)
MCV: 86.2 fL (ref 78.0–100.0)
Platelets: 75 10*3/uL — ABNORMAL LOW (ref 150–400)
RBC: 4.13 MIL/uL — ABNORMAL LOW (ref 4.22–5.81)
RDW: 14.8 % (ref 11.5–15.5)
WBC: 10.8 10*3/uL — ABNORMAL HIGH (ref 4.0–10.5)

## 2015-04-13 LAB — CSF CELL COUNT WITH DIFFERENTIAL
RBC Count, CSF: 52 /mm3 — ABNORMAL HIGH
Tube #: 1
WBC, CSF: 2 /mm3 (ref 0–5)

## 2015-04-13 LAB — PROTEIN AND GLUCOSE, CSF
Glucose, CSF: 78 mg/dL — ABNORMAL HIGH (ref 40–70)
TOTAL PROTEIN, CSF: 35 mg/dL (ref 15–45)

## 2015-04-13 LAB — BASIC METABOLIC PANEL
ANION GAP: 10 (ref 5–15)
BUN: 16 mg/dL (ref 6–20)
CALCIUM: 7.9 mg/dL — AB (ref 8.9–10.3)
CO2: 24 mmol/L (ref 22–32)
CREATININE: 1.24 mg/dL (ref 0.61–1.24)
Chloride: 107 mmol/L (ref 101–111)
GFR, EST AFRICAN AMERICAN: 59 mL/min — AB (ref >60)
GFR, EST NON AFRICAN AMERICAN: 51 mL/min — AB (ref >60)
GLUCOSE: 145 mg/dL — AB (ref 65–99)
Potassium: 3.1 mmol/L — ABNORMAL LOW (ref 3.5–5.1)
Sodium: 141 mmol/L (ref 135–145)

## 2015-04-13 LAB — GLUCOSE, CAPILLARY
GLUCOSE-CAPILLARY: 121 mg/dL — AB (ref 65–99)
GLUCOSE-CAPILLARY: 118 mg/dL — AB (ref 65–99)
GLUCOSE-CAPILLARY: 121 mg/dL — AB (ref 65–99)
GLUCOSE-CAPILLARY: 110 mg/dL — AB (ref 65–99)
Glucose-Capillary: 133 mg/dL — ABNORMAL HIGH (ref 65–99)

## 2015-04-13 MED ORDER — DEXTROSE 5 % IV SOLN
1.0000 g | Freq: Once | INTRAVENOUS | Status: DC
  Filled 2015-04-13: qty 10

## 2015-04-13 MED ORDER — AMPICILLIN SODIUM 2 G IJ SOLR
2.0000 g | Freq: Four times a day (QID) | INTRAMUSCULAR | Status: DC
  Administered 2015-04-13: 2 g via INTRAVENOUS
  Filled 2015-04-13 (×3): qty 2000

## 2015-04-13 MED ORDER — DEXTROSE 5 % IV SOLN
2.0000 g | Freq: Two times a day (BID) | INTRAVENOUS | Status: DC
  Filled 2015-04-13: qty 2

## 2015-04-13 MED ORDER — DEXTROSE 5 % IV SOLN
2.0000 g | INTRAVENOUS | Status: DC
  Administered 2015-04-14 – 2015-04-17 (×4): 2 g via INTRAVENOUS
  Filled 2015-04-13 (×4): qty 2

## 2015-04-13 MED ORDER — VANCOMYCIN HCL IN DEXTROSE 1-5 GM/200ML-% IV SOLN: 1000.0000 mg | INTRAVENOUS | Status: DC

## 2015-04-13 MED ORDER — VANCOMYCIN HCL 10 G IV SOLR
1500.0000 mg | Freq: Once | INTRAVENOUS | Status: AC
  Administered 2015-04-13: 1500 mg via INTRAVENOUS
  Filled 2015-04-13: qty 1500

## 2015-04-13 MED ORDER — DEXTROSE 5 % IV SOLN
2.0000 g | Freq: Two times a day (BID) | INTRAVENOUS | Status: DC
  Administered 2015-04-13 (×2): 2 g via INTRAVENOUS
  Filled 2015-04-13 (×2): qty 2

## 2015-04-13 NOTE — Progress Notes (Signed)
ANTIBIOTIC CONSULT NOTE - INITIAL  Pharmacy Consult for Ceftriaxone, ampicillin, Vancomycin Indication: Meningitis   Allergies  Allergen Reactions  . Lisinopril     cough    Patient Measurements: Height: 6' (182.9 cm) Weight: 136 lb 7.4 oz (61.9 kg) IBW/kg (Calculated) : 77.6  Vital Signs: Temp: 99.1 F (37.3 C) (01/01 2238) Temp Source: Axillary (01/01 2238) BP: 126/61 mmHg (01/01 2240) Pulse Rate: 71 (01/01 2240) Intake/Output from previous day: 01/01 0701 - 01/02 0700 In: 346.7 [I.V.:346.7] Out: 188 [Urine:188] Intake/Output from this shift: Total I/O In: 346.7 [I.V.:346.7] Out: 188 [Urine:188]  Labs:  Recent Labs  04/12/15 1157  WBC 6.2  HGB 13.2  PLT 78*  CREATININE 1.36*   Estimated Creatinine Clearance: 34.1 mL/min (by C-G formula based on Cr of 1.36). No results for input(s): VANCOTROUGH, VANCOPEAK, VANCORANDOM, GENTTROUGH, GENTPEAK, GENTRANDOM, TOBRATROUGH, TOBRAPEAK, TOBRARND, AMIKACINPEAK, AMIKACINTROU, AMIKACIN in the last 72 hours.   Microbiology: Recent Results (from the past 720 hour(s))  MRSA PCR Screening     Status: None   Collection Time: 04/12/15  4:00 PM  Result Value Ref Range Status   MRSA by PCR NEGATIVE NEGATIVE Final    Comment:        The GeneXpert MRSA Assay (FDA approved for NASAL specimens only), is one component of a comprehensive MRSA colonization surveillance program. It is not intended to diagnose MRSA infection nor to guide or monitor treatment for MRSA infections.     Medical History: Past Medical History  Diagnosis Date  . Hypertension   . Hyperlipidemia   . Hyperthyroidism   . Anxiety   . Dysrhythmia   . Heart murmur   . Constipation   . GERD (gastroesophageal reflux disease)   . Memory difficulties 10/10/2014  . Type II diabetes mellitus ([place])   . CAD (coronary artery disease)     08/25/03 cath [facility]): NL coronaries, EF 70-75%   . Arthritis     "back, hips" (03/10/2015)  . History of gout   . Prostate  CA [facility])     prostate ca dx 20 yrs ago    Medications:  Prescriptions prior to admission  Medication Sig Dispense Refill Last Dose  . aspirin EC 81 MG tablet Take 81 mg by mouth at bedtime.   04/11/2015 at Unknown time  . glipiZIDE (GLUCOTROL XL) 2.5 MG 24 hr tablet Take 2.5 mg by mouth daily.  6 04/11/2015 at Unknown time  . alendronate (FOSAMAX) 70 MG tablet Take 1 tablet by mouth daily.  5 Taking  . atorvastatin (LIPITOR) 20 MG tablet Take 20 mg by mouth at bedtime.    Taking  . calcium carbonate (TUMS EX) 750 MG chewable tablet Chew 1 tablet by mouth daily as needed for heartburn.    Taking  . Cholecalciferol (VITAMIN D PO) Take 1 tablet by mouth daily.   Taking  . [employer] SENNA PLUS 8.6-50 MG per tablet TAKE 2 TABLETS BY MOUTH 2 (TWO) TIMES DAILY. 180 tablet 2 Taking  . erythromycin ophthalmic ointment Place into the left eye 4 (four) times daily. Apply 1/2 inch to left eye 4 times a day for 5 days. (Patient taking differently: Place 1 application into the right eye daily as needed (irritation). ) 3.5 g 0 Taking  . furosemide (LASIX) 40 MG tablet Take 40 mg by mouth daily.   Taking  . gemfibrozil (LOPID) 600 MG tablet Take 600 mg by mouth 2 (two) times daily before a meal.   Taking  . HYDROcodone-acetaminophen (NORCO) 5-325 MG  per tablet Take 1-2 tablets by mouth every 6 (six) hours as needed for moderate pain or severe pain. 30 tablet 0 Taking  . ketorolac (ACULAR) 0.4 % SOLN Place 1 drop into the right eye 4 (four) times daily.   Taking  . Lactulose 20 GM/30ML SOLN Take 30 mLs (20 g total) by mouth 2 (two) times daily as needed (For constipation). 500 mL 2 Taking  . losartan-hydrochlorothiazide (HYZAAR) 100-25 MG per tablet Take 0.5 tablets by mouth daily.    Taking  . metoCLOPramide (REGLAN) 5 MG tablet Take 5 mg by mouth 4 (four) times daily -  before meals and at bedtime.   3 Taking  . ofloxacin (OCUFLOX) 0.3 % ophthalmic solution Place 1 drop into the right eye 4 (four) times daily.    Taking  . omeprazole (PRILOSEC) 20 MG capsule Take 20 mg by mouth daily before breakfast.    Taking  . OVER THE COUNTER MEDICATION Place 1 drop into the left eye at bedtime as needed (irritation). Over the counter eye drops   Taking  . potassium chloride (K-DUR) 10 MEQ tablet Take 10 mEq by mouth daily.   Taking  . prednisoLONE acetate (PRED FORTE) 1 % ophthalmic suspension Place 1 drop into the right eye 4 (four) times daily.   Taking  . predniSONE (DELTASONE) 5 MG tablet TAKE 1 TABLET BY MOUTH DAILY 90 tablet 0 Taking  . sulindac (CLINORIL) 200 MG tablet Take 200 mg by mouth daily.    Taking  . vitamin C (ASCORBIC ACID) 500 MG tablet Take 500 mg by mouth daily.   Taking  . ZYTIGA 250 MG tablet TAKE 4 TABLETS BY MOUTH DAILY ON AN EMPTY STOMACH 1 HOUR BEFORE OR 2 HOURS AFTER A MEAL 120 tablet 5 Taking   Assessment: 41 YOM who presented with altered mental status. He was treated for pneumonia and UTI but continued to be unresponsive. Pharmacy consulted to start empiric IV antibiotics for r/o meningitis. MRI results pending. WBC wnl, Tm 101.5. LA 3.9. SCr elevated acutely at 1.36 (BL ~ 1). CrCl ~ 35 mL/min   Goal of Therapy:  Vancomycin trough level 15-20 mcg/ml  Plan:  -Vancomycin 1500 mg IV once followed by 1 gm IV Q 24 hours -Ampicillin 2 gm IV Q 6 hours -Ceftriaxone 2 gm IV Q 12 hours -Monitor CBC, renal fx and cultures -Vanc trough at [place], PharmD., BCPS Clinical Pharmacist Pager [phone]

## 2015-04-13 NOTE — Procedures (Signed)
ELECTROENCEPHALOGRAM REPORT   Patient: Shane [name]        Age: 82 y.o.        Sex: male Referring Physician: Dr Leonel Ramsay Report Date:  04/13/2015        Interpreting Physician: Hulen Luster  History: York Harcum is an 82 y.o. male admitted with AMS  Medications:  I have reviewed the patient's current medications.  Conditions of Recording:  This is a 16 channel EEG carried out with the patient in the lethargic state.   Description:  The waking background activity consists of a low voltage, symmetrical,  Poorly organized mix of theta activity with occasional delta activity, seen from the parieto-occipital and posterior temporal regions. No focal slowing or epileptiform activity noted.   Hyperventilation was not performed. Intermittent photic stimulation was not performed.   IMPRESSION: Abnormal EEG due to the presence of generalized slowing indicating a mild to moderate cerebral disturbance (encephalopathy). No epileptiform activity noted.    Jim Like, DO [facility] [phone]  If 7pm- 7am, please page neurology on call as listed in [place]. 04/13/2015, 11:07 AM

## 2015-04-13 NOTE — Progress Notes (Signed)
Ref: Birdie Riddle, MD   Subjective:  Unresponsive. Stable vital signs.   Objective:  Vital Signs in the last 24 hours: Temp:  [98.5 F (36.9 C)-99.4 F (37.4 C)] 99.4 F (37.4 C) (01/02 1153) Pulse Rate:  [64-83] 82 (01/02 0806) Cardiac Rhythm:  [-] Normal sinus rhythm (01/02 0745) Resp:  [17-30] 21 (01/02 0806) BP: (126-142)/(55-75) 139/74 mmHg (01/02 0806) SpO2:  [96 %-100 %] 100 % (01/02 1414) Weight:  [61.9 kg (136 lb 7.4 oz)-62.2 kg (137 lb 2 oz)] 62.2 kg (137 lb 2 oz) (01/02 0500)  Physical Exam: BP Readings from Last 1 Encounters:  04/13/15 139/74    Wt Readings from Last 1 Encounters:  04/13/15 62.2 kg (137 lb 2 oz)    Weight change:   HEENT: [place]/AT, Eyes-Brown, PERL, EOMI, Conjunctiva-Pink, Sclera-Non-icteric Neck: No JVD, No bruit, Trachea midline. Lungs:  Coarse crackles, Bilateral. Cardiac:  Regular rhythm, normal S1 and S2, no S3. III/VI systolic murmur. Abdomen:  Soft. Extremities:  No edema present. No cyanosis. No clubbing. CNS: AxOx0, not responding to calling name or tapping on shoulder. Skin: Warm and dry.   Intake/Output from previous day: 01/01 0701 - 01/02 0700 In: 1396.7 [I.V.:796.7; IV Piggyback:600] Out: 503 [Urine:503]    Lab Results: BMET    Component Value Date/Time   NA 141 04/13/2015 0539   NA 139 04/12/2015 1157   NA 143 04/02/2015 1310   NA 141 03/12/2015 0348   NA 143 02/04/2015 0810   NA 144 06/24/2014 1220   NA 141 07/06/2011 0855   K 3.1* 04/13/2015 0539   K 3.4* 04/12/2015 1157   K 2.7* 04/02/2015 1310   K 3.4* 03/12/2015 0348   K 3.3* 02/04/2015 0810   K 3.9 06/24/2014 1220   K 3.6 07/06/2011 0855   CL 107 04/13/2015 0539   CL 104 04/12/2015 1157   CL 104 03/12/2015 0348   CL 105 09/17/2012 0801   CL 107 08/08/2012 0755   CL 105 06/27/2012 1121   CL 104 07/06/2011 0855   CO2 24 04/13/2015 0539   CO2 25 04/12/2015 1157   CO2 33* 04/02/2015 1310   CO2 29 03/12/2015 0348   CO2 29 02/04/2015 0810   CO2 26  06/24/2014 1220   CO2 28 07/06/2011 0855   GLUCOSE 145* 04/13/2015 0539   GLUCOSE 73 04/12/2015 1157   GLUCOSE 101 04/02/2015 1310   GLUCOSE 83 03/12/2015 0348   GLUCOSE 130 02/04/2015 0810   GLUCOSE 138 06/24/2014 1220   GLUCOSE 207* 09/17/2012 0801   GLUCOSE 202* 08/08/2012 0755   GLUCOSE 127* 06/27/2012 1121   GLUCOSE 90 07/06/2011 0855   BUN 16 04/13/2015 0539   BUN 21* 04/12/2015 1157   BUN 36.9* 04/02/2015 1310   BUN 14 03/12/2015 0348   BUN 22.3 02/04/2015 0810   BUN 17.4 06/24/2014 1220   BUN 18 07/06/2011 0855   CREATININE 1.24 04/13/2015 0539   CREATININE 1.36* 04/12/2015 1157   CREATININE 1.5* 04/02/2015 1310   CREATININE 1.11 03/12/2015 0348   CREATININE 1.5* 02/04/2015 0810   CREATININE 1.3 06/24/2014 1220   CREATININE 1.6* 07/06/2011 0855   CALCIUM 7.9* 04/13/2015 0539   CALCIUM 8.8* 04/12/2015 1157   CALCIUM 9.0 04/02/2015 1310   CALCIUM 8.5* 03/12/2015 0348   CALCIUM 9.1 02/04/2015 0810   CALCIUM 9.0 06/24/2014 1220   CALCIUM 8.8 07/06/2011 0855   GFRNONAA 51* 04/13/2015 0539   GFRNONAA 45* 04/12/2015 1157   GFRNONAA 58* 03/12/2015 0348   GFRAA 59* 04/13/2015  0539   GFRAA 53* 04/12/2015 1157   GFRAA >60 03/12/2015 0348   CBC    Component Value Date/Time   WBC 10.8* 04/13/2015 0539   WBC 3.8* 04/02/2015 1310   RBC 4.13* 04/13/2015 0539   RBC 3.84* 04/02/2015 1310   HGB 11.7* 04/13/2015 0539   HGB 11.1* 04/02/2015 1310   HCT 35.6* 04/13/2015 0539   HCT 33.9* 04/02/2015 1310   PLT 75* 04/13/2015 0539   PLT 84* 04/02/2015 1310   MCV 86.2 04/13/2015 0539   MCV 88.3 04/02/2015 1310   MCH 28.3 04/13/2015 0539   MCH 28.9 04/02/2015 1310   MCHC 32.9 04/13/2015 0539   MCHC 32.7 04/02/2015 1310   RDW 14.8 04/13/2015 0539   RDW 14.5 04/02/2015 1310   LYMPHSABS 0.4* 04/12/2015 1157   LYMPHSABS 1.3 04/02/2015 1310   MONOABS 0.4 04/12/2015 1157   MONOABS 0.5 04/02/2015 1310   EOSABS 0.0 04/12/2015 1157   EOSABS 0.1 04/02/2015 1310   BASOSABS 0.0  04/12/2015 1157   BASOSABS 0.0 04/02/2015 1310   HEPATIC Function Panel  Recent Labs  02/04/15 0810 03/10/15 1818 04/02/15 1310  PROT 7.0 6.3* 7.2   HEMOGLOBIN A1C No components found for: HGA1C,  MPG CARDIAC ENZYMES Lab Results  Component Value Date   TROPONINI <0.03 04/12/2015   TROPONINI 0.03 03/11/2015   TROPONINI 0.03 03/10/2015   BNP No results for input(s): PROBNP in the last 8760 hours. TSH  Recent Labs  01/16/15 1611  TSH 1.415   CHOLESTEROL No results for input(s): CHOL in the last 8760 hours.  Scheduled Meds: . albuterol  2.5 mg Nebulization TID  . [START ON 04/14/2015] cefTRIAXone (ROCEPHIN)  IV  2 g Intravenous Q24H  . enoxaparin (LOVENOX) injection  40 mg Subcutaneous Q24H  . insulin aspart  0-9 Units Subcutaneous TID WC   Continuous Infusions: . sodium chloride    . dextrose 5 % and 0.45% NaCl 50 mL/hr (04/13/15 1222)   PRN Meds:.  Assessment/Plan: Acute pneumonia Altered Mental status Hypoglycemic attack Hypertension DM, II CAD  Appreciate neurology consult. Prognosis remains guarded   LOS: 1 day    [name]  MD  04/13/2015, 2:27 PM

## 2015-04-13 NOTE — Procedures (Signed)
Indication: Febrile altered mental status  Risks of the procedure were dicussed with the patient including post-LP headache, bleeding, infection, weakness/numbness of legs(radiculopathy), death.  The patient/patient's proxy agreed and written consent was obtained.   The patient was prepped and draped, and using sterile technique a 20 gauge quinke spinal needle was inserted in the L4-5 space. The opening pressure was 9 cm H2O. Approximately 5 cc of CSF were obtained and sent for analysis.   I have ordered for empiric antibiotic coverage pending CSF results, if no pleocytosis, then can scale back abx.   Roland Rack, MD [facility] [phone]  If 7pm- 7am, please page neurology on call as listed in [place].

## 2015-04-13 NOTE — Progress Notes (Signed)
EEG Completed; Results Pending  

## 2015-04-13 NOTE — Progress Notes (Signed)
Subjective: The patient continues to be encephalopathic, somnolent and not responsive. Vital signs stable. His neurological workup so far has been negative with no acute pathology identified. MRI brain did not show evidence of any acute stroke. EEG suggestive of encephalopathy. CSF results show normal protein, WBC of 2 and this is not indicative of a CNS infection. No family at bedside  Exam: Filed Vitals:   04/13/15 2034 04/13/15 2110  BP: 152/76   Pulse: 84 78  Temp:    Resp: 20 16   Patient is very somnolent, difficult to arouse with stimulation midline gaze, no specific gaze deviation or nystagmus noted pupils equal and reactive. Facial creases appear symmetric Increased tone with rigidity noted in bilateral upper extremities. Minimal facial grimace and bilateral upper extremities withdrawal to stimulation noted.   Pertinent Labs: Brain MRI, CSF studies are negative for any acute pathology. EEG suggestive of encephalopathy, no seizures  Impression:  82 year old male patient with acute encephalopathy, no clear etiology known, except for that he had severe hypoglycemia at presentation. Possible severe hypoglycemia related to metabolic encephalopathy.  Will order blood culture to rule out bacteremia.  No further neurodiagnostic studies recommended at this point. We'll continue to follow-up clinically and will plan to repeat EEG based on clinical progress.

## 2015-04-14 ENCOUNTER — Inpatient Hospital Stay (HOSPITAL_COMMUNITY): Payer: Medicare Other

## 2015-04-14 LAB — GLUCOSE, CAPILLARY
GLUCOSE-CAPILLARY: 85 mg/dL (ref 65–99)
GLUCOSE-CAPILLARY: 100 mg/dL — AB (ref 65–99)
Glucose-Capillary: 111 mg/dL — ABNORMAL HIGH (ref 65–99)
Glucose-Capillary: 114 mg/dL — ABNORMAL HIGH (ref 65–99)
Glucose-Capillary: 122 mg/dL — ABNORMAL HIGH (ref 65–99)

## 2015-04-14 LAB — HERPES SIMPLEX VIRUS(HSV) DNA BY PCR
HSV 1 DNA: NEGATIVE
HSV 2 DNA: NEGATIVE

## 2015-04-14 LAB — HEMOGLOBIN A1C
Hgb A1c MFr Bld: 5.1 % (ref 4.8–5.6)
Mean Plasma Glucose: 100 mg/dL

## 2015-04-14 MED ORDER — CHLORHEXIDINE GLUCONATE 0.12 % MT SOLN
15.0000 mL | Freq: Two times a day (BID) | OROMUCOSAL | Status: DC
  Administered 2015-04-14 – 2015-04-26 (×25): 15 mL via OROMUCOSAL
  Filled 2015-04-14 (×24): qty 15

## 2015-04-14 MED ORDER — KCL IN DEXTROSE-NACL 40-5-0.45 MEQ/L-%-% IV SOLN
INTRAVENOUS | Status: AC
  Administered 2015-04-14 – 2015-04-16 (×3): via INTRAVENOUS
  Administered 2015-04-17: 30 mL/h via INTRAVENOUS
  Administered 2015-04-20: 11:00:00 via INTRAVENOUS
  Filled 2015-04-14 (×9): qty 1000

## 2015-04-14 MED ORDER — INSULIN ASPART 100 UNIT/ML ~~LOC~~ SOLN
0.0000 [IU] | SUBCUTANEOUS | Status: DC
  Administered 2015-04-14 – 2015-04-25 (×6): 1 [IU] via SUBCUTANEOUS

## 2015-04-14 MED ORDER — CETYLPYRIDINIUM CHLORIDE 0.05 % MT LIQD
7.0000 mL | Freq: Two times a day (BID) | OROMUCOSAL | Status: DC
  Administered 2015-04-14 – 2015-04-26 (×24): 7 mL via OROMUCOSAL

## 2015-04-14 MED ORDER — ACETAMINOPHEN 650 MG RE SUPP
650.0000 mg | Freq: Four times a day (QID) | RECTAL | Status: DC | PRN
  Administered 2015-04-14 – 2015-04-18 (×3): 650 mg via RECTAL
  Filled 2015-04-14 (×4): qty 1

## 2015-04-14 MED ORDER — POTASSIUM CHLORIDE IN NACL 20-0.9 MEQ/L-% IV SOLN
INTRAVENOUS | Status: DC
  Administered 2015-04-14: 11:00:00 via INTRAVENOUS
  Filled 2015-04-14 (×2): qty 1000

## 2015-04-14 MED ORDER — FUROSEMIDE 10 MG/ML IJ SOLN
40.0000 mg | Freq: Once | INTRAMUSCULAR | Status: AC
  Administered 2015-04-14: 40 mg via INTRAVENOUS
  Filled 2015-04-14: qty 4

## 2015-04-14 NOTE — Progress Notes (Signed)
Pt orally sx returning moderate amount of thick, yellow/tan secretions. Pt tolerated well.

## 2015-04-14 NOTE — Progress Notes (Signed)
RT note-Orally suctioned for moderate amount secretions.

## 2015-04-14 NOTE — Progress Notes (Signed)
Ref: Humberto Addo S, MD   Subjective:  Good diuresis with lasix for pulmonary congestion. T max 100.2 degree F.  Objective:  Vital Signs in the last 24 hours: Temp:  [97.3 F (36.3 C)-100.2 F (37.9 C)] 100.2 F (37.9 C) (01/03 1135) Pulse Rate:  [65-92] 85 (01/03 1135) Cardiac Rhythm:  [-] Normal sinus rhythm (01/03 0809) Resp:  [1-23] 12 (01/03 1135) BP: (140-161)/(63-84) 154/84 mmHg (01/03 1135) SpO2:  [94 %-100 %] 96 % (01/03 1445) Weight:  [62.7 kg (138 lb 3.7 oz)] 62.7 kg (138 lb 3.7 oz) (01/03 0450)  Physical Exam: BP Readings from Last 1 Encounters:  04/14/15 154/84    Wt Readings from Last 1 Encounters:  04/14/15 62.7 kg (138 lb 3.7 oz)    Weight change: 0.8 kg (1 lb 12.2 oz)  HEENT: [place]/AT, Eyes-Brown, PERL, EOMI, Conjunctiva-Pink, Sclera-Non-icteric Neck: No JVD, No bruit, Trachea midline. Lungs:  Clearing, Bilateral. Cardiac:  Regular rhythm, normal S1 and S2, no S3. III/VI systolic murmur. Abdomen:  Soft. Extremities:  No edema present. No cyanosis. No clubbing. CNS: AxOx0. Spontaneous right toe/lower leg movement. Skin: Warm and dry.   Intake/Output from previous day: 01/02 0701 - 01/03 0700 In: 1150 [I.V.:1150] Out: 550 [Urine:550]    Lab Results: BMET    Component Value Date/Time   NA 141 04/13/2015 0539   NA 139 04/12/2015 1157   NA 143 04/02/2015 1310   NA 141 03/12/2015 0348   NA 143 02/04/2015 0810   NA 144 06/24/2014 1220   NA 141 07/06/2011 0855   K 3.1* 04/13/2015 0539   K 3.4* 04/12/2015 1157   K 2.7* 04/02/2015 1310   K 3.4* 03/12/2015 0348   K 3.3* 02/04/2015 0810   K 3.9 06/24/2014 1220   K 3.6 07/06/2011 0855   CL 107 04/13/2015 0539   CL 104 04/12/2015 1157   CL 104 03/12/2015 0348   CL 105 09/17/2012 0801   CL 107 08/08/2012 0755   CL 105 06/27/2012 1121   CL 104 07/06/2011 0855   CO2 24 04/13/2015 0539   CO2 25 04/12/2015 1157   CO2 33* 04/02/2015 1310   CO2 29 03/12/2015 0348   CO2 29 02/04/2015 0810   CO2 26  06/24/2014 1220   CO2 28 07/06/2011 0855   GLUCOSE 145* 04/13/2015 0539   GLUCOSE 73 04/12/2015 1157   GLUCOSE 101 04/02/2015 1310   GLUCOSE 83 03/12/2015 0348   GLUCOSE 130 02/04/2015 0810   GLUCOSE 138 06/24/2014 1220   GLUCOSE 207* 09/17/2012 0801   GLUCOSE 202* 08/08/2012 0755   GLUCOSE 127* 06/27/2012 1121   GLUCOSE 90 07/06/2011 0855   BUN 16 04/13/2015 0539   BUN 21* 04/12/2015 1157   BUN 36.9* 04/02/2015 1310   BUN 14 03/12/2015 0348   BUN 22.3 02/04/2015 0810   BUN 17.4 06/24/2014 1220   BUN 18 07/06/2011 0855   CREATININE 1.24 04/13/2015 0539   CREATININE 1.36* 04/12/2015 1157   CREATININE 1.5* 04/02/2015 1310   CREATININE 1.11 03/12/2015 0348   CREATININE 1.5* 02/04/2015 0810   CREATININE 1.3 06/24/2014 1220   CREATININE 1.6* 07/06/2011 0855   CALCIUM 7.9* 04/13/2015 0539   CALCIUM 8.8* 04/12/2015 1157   CALCIUM 9.0 04/02/2015 1310   CALCIUM 8.5* 03/12/2015 0348   CALCIUM 9.1 02/04/2015 0810   CALCIUM 9.0 06/24/2014 1220   CALCIUM 8.8 07/06/2011 0855   GFRNONAA 51* 04/13/2015 0539   GFRNONAA 45* 04/12/2015 1157   GFRNONAA 58* 03/12/2015 0348   GFRAA 59* 04/13/2015  0539   GFRAA 53* 04/12/2015 1157   GFRAA >60 03/12/2015 0348   CBC    Component Value Date/Time   WBC 10.8* 04/13/2015 0539   WBC 3.8* 04/02/2015 1310   RBC 4.13* 04/13/2015 0539   RBC 3.84* 04/02/2015 1310   HGB 11.7* 04/13/2015 0539   HGB 11.1* 04/02/2015 1310   HCT 35.6* 04/13/2015 0539   HCT 33.9* 04/02/2015 1310   PLT 75* 04/13/2015 0539   PLT 84* 04/02/2015 1310   MCV 86.2 04/13/2015 0539   MCV 88.3 04/02/2015 1310   MCH 28.3 04/13/2015 0539   MCH 28.9 04/02/2015 1310   MCHC 32.9 04/13/2015 0539   MCHC 32.7 04/02/2015 1310   RDW 14.8 04/13/2015 0539   RDW 14.5 04/02/2015 1310   LYMPHSABS 0.4* 04/12/2015 1157   LYMPHSABS 1.3 04/02/2015 1310   MONOABS 0.4 04/12/2015 1157   MONOABS 0.5 04/02/2015 1310   EOSABS 0.0 04/12/2015 1157   EOSABS 0.1 04/02/2015 1310   BASOSABS 0.0  04/12/2015 1157   BASOSABS 0.0 04/02/2015 1310   HEPATIC Function Panel  Recent Labs  02/04/15 0810 03/10/15 1818 04/02/15 1310  PROT 7.0 6.3* 7.2   HEMOGLOBIN A1C No components found for: HGA1C,  MPG CARDIAC ENZYMES Lab Results  Component Value Date   TROPONINI <0.03 04/12/2015   TROPONINI 0.03 03/11/2015   TROPONINI 0.03 03/10/2015   BNP No results for input(s): PROBNP in the last 8760 hours. TSH  Recent Labs  01/16/15 1611  TSH 1.415   CHOLESTEROL No results for input(s): CHOL in the last 8760 hours.  Scheduled Meds: . albuterol  2.5 mg Nebulization TID  . antiseptic oral rinse  7 mL Mouth Rinse q12n4p  . cefTRIAXone (ROCEPHIN)  IV  2 g Intravenous Q24H  . chlorhexidine  15 mL Mouth Rinse BID  . enoxaparin (LOVENOX) injection  40 mg Subcutaneous Q24H  . insulin aspart  0-9 Units Subcutaneous 6 times per day   Continuous Infusions: . 0.9 % NaCl with KCl 20 mEq / L 10 mL/hr at 04/14/15 1040  . dextrose 5 % and 0.45 % NaCl with KCl 40 mEq/L 40 mL/hr at 04/14/15 1049   PRN Meds:.  Assessment/Plan: Acute pneumonia Altered Mental status Hypoglycemic attack Hypertension DM, II CAD Hypokalemia  Discussed care with son.  Consider Comfort care if not improving in next 24 to 48 hours. Potassium replacement. Decrease IV fluid by 50 %. Continue medical treatment.     LOS: 2 days    [name]  MD  04/14/2015, 2:52 PM

## 2015-04-14 NOTE — Progress Notes (Signed)
Utilization review completed. Ewa Hipp, RN, BSN. 

## 2015-04-15 LAB — BASIC METABOLIC PANEL
Anion gap: 10 (ref 5–15)
BUN: 11 mg/dL (ref 6–20)
CHLORIDE: 109 mmol/L (ref 101–111)
CO2: 24 mmol/L (ref 22–32)
CREATININE: 0.88 mg/dL (ref 0.61–1.24)
Calcium: 8 mg/dL — ABNORMAL LOW (ref 8.9–10.3)
Glucose, Bld: 100 mg/dL — ABNORMAL HIGH (ref 65–99)
POTASSIUM: 3.1 mmol/L — AB (ref 3.5–5.1)
SODIUM: 143 mmol/L (ref 135–145)

## 2015-04-15 LAB — GLUCOSE, CAPILLARY
GLUCOSE-CAPILLARY: 82 mg/dL (ref 65–99)
Glucose-Capillary: 102 mg/dL — ABNORMAL HIGH (ref 65–99)
Glucose-Capillary: 82 mg/dL (ref 65–99)
Glucose-Capillary: 86 mg/dL (ref 65–99)
Glucose-Capillary: 88 mg/dL (ref 65–99)
Glucose-Capillary: 93 mg/dL (ref 65–99)

## 2015-04-15 NOTE — Care Management Important Message (Signed)
Important Message  Patient Details  Name: Shane [name] MRN: [id number] Date of Birth: 1926/07/07   Medicare Important Message Given:  Yes    Courtland Reas P Gaylord Seydel 04/15/2015, 1:20 PM

## 2015-04-15 NOTE — Progress Notes (Signed)
Ref: Birdie Riddle, MD   Subjective:  Moving lower extremities spontaneously. T max 99.3 degree F  Objective:  Vital Signs in the last 24 hours: Temp:  [97.8 F (36.6 C)-99.3 F (37.4 C)] 98.4 F (36.9 C) (01/04 2106) Pulse Rate:  [67-92] 78 (01/04 2106) Cardiac Rhythm:  [-] Normal sinus rhythm (01/04 1900) Resp:  [11-22] 22 (01/04 2106) BP: (147-191)/(60-79) 176/71 mmHg (01/04 2106) SpO2:  [98 %-100 %] 98 % (01/04 2106) Weight:  [60.9 kg (134 lb 4.2 oz)] 60.9 kg (134 lb 4.2 oz) (01/04 0500)  Physical Exam: BP Readings from Last 1 Encounters:  04/15/15 176/71    Wt Readings from Last 1 Encounters:  04/15/15 60.9 kg (134 lb 4.2 oz)    Weight change: -1.8 kg (-3 lb 15.5 oz)  HEENT: [place]/AT, Eyes-Brown, PERL, Conjunctiva-Pink, Sclera-Non-icteric Neck: No JVD, No bruit, Trachea midline. Lungs:  Clearing, Bilateral. Cardiac:  Regular rhythm, normal S1 and S2, no S3. III/VI systolic murmur. Abdomen:  Soft, non-tender. Extremities:  No edema present. No cyanosis. No clubbing. CNS: AxOx0. Moves lower extremities.  Skin: Warm and dry.   Intake/Output from previous day: 01/03 0701 - 01/04 0700 In: 1060.7 [I.V.:1010.7; IV Piggyback:50] Out: 2650 [Urine:2575; Stool:75]    Lab Results: BMET    Component Value Date/Time   NA 143 04/15/2015 0447   NA 141 04/13/2015 0539   NA 139 04/12/2015 1157   NA 143 04/02/2015 1310   NA 143 02/04/2015 0810   NA 144 06/24/2014 1220   NA 141 07/06/2011 0855   K 3.1* 04/15/2015 0447   K 3.1* 04/13/2015 0539   K 3.4* 04/12/2015 1157   K 2.7* 04/02/2015 1310   K 3.3* 02/04/2015 0810   K 3.9 06/24/2014 1220   K 3.6 07/06/2011 0855   CL 109 04/15/2015 0447   CL 107 04/13/2015 0539   CL 104 04/12/2015 1157   CL 105 09/17/2012 0801   CL 107 08/08/2012 0755   CL 105 06/27/2012 1121   CL 104 07/06/2011 0855   CO2 24 04/15/2015 0447   CO2 24 04/13/2015 0539   CO2 25 04/12/2015 1157   CO2 33* 04/02/2015 1310   CO2 29 02/04/2015 0810   CO2 26 06/24/2014 1220   CO2 28 07/06/2011 0855   GLUCOSE 100* 04/15/2015 0447   GLUCOSE 145* 04/13/2015 0539   GLUCOSE 73 04/12/2015 1157   GLUCOSE 101 04/02/2015 1310   GLUCOSE 130 02/04/2015 0810   GLUCOSE 138 06/24/2014 1220   GLUCOSE 207* 09/17/2012 0801   GLUCOSE 202* 08/08/2012 0755   GLUCOSE 127* 06/27/2012 1121   GLUCOSE 90 07/06/2011 0855   BUN 11 04/15/2015 0447   BUN 16 04/13/2015 0539   BUN 21* 04/12/2015 1157   BUN 36.9* 04/02/2015 1310   BUN 22.3 02/04/2015 0810   BUN 17.4 06/24/2014 1220   BUN 18 07/06/2011 0855   CREATININE 0.88 04/15/2015 0447   CREATININE 1.24 04/13/2015 0539   CREATININE 1.36* 04/12/2015 1157   CREATININE 1.5* 04/02/2015 1310   CREATININE 1.5* 02/04/2015 0810   CREATININE 1.3 06/24/2014 1220   CREATININE 1.6* 07/06/2011 0855   CALCIUM 8.0* 04/15/2015 0447   CALCIUM 7.9* 04/13/2015 0539   CALCIUM 8.8* 04/12/2015 1157   CALCIUM 9.0 04/02/2015 1310   CALCIUM 9.1 02/04/2015 0810   CALCIUM 9.0 06/24/2014 1220   CALCIUM 8.8 07/06/2011 0855   GFRNONAA >60 04/15/2015 0447   GFRNONAA 51* 04/13/2015 0539   GFRNONAA 45* 04/12/2015 1157   GFRAA >60 04/15/2015 0447  GFRAA 59* 04/13/2015 0539   GFRAA 53* 04/12/2015 1157   CBC    Component Value Date/Time   WBC 10.8* 04/13/2015 0539   WBC 3.8* 04/02/2015 1310   RBC 4.13* 04/13/2015 0539   RBC 3.84* 04/02/2015 1310   HGB 11.7* 04/13/2015 0539   HGB 11.1* 04/02/2015 1310   HCT 35.6* 04/13/2015 0539   HCT 33.9* 04/02/2015 1310   PLT 75* 04/13/2015 0539   PLT 84* 04/02/2015 1310   MCV 86.2 04/13/2015 0539   MCV 88.3 04/02/2015 1310   MCH 28.3 04/13/2015 0539   MCH 28.9 04/02/2015 1310   MCHC 32.9 04/13/2015 0539   MCHC 32.7 04/02/2015 1310   RDW 14.8 04/13/2015 0539   RDW 14.5 04/02/2015 1310   LYMPHSABS 0.4* 04/12/2015 1157   LYMPHSABS 1.3 04/02/2015 1310   MONOABS 0.4 04/12/2015 1157   MONOABS 0.5 04/02/2015 1310   EOSABS 0.0 04/12/2015 1157   EOSABS 0.1 04/02/2015 1310    BASOSABS 0.0 04/12/2015 1157   BASOSABS 0.0 04/02/2015 1310   HEPATIC Function Panel  Recent Labs  02/04/15 0810 03/10/15 1818 04/02/15 1310  PROT 7.0 6.3* 7.2   HEMOGLOBIN A1C No components found for: HGA1C,  MPG CARDIAC ENZYMES Lab Results  Component Value Date   TROPONINI <0.03 04/12/2015   TROPONINI 0.03 03/11/2015   TROPONINI 0.03 03/10/2015   BNP No results for input(s): PROBNP in the last 8760 hours. TSH  Recent Labs  01/16/15 1611  TSH 1.415   CHOLESTEROL No results for input(s): CHOL in the last 8760 hours.  Scheduled Meds: . albuterol  2.5 mg Nebulization TID  . antiseptic oral rinse  7 mL Mouth Rinse q12n4p  . cefTRIAXone (ROCEPHIN)  IV  2 g Intravenous Q24H  . chlorhexidine  15 mL Mouth Rinse BID  . enoxaparin (LOVENOX) injection  40 mg Subcutaneous Q24H  . insulin aspart  0-9 Units Subcutaneous 6 times per day   Continuous Infusions: . 0.9 % NaCl with KCl 20 mEq / L 10 mL/hr at 04/15/15 0700  . dextrose 5 % and 0.45 % NaCl with KCl 40 mEq/L 40 mL/hr at 04/15/15 1206   PRN Meds:.acetaminophen  Assessment/Plan: Acute pneumonia Altered Mental status S/P Hypoglycemic attack Hypertension DM, II CAD Hypokalemia Moderate protein calorie malnutrition  Continue potassium supplement.     LOS: 3 days    [name]  MD  04/15/2015, 9:14 PM

## 2015-04-15 NOTE — Progress Notes (Signed)
Initial Nutrition Assessment  DOCUMENTATION CODES:   Severe malnutrition in context of chronic illness, Underweight  INTERVENTION:  Diet advancement as medically appropriate.   RD to continue to monitor.   NUTRITION DIAGNOSIS:   Malnutrition related to chronic illness as evidenced by percent weight loss, severe depletion of muscle mass.  GOAL:   Patient will meet greater than or equal to 90% of their needs  MONITOR:   Diet advancement, Weight trends, Labs, I & O's  REASON FOR ASSESSMENT:   Low Braden    ASSESSMENT:   82 y.o. male with a history of hypertension, hyperlipidemia who presents with altered mental status. He was last seen well 12/31, and was found in his recliner unresponsive the next morning. Blood glucose at the time was 36. His blood glucose was treated, but he continued to be unresponsive and was found to have pneumonia as well as urinary tract infection  Pt was asleep during time of visit and would not wake from attempted arousal. No family at bedside. RD unable to obtain nutrition history. Per Epic weight records, pt with a 9.5% weight loss in 2 months. Per MD note, comfort care to be considered if pt does not improve in the next 1-2 days.   Nutrition-Focused physical exam completed. Findings are moderate fat depletion, severe muscle depletion, and no edema.   Labs and medications reviewed.   Diet Order:  Diet NPO time specified  Skin:  Reviewed, no issues  Last BM:  1/3  Height:   Ht Readings from Last 1 Encounters:  04/12/15 6' (1.829 m)    Weight:   Wt Readings from Last 1 Encounters:  04/15/15 134 lb 4.2 oz (60.9 kg)    Ideal Body Weight:  80.9 kg  BMI:  Body mass index is 18.2 kg/(m^2).  Estimated Nutritional Needs:   Kcal:  1850-2050  Protein:  85-95 grams  Fluid:  Per MD  EDUCATION NEEDS:   No education needs identified at this time  Corrin Parker, MS, RD, LDN Pager # [phone] After hours/ weekend pager # [phone]

## 2015-04-16 ENCOUNTER — Inpatient Hospital Stay (HOSPITAL_COMMUNITY): Payer: Medicare Other

## 2015-04-16 DIAGNOSIS — E43 Unspecified severe protein-calorie malnutrition: Secondary | ICD-10-CM | POA: Insufficient documentation

## 2015-04-16 LAB — BASIC METABOLIC PANEL
Anion gap: 8 (ref 5–15)
BUN: 12 mg/dL (ref 6–20)
CALCIUM: 8.3 mg/dL — AB (ref 8.9–10.3)
CO2: 24 mmol/L (ref 22–32)
CREATININE: 0.79 mg/dL (ref 0.61–1.24)
Chloride: 113 mmol/L — ABNORMAL HIGH (ref 101–111)
GFR calc non Af Amer: >60 mL/min (ref >60)
GLUCOSE: 98 mg/dL (ref 65–99)
Potassium: 3.2 mmol/L — ABNORMAL LOW (ref 3.5–5.1)
Sodium: 145 mmol/L (ref 135–145)

## 2015-04-16 LAB — GLUCOSE, CAPILLARY
GLUCOSE-CAPILLARY: 74 mg/dL (ref 65–99)
GLUCOSE-CAPILLARY: 71 mg/dL (ref 65–99)
GLUCOSE-CAPILLARY: 92 mg/dL (ref 65–99)
GLUCOSE-CAPILLARY: 102 mg/dL — AB (ref 65–99)
Glucose-Capillary: 84 mg/dL (ref 65–99)
Glucose-Capillary: 92 mg/dL (ref 65–99)

## 2015-04-16 MED ORDER — ENOXAPARIN SODIUM 30 MG/0.3ML ~~LOC~~ SOLN
30.0000 mg | SUBCUTANEOUS | Status: DC
  Administered 2015-04-17: 30 mg via SUBCUTANEOUS
  Filled 2015-04-16: qty 0.3

## 2015-04-16 NOTE — Progress Notes (Signed)
Nutrition Follow-up  DOCUMENTATION CODES:   Severe malnutrition in context of chronic illness, Underweight  INTERVENTION:  Recommendations: Initiate Jevity 1.2 @ 20 ml/hr via Cortrak NGT and increase by 10 ml every 4 hours to goal rate of 70 ml/hr.   Tube feeding regimen provides 2016 kcal (100% of needs), 93 grams of protein, and 1361 ml of H2O.   Monitor magnesium, potassium, and phosphorus daily for at least 3 days, MD to replete as needed, as pt is at risk for refeeding syndrome given underweight status and NPO >  5days.   NUTRITION DIAGNOSIS:   Malnutrition related to chronic illness as evidenced by percent weight loss, severe depletion of muscle mass.  Ongoing  GOAL:   Patient will meet greater than or equal to 90% of their needs  Unmet  MONITOR:   Diet advancement, Weight trends, Labs, I & O's  REASON FOR ASSESSMENT:   Low Braden    ASSESSMENT:   82 y.o. male with a history of hypertension, hyperlipidemia who presents with altered mental status. He was last seen well 12/31, and was found in his recliner unresponsive the next morning. Blood glucose at the time was 36. His blood glucose was treated, but he continued to be unresponsive and was found to have pneumonia as well as urinary tract infection  Pt remains NPO. Pt is very lethargic, responds to painful stimuli, but does not awake. Pt now has Cortrak NGT in place. Per RN, awaiting consent from pt's son prior to initiating tube feedings.   Labs: low potassium, low calcium  Diet Order:  Diet NPO time specified  Skin:  Reviewed, no issues  Last BM:  1/3  Height:   Ht Readings from Last 1 Encounters:  04/12/15 6' (1.829 m)    Weight:   Wt Readings from Last 1 Encounters:  04/16/15 138 lb 3.7 oz (62.7 kg)    Ideal Body Weight:  80.9 kg  BMI:  Body mass index is 18.74 kg/(m^2).  Estimated Nutritional Needs:   Kcal:  1850-2050  Protein:  85-95 grams  Fluid:  Per MD  EDUCATION NEEDS:   No  education needs identified at this time  Scarlette Ar RD, LDN Inpatient Clinical Dietitian Pager: [phone] After Hours Pager: [phone]

## 2015-04-16 NOTE — Progress Notes (Signed)
Dr. Doylene Canard aware of cardiac rhymth this AM. MD also request for pt to have feeding tube in place at this time. RD aware of order.   Ave Filter, RN

## 2015-04-16 NOTE — Progress Notes (Signed)
Ref: Birdie Riddle, MD   Subjective:  Afebrile. Moves lower legs. Flaccid upper extremities.  Objective:  Vital Signs in the last 24 hours: Temp:  [98.4 F (36.9 C)-98.9 F (37.2 C)] 98.9 F (37.2 C) (01/05 1736) Pulse Rate:  [54-85] 60 (01/05 1736) Cardiac Rhythm:  [-] Other (Comment) (01/05 1029) Resp:  [20-22] 20 (01/05 1736) BP: (150-178)/(66-116) 178/84 mmHg (01/05 1736) SpO2:  [97 %-100 %] 100 % (01/05 1736) Weight:  [62.7 kg (138 lb 3.7 oz)] 62.7 kg (138 lb 3.7 oz) (01/05 0444)  Physical Exam: BP Readings from Last 1 Encounters:  04/16/15 178/84    Wt Readings from Last 1 Encounters:  04/16/15 62.7 kg (138 lb 3.7 oz)    Weight change: 1.8 kg (3 lb 15.5 oz)  HEENT: [place]/AT, Eyes-Brown, PERL, Conjunctiva-Pink, Sclera-Non-icteric Neck: No JVD, No bruit, Trachea midline. Lungs:  Clearing, Bilateral. Cardiac:  Regular rhythm, normal S1 and S2, no S3. III/VI systolic mirmur Abdomen:  Soft, non-tender. Extremities:  No edema present. No cyanosis. No clubbing. CNS: AxOx0, moves lower extremities only.  Skin: Warm and dry.   Intake/Output from previous day: 01/04 0701 - 01/05 0700 In: 400 [I.V.:350; IV Piggyback:50] Out: 700 [Urine:700]    Lab Results: BMET    Component Value Date/Time   NA 145 04/16/2015 0546   NA 143 04/15/2015 0447   NA 141 04/13/2015 0539   NA 143 04/02/2015 1310   NA 143 02/04/2015 0810   NA 144 06/24/2014 1220   NA 141 07/06/2011 0855   K 3.2* 04/16/2015 0546   K 3.1* 04/15/2015 0447   K 3.1* 04/13/2015 0539   K 2.7* 04/02/2015 1310   K 3.3* 02/04/2015 0810   K 3.9 06/24/2014 1220   K 3.6 07/06/2011 0855   CL 113* 04/16/2015 0546   CL 109 04/15/2015 0447   CL 107 04/13/2015 0539   CL 105 09/17/2012 0801   CL 107 08/08/2012 0755   CL 105 06/27/2012 1121   CL 104 07/06/2011 0855   CO2 24 04/16/2015 0546   CO2 24 04/15/2015 0447   CO2 24 04/13/2015 0539   CO2 33* 04/02/2015 1310   CO2 29 02/04/2015 0810   CO2 26 06/24/2014 1220    CO2 28 07/06/2011 0855   GLUCOSE 98 04/16/2015 0546   GLUCOSE 100* 04/15/2015 0447   GLUCOSE 145* 04/13/2015 0539   GLUCOSE 101 04/02/2015 1310   GLUCOSE 130 02/04/2015 0810   GLUCOSE 138 06/24/2014 1220   GLUCOSE 207* 09/17/2012 0801   GLUCOSE 202* 08/08/2012 0755   GLUCOSE 127* 06/27/2012 1121   GLUCOSE 90 07/06/2011 0855   BUN 12 04/16/2015 0546   BUN 11 04/15/2015 0447   BUN 16 04/13/2015 0539   BUN 36.9* 04/02/2015 1310   BUN 22.3 02/04/2015 0810   BUN 17.4 06/24/2014 1220   BUN 18 07/06/2011 0855   CREATININE 0.79 04/16/2015 0546   CREATININE 0.88 04/15/2015 0447   CREATININE 1.24 04/13/2015 0539   CREATININE 1.5* 04/02/2015 1310   CREATININE 1.5* 02/04/2015 0810   CREATININE 1.3 06/24/2014 1220   CREATININE 1.6* 07/06/2011 0855   CALCIUM 8.3* 04/16/2015 0546   CALCIUM 8.0* 04/15/2015 0447   CALCIUM 7.9* 04/13/2015 0539   CALCIUM 9.0 04/02/2015 1310   CALCIUM 9.1 02/04/2015 0810   CALCIUM 9.0 06/24/2014 1220   CALCIUM 8.8 07/06/2011 0855   GFRNONAA >60 04/16/2015 0546   GFRNONAA >60 04/15/2015 0447   GFRNONAA 51* 04/13/2015 0539   GFRAA >60 04/16/2015 0546   GFRAA >  60 04/15/2015 0447   GFRAA 59* 04/13/2015 0539   CBC    Component Value Date/Time   WBC 10.8* 04/13/2015 0539   WBC 3.8* 04/02/2015 1310   RBC 4.13* 04/13/2015 0539   RBC 3.84* 04/02/2015 1310   HGB 11.7* 04/13/2015 0539   HGB 11.1* 04/02/2015 1310   HCT 35.6* 04/13/2015 0539   HCT 33.9* 04/02/2015 1310   PLT 75* 04/13/2015 0539   PLT 84* 04/02/2015 1310   MCV 86.2 04/13/2015 0539   MCV 88.3 04/02/2015 1310   MCH 28.3 04/13/2015 0539   MCH 28.9 04/02/2015 1310   MCHC 32.9 04/13/2015 0539   MCHC 32.7 04/02/2015 1310   RDW 14.8 04/13/2015 0539   RDW 14.5 04/02/2015 1310   LYMPHSABS 0.4* 04/12/2015 1157   LYMPHSABS 1.3 04/02/2015 1310   MONOABS 0.4 04/12/2015 1157   MONOABS 0.5 04/02/2015 1310   EOSABS 0.0 04/12/2015 1157   EOSABS 0.1 04/02/2015 1310   BASOSABS 0.0 04/12/2015 1157    BASOSABS 0.0 04/02/2015 1310   HEPATIC Function Panel  Recent Labs  02/04/15 0810 03/10/15 1818 04/02/15 1310  PROT 7.0 6.3* 7.2   HEMOGLOBIN A1C No components found for: HGA1C,  MPG CARDIAC ENZYMES Lab Results  Component Value Date   TROPONINI <0.03 04/12/2015   TROPONINI 0.03 03/11/2015   TROPONINI 0.03 03/10/2015   BNP No results for input(s): PROBNP in the last 8760 hours. TSH  Recent Labs  01/16/15 1611  TSH 1.415   CHOLESTEROL No results for input(s): CHOL in the last 8760 hours.  Scheduled Meds: . albuterol  2.5 mg Nebulization TID  . antiseptic oral rinse  7 mL Mouth Rinse q12n4p  . cefTRIAXone (ROCEPHIN)  IV  2 g Intravenous Q24H  . chlorhexidine  15 mL Mouth Rinse BID  . [START ON 04/17/2015] enoxaparin (LOVENOX) injection  30 mg Subcutaneous Q24H  . insulin aspart  0-9 Units Subcutaneous 6 times per day   Continuous Infusions: . 0.9 % NaCl with KCl 20 mEq / L 10 mL/hr at 04/15/15 0700  . dextrose 5 % and 0.45 % NaCl with KCl 40 mEq/L 40 mL/hr at 04/16/15 1226   PRN Meds:.acetaminophen  Assessment/Plan: Acute pneumonia Altered Mental status S/P Hypoglycemic attack Hypertension DM, II CAD Hypokalemia Severe protein calorie malnutrition  Place feeding tube.     LOS: 4 days    [name]  MD  04/16/2015, 6:00 PM

## 2015-04-17 ENCOUNTER — Inpatient Hospital Stay (HOSPITAL_COMMUNITY): Payer: Medicare Other

## 2015-04-17 DIAGNOSIS — G9340 Encephalopathy, unspecified: Secondary | ICD-10-CM

## 2015-04-17 DIAGNOSIS — R4182 Altered mental status, unspecified: Secondary | ICD-10-CM

## 2015-04-17 LAB — GLUCOSE, CAPILLARY
GLUCOSE-CAPILLARY: 108 mg/dL — AB (ref 65–99)
GLUCOSE-CAPILLARY: 115 mg/dL — AB (ref 65–99)
GLUCOSE-CAPILLARY: 94 mg/dL (ref 65–99)
Glucose-Capillary: 117 mg/dL — ABNORMAL HIGH (ref 65–99)
Glucose-Capillary: 84 mg/dL (ref 65–99)
Glucose-Capillary: 114 mg/dL — ABNORMAL HIGH (ref 65–99)

## 2015-04-17 LAB — CSF CULTURE

## 2015-04-17 LAB — CSF CULTURE W GRAM STAIN: Culture: NO GROWTH

## 2015-04-17 MED ORDER — VANCOMYCIN HCL 500 MG IV SOLR
500.0000 mg | Freq: Two times a day (BID) | INTRAVENOUS | Status: DC
  Administered 2015-04-18 – 2015-04-21 (×7): 500 mg via INTRAVENOUS
  Filled 2015-04-17 (×8): qty 500

## 2015-04-17 MED ORDER — POTASSIUM CHLORIDE CRYS ER 20 MEQ PO TBCR
40.0000 meq | EXTENDED_RELEASE_TABLET | Freq: Once | ORAL | Status: AC
  Administered 2015-04-18: 40 meq via ORAL
  Filled 2015-04-17: qty 2

## 2015-04-17 MED ORDER — VANCOMYCIN HCL IN DEXTROSE 1-5 GM/200ML-% IV SOLN
1000.0000 mg | Freq: Once | INTRAVENOUS | Status: AC
  Administered 2015-04-18: 1000 mg via INTRAVENOUS
  Filled 2015-04-17: qty 200

## 2015-04-17 MED ORDER — AMIODARONE HCL 200 MG PO TABS
400.0000 mg | ORAL_TABLET | Freq: Two times a day (BID) | ORAL | Status: DC
  Administered 2015-04-18 – 2015-04-19 (×3): 400 mg via ORAL
  Filled 2015-04-17 (×3): qty 2

## 2015-04-17 MED ORDER — JEVITY 1.2 CAL PO LIQD
1000.0000 mL | ORAL | Status: DC
  Administered 2015-04-17: 20 mL
  Filled 2015-04-17 (×3): qty 1000

## 2015-04-17 MED ORDER — AMIODARONE IV BOLUS ONLY 150 MG/100ML
150.0000 mg | Freq: Once | INTRAVENOUS | Status: AC
  Administered 2015-04-17: 150 mg via INTRAVENOUS
  Filled 2015-04-17: qty 100

## 2015-04-17 MED ORDER — JEVITY 1.2 CAL PO LIQD
1000.0000 mL | ORAL | Status: DC
  Administered 2015-04-20: 1000 mL
  Filled 2015-04-17 (×2): qty 1000
  Filled 2015-04-17: qty 237

## 2015-04-17 MED ORDER — PIPERACILLIN-TAZOBACTAM 3.375 G IVPB
3.3750 g | Freq: Three times a day (TID) | INTRAVENOUS | Status: DC
  Administered 2015-04-18 (×2): 3.375 g via INTRAVENOUS
  Filled 2015-04-17 (×4): qty 50

## 2015-04-17 NOTE — Progress Notes (Signed)
Subjective: patient remains somnolent. Opens eyes to sternal rub but does not blink to threat.   Exam: Filed Vitals:   04/17/15 0233 04/17/15 1040  BP: 159/85 153/73  Pulse: 84 93  Temp: 97.8 F (36.6 C) 99.5 F (37.5 C)  Resp: 20 20        Gen: In bed, NAD MS: somnolent and opens eyes to sternal rub only. Gaze initially midline but then becomes leftward.   CN: PERRLA, left gaze, no blink to threat, face symmetric Motor: flaccid throughout with no rigidity.  Sensory: no withdrawal to noxious stim.    Pertinent Labs: Brain MRI, CSF studies are negative for any acute pathology. EEG suggestive of encephalopathy, no seizures  Etta Quill PA-C [facility] [phone]  Impression:  82 year old male patient with acute encephalopathy, no clear etiology known, except for that he had severe hypoglycemia at presentation. Possible severe hypoglycemia related to metabolic encephalopathy. Will repeat EEG to ensure no NCSE.   Recommendations: 1) EEG    04/17/2015, 10:56 AM

## 2015-04-17 NOTE — Procedures (Signed)
ELECTROENCEPHALOGRAM REPORT  Date of Study: 04/17/2015  Patient's Name: Shane [name] MRN: [id number] Date of Birth: 07-05-1926  Referring Provider: Etta Quill, PA-C  Clinical History: This is an 81 year old man with acute encephalopathy  Medications: acetaminophen (TYLENOL) suppository 650 mg albuterol (PROVENTIL) (2.5 MG/3ML) 0.083% nebulizer solution 2.5 mg cefTRIAXone (ROCEPHIN) 2 g in dextrose 5 % 50 mL IVPB insulin aspart (novoLOG) injection 0-9 Units  Technical Summary: A multichannel digital EEG recording measured by the international 10-20 system with electrodes applied with paste and impedances below 5000 ohms performed as portable with EKG monitoring in a predominantly drowsy and asleep patient.  Hyperventilation and photic stimulation were not performed.  The digital EEG was referentially recorded, reformatted, and digitally filtered in a variety of bipolar and referential montages for optimal display.   Description: The patient is predominantly drowsy and asleep during the recording. There is no clear posterior dominant rhythm. The background consists of a large amount of diffuse 4-5 Hz theta and 2-3 Hz delta slowing with occasional triphasic waves seen occurring intermittently at a frequency of 1 per second without evolution in frequency or amplitude. Normal sleep architecture is not seen. With noxious stimulation, there is an increase in muscle artifact. Hyperventilation and photic stimulation were not performed.  There were no epileptiform discharges or electrographic seizures seen.    EKG lead showed irregular rhythm.  Impression: This predominantly drowsy and asleep EEG is abnormal due to moderate diffuse slowing of the background with occasional triphasic waves noted.  Clinical Correlation of the above findings indicates diffuse cerebral dysfunction that is non-specific in etiology and can be seen with hypoxic/ischemic injury, toxic/metabolic encephalopathies, or  medication effect. Triphasic waves are typically seen in hepatic encephalopathy, but can been seen with other metabolic encephalopathies as well.  There were no epileptiform discharges or electrographic seizures seen in this study.      Ellouise Newer, M.D.

## 2015-04-17 NOTE — Progress Notes (Signed)
EEG Completed; Results Pending  

## 2015-04-17 NOTE — Progress Notes (Signed)
Called per floor RN regarding Pt with increased lethargy, increased RR, and increased HR. RN advised to check rectal temp and EKG wile RRT en route. Rectal temp done yielding 101.2.EKG completed, slight ST elevation in leads AVR, V1, ST depression in V5, V6 and PVCs noted. Upon my arrival at 2211 Pt found resting in bed, somnolent does not respond to sternal rub, moans with repositioning only. HR 120-130, RR 20-25, P02 sat 93-95% BP 142/72. Unclear how different this assessment is from dayshift baseline. Lung sounds course, diminished in bases, scattered rhonchi in left upper quadrant. Lactic Acid order placed. Dr. Terrence Dupont called per floor RN updated on Pt status, Amiodarone IV bolus,  EKG and lactic acid ordered. Blood Cultures, labs, CXR and ABX ordered per MD. Amiodarone bolus given and VS checked before and after. RN advised to monitor Pt closely and notify myself and provider for worsening changes.

## 2015-04-17 NOTE — Care Management Note (Addendum)
Case Management Note  Patient Details  Name: Arlon Tinkey MRN: [id number] Date of Birth: 18-Dec-1926  Subjective/Objective:    Patient admitted with altered mental status. Patient found to have low CBG and PNA. Pt on IV abx. He is from home with his wife.  He is active with [facility] for [facility] aide.               Action/Plan: CM continuing to follow for discharge needs.   Expected Discharge Date:                  Expected Discharge Plan:     In-House Referral:     Discharge planning Services     Post Acute Care Choice:    Choice offered to:     DME Arranged:    DME Agency:     HH Arranged:    HH Agency:     Status of Service:  In process, will continue to follow  Medicare Important Message Given:  Yes Date Medicare IM Given:    Medicare IM give by:    Date Additional Medicare IM Given:    Additional Medicare Important Message give by:     If discussed at [place] of Stay Meetings, dates discussed:    Additional Comments:  Pollie Friar, RN 04/17/2015, 3:56 PM

## 2015-04-17 NOTE — Progress Notes (Signed)
Md aware of Bp and temp. Tylenol given. Patient made a partial code.  Will continue to monitor.  Dylen Mcelhannon, Mervin Kung RN

## 2015-04-17 NOTE — Progress Notes (Signed)
Nutrition Follow-up  DOCUMENTATION CODES:   Severe malnutrition in context of chronic illness, Underweight  INTERVENTION:  Initiate Jevity 1.2 @ 20 ml/hr via Cortrak NGT and increase by 10 ml every 8 hours to goal rate of 70 ml/hr.   Tube feeding regimen provides 2016 kcal (100% of needs), 93 grams of protein, and 1361 ml of H2O.   Monitor magnesium, potassium, and phosphorus daily for at least 3 days, MD to replete as needed, as pt is at risk for refeeding syndrome given underweight status and NPO > 5days.   NUTRITION DIAGNOSIS:   Malnutrition related to chronic illness as evidenced by percent weight loss, severe depletion of muscle mass.  Ongoing  GOAL:   Patient will meet greater than or equal to 90% of their needs  Unmet  MONITOR:   Diet advancement, Weight trends, Labs, I & O's  REASON FOR ASSESSMENT:   Consult Enteral/tube feeding initiation and management  ASSESSMENT:   82 y.o. male with a history of hypertension, hyperlipidemia who presents with altered mental status. He was last seen well 12/31, and was found in his recliner unresponsive the next morning. Blood glucose at the time was 36. His blood glucose was treated, but he continued to be unresponsive and was found to have pneumonia as well as urinary tract infection  RD consulted for enteral/ tube feeding initiation and management. Pt has Cortak NGT in place. Will start Jevity 1.2 @ 20 ml/hr and increase every 8 hours.   Labs: low potassium, elevated chloride, low calcium  Diet Order:  Diet NPO time specified  Skin:  Reviewed, no issues  Last BM:  1/3  Height:   Ht Readings from Last 1 Encounters:  04/12/15 6' (1.829 m)    Weight:   Wt Readings from Last 1 Encounters:  04/17/15 138 lb 10.7 oz (62.9 kg)    Ideal Body Weight:  80.9 kg  BMI:  Body mass index is 18.8 kg/(m^2).  Estimated Nutritional Needs:   Kcal:  1850-2050  Protein:  85-95 grams  Fluid:  Per MD  EDUCATION NEEDS:    No education needs identified at this time  Scarlette Ar RD, LDN Inpatient Clinical Dietitian Pager: [phone] After Hours Pager: [phone]

## 2015-04-17 NOTE — Progress Notes (Signed)
ANTIBIOTIC CONSULT NOTE - INITIAL  Pharmacy Consult for Zosyn and Vancomycin Indication: sepsis  Allergies  Allergen Reactions  . Lisinopril Cough    Patient Measurements: Height: 6' (182.9 cm) Weight: 138 lb 10.7 oz (62.9 kg) IBW/kg (Calculated) : 77.6  Vital Signs: Temp: 101.2 F (38.4 C) (01/06 2211) Temp Source: Rectal (01/06 2211) BP: 154/68 mmHg (01/06 2147) Pulse Rate: 70 (01/06 2147) Intake/Output from previous day: 01/05 0701 - 01/06 0700 In: 1500 [I.V.:1450; IV Piggyback:50] Out: 700 [Urine:700] Intake/Output from this shift:    Labs:  Recent Labs  04/15/15 0447 04/16/15 0546  CREATININE 0.88 0.79   Estimated Creatinine Clearance: 59 mL/min (by C-G formula based on Cr of 0.79). No results for input(s): VANCOTROUGH, VANCOPEAK, VANCORANDOM, GENTTROUGH, GENTPEAK, GENTRANDOM, TOBRATROUGH, TOBRAPEAK, TOBRARND, AMIKACINPEAK, AMIKACINTROU, AMIKACIN in the last 72 hours.   Microbiology: Recent Results (from the past 720 hour(s))  MRSA PCR Screening     Status: None   Collection Time: 04/12/15  4:00 PM  Result Value Ref Range Status   MRSA by PCR NEGATIVE NEGATIVE Final    Comment:        The GeneXpert MRSA Assay (FDA approved for NASAL specimens only), is one component of a comprehensive MRSA colonization surveillance program. It is not intended to diagnose MRSA infection nor to guide or monitor treatment for MRSA infections.   CSF culture     Status: None   Collection Time: 04/13/15 12:58 AM  Result Value Ref Range Status   Specimen Description CSF  Final   Special Requests NONE  Final   Gram Stain   Final    CYTOSPIN SMEAR WBC PRESENT, PREDOMINANTLY PMN NO ORGANISMS SEEN    Culture NO GROWTH 3 DAYS  Final   Report Status 04/17/2015 FINAL  Final  Culture, blood (single)     Status: None (Preliminary result)   Collection Time: 04/13/15 10:40 PM  Result Value Ref Range Status   Specimen Description BLOOD RIGHT HAND  Final   Special Requests  BOTTLES DRAWN AEROBIC ONLY 5CC  Final   Culture NO GROWTH 4 DAYS  Final   Report Status PENDING  Incomplete    Assessment: 24 [place] who presented to [facility] with altered mental status. He was treated for pneumonia and UTI but continued to be unresponsive.  Patient currently with tmax of 102.9, last wbc was 10.8 from 1/2. Scr was normal on 1/5. New orders to restart broad abx with vancomycin and zosyn.  Patient has been receiving ceftriaxone since 1/1, now stopped.  Goal of Therapy:  Vancomycin trough level 15-20 mcg/ml  Plan:  -Vancomycin 1 mg IV once followed by 500 mg q12 hours -d/c Ceftriaxone  -Zosyn 3.375g IV q8 hours -Bmet in am -Monitor CBC, renal fx and cultures -Vanc trough at [place] PharmD., BCPS Clinical Pharmacist Pager [phone] 04/17/2015 10:30 PM

## 2015-04-17 NOTE — Progress Notes (Signed)
Ref: Shane Zion S, MD   Subjective:  T max 102.9. Repeat EEG essentially unchanged and suggestive for encephalopathy. Minimal arousal with deep stimulation. Flaccid upper extremities.  Objective:  Vital Signs in the last 24 hours: Temp:  [97.8 F (36.6 C)-102.9 F (39.4 C)] 102.9 F (39.4 C) (01/06 1425) Pulse Rate:  [59-97] 97 (01/06 1425) Cardiac Rhythm:  [-] Normal sinus rhythm (01/05 1900) Resp:  [18-20] 20 (01/06 1425) BP: (153-194)/(73-87) 194/86 mmHg (01/06 1425) SpO2:  [91 %-100 %] 91 % (01/06 1425) Weight:  [62.9 kg (138 lb 10.7 oz)] 62.9 kg (138 lb 10.7 oz) (01/06 0500)  Physical Exam: BP Readings from Last 1 Encounters:  04/17/15 194/86    Wt Readings from Last 1 Encounters:  04/17/15 62.9 kg (138 lb 10.7 oz)    Weight change: 0.2 kg (7.1 oz)  HEENT: [place]/AT, Eyes-Brown, PERL, Conjunctiva-Pink, Sclera-Non-icteric Neck: No JVD, No bruit, Trachea midline. Lungs:  Coarse crqckles, Bilateral. Cardiac:  Regular rhythm, normal S1 and S2, no S3. III/VI systolic murmur. Abdomen:  Soft, non-tender. Extremities:  No edema present. No cyanosis. No clubbing. CNS: AxOx0, moves lower extremities occasionally and opens eyes occasionally to hard stimulation.  Skin: Warm and dry.   Intake/Output from previous day: 01/05 0701 - 01/06 0700 In: 1500 [I.V.:1450; IV Piggyback:50] Out: 700 [Urine:700]    Lab Results: BMET    Component Value Date/Time   NA 145 04/16/2015 0546   NA 143 04/15/2015 0447   NA 141 04/13/2015 0539   NA 143 04/02/2015 1310   NA 143 02/04/2015 0810   NA 144 06/24/2014 1220   NA 141 07/06/2011 0855   K 3.2* 04/16/2015 0546   K 3.1* 04/15/2015 0447   K 3.1* 04/13/2015 0539   K 2.7* 04/02/2015 1310   K 3.3* 02/04/2015 0810   K 3.9 06/24/2014 1220   K 3.6 07/06/2011 0855   CL 113* 04/16/2015 0546   CL 109 04/15/2015 0447   CL 107 04/13/2015 0539   CL 105 09/17/2012 0801   CL 107 08/08/2012 0755   CL 105 06/27/2012 1121   CL 104 07/06/2011  0855   CO2 24 04/16/2015 0546   CO2 24 04/15/2015 0447   CO2 24 04/13/2015 0539   CO2 33* 04/02/2015 1310   CO2 29 02/04/2015 0810   CO2 26 06/24/2014 1220   CO2 28 07/06/2011 0855   GLUCOSE 98 04/16/2015 0546   GLUCOSE 100* 04/15/2015 0447   GLUCOSE 145* 04/13/2015 0539   GLUCOSE 101 04/02/2015 1310   GLUCOSE 130 02/04/2015 0810   GLUCOSE 138 06/24/2014 1220   GLUCOSE 207* 09/17/2012 0801   GLUCOSE 202* 08/08/2012 0755   GLUCOSE 127* 06/27/2012 1121   GLUCOSE 90 07/06/2011 0855   BUN 12 04/16/2015 0546   BUN 11 04/15/2015 0447   BUN 16 04/13/2015 0539   BUN 36.9* 04/02/2015 1310   BUN 22.3 02/04/2015 0810   BUN 17.4 06/24/2014 1220   BUN 18 07/06/2011 0855   CREATININE 0.79 04/16/2015 0546   CREATININE 0.88 04/15/2015 0447   CREATININE 1.24 04/13/2015 0539   CREATININE 1.5* 04/02/2015 1310   CREATININE 1.5* 02/04/2015 0810   CREATININE 1.3 06/24/2014 1220   CREATININE 1.6* 07/06/2011 0855   CALCIUM 8.3* 04/16/2015 0546   CALCIUM 8.0* 04/15/2015 0447   CALCIUM 7.9* 04/13/2015 0539   CALCIUM 9.0 04/02/2015 1310   CALCIUM 9.1 02/04/2015 0810   CALCIUM 9.0 06/24/2014 1220   CALCIUM 8.8 07/06/2011 0855   GFRNONAA >60 04/16/2015 0546  GFRNONAA >60 04/15/2015 0447   GFRNONAA 51* 04/13/2015 0539   GFRAA >60 04/16/2015 0546   GFRAA >60 04/15/2015 0447   GFRAA 59* 04/13/2015 0539   CBC    Component Value Date/Time   WBC 10.8* 04/13/2015 0539   WBC 3.8* 04/02/2015 1310   RBC 4.13* 04/13/2015 0539   RBC 3.84* 04/02/2015 1310   HGB 11.7* 04/13/2015 0539   HGB 11.1* 04/02/2015 1310   HCT 35.6* 04/13/2015 0539   HCT 33.9* 04/02/2015 1310   PLT 75* 04/13/2015 0539   PLT 84* 04/02/2015 1310   MCV 86.2 04/13/2015 0539   MCV 88.3 04/02/2015 1310   MCH 28.3 04/13/2015 0539   MCH 28.9 04/02/2015 1310   MCHC 32.9 04/13/2015 0539   MCHC 32.7 04/02/2015 1310   RDW 14.8 04/13/2015 0539   RDW 14.5 04/02/2015 1310   LYMPHSABS 0.4* 04/12/2015 1157   LYMPHSABS 1.3 04/02/2015  1310   MONOABS 0.4 04/12/2015 1157   MONOABS 0.5 04/02/2015 1310   EOSABS 0.0 04/12/2015 1157   EOSABS 0.1 04/02/2015 1310   BASOSABS 0.0 04/12/2015 1157   BASOSABS 0.0 04/02/2015 1310   HEPATIC Function Panel  Recent Labs  02/04/15 0810 03/10/15 1818 04/02/15 1310  PROT 7.0 6.3* 7.2   HEMOGLOBIN A1C No components found for: HGA1C,  MPG CARDIAC ENZYMES Lab Results  Component Value Date   TROPONINI <0.03 04/12/2015   TROPONINI 0.03 03/11/2015   TROPONINI 0.03 03/10/2015   BNP No results for input(Shane [name]): PROBNP in the last 8760 hours. TSH  Recent Labs  01/16/15 1611  TSH 1.415   CHOLESTEROL No results for input(Shane [name]): CHOL in the last 8760 hours.  Scheduled Meds: . albuterol  2.5 mg Nebulization TID  . antiseptic oral rinse  7 mL Mouth Rinse q12n4p  . cefTRIAXone (ROCEPHIN)  IV  2 g Intravenous Q24H  . chlorhexidine  15 mL Mouth Rinse BID  . enoxaparin (LOVENOX) injection  30 mg Subcutaneous Q24H  . insulin aspart  0-9 Units Subcutaneous 6 times per day   Continuous Infusions: . dextrose 5 % and 0.45 % NaCl with KCl 40 mEq/L 40 mL/hr at 04/16/15 1226  . feeding supplement (JEVITY 1.2 CAL)     PRN Meds:.acetaminophen  Assessment/Plan: Acute pneumonia Altered Mental status Shane [name]/P Hypoglycemic attack Hypertension DM, II CAD Hypokalemia Severe protein calorie malnutrition  Discussed care with son and agrees to limited code but wants to continue medical treatment for additional 48-72 hours. Dr. Terrence Dupont covering over weekend.     LOS: 5 days    [name]  MD  04/17/2015, 3:40 PM

## 2015-04-18 LAB — CBC
HCT: 34.9 % — ABNORMAL LOW (ref 39.0–52.0)
HCT: 38.2 % — ABNORMAL LOW (ref 39.0–52.0)
Hemoglobin: 12.1 g/dL — ABNORMAL LOW (ref 13.0–17.0)
Hemoglobin: 12.8 g/dL — ABNORMAL LOW (ref 13.0–17.0)
MCH: 29 pg (ref 26.0–34.0)
MCH: 29.3 pg (ref 26.0–34.0)
MCHC: 33.5 g/dL (ref 30.0–36.0)
MCHC: 34.7 g/dL (ref 30.0–36.0)
MCV: 84.5 fL (ref 78.0–100.0)
MCV: 86.6 fL (ref 78.0–100.0)
PLATELETS: 78 10*3/uL — AB (ref 150–400)
PLATELETS: 93 10*3/uL — AB (ref 150–400)
RBC: 4.41 MIL/uL (ref 4.22–5.81)
RBC: 4.13 MIL/uL — AB (ref 4.22–5.81)
RDW: 14.8 % (ref 11.5–15.5)
RDW: 15 % (ref 11.5–15.5)
WBC: 8.7 10*3/uL (ref 4.0–10.5)
WBC: 7.5 10*3/uL (ref 4.0–10.5)

## 2015-04-18 LAB — BASIC METABOLIC PANEL
ANION GAP: 10 (ref 5–15)
Anion gap: 12 (ref 5–15)
BUN: 15 mg/dL (ref 6–20)
BUN: 17 mg/dL (ref 6–20)
CHLORIDE: 118 mmol/L — AB (ref 101–111)
CO2: 18 mmol/L — ABNORMAL LOW (ref 22–32)
CO2: 23 mmol/L (ref 22–32)
CREATININE: 1.01 mg/dL (ref 0.61–1.24)
Calcium: 8.1 mg/dL — ABNORMAL LOW (ref 8.9–10.3)
Calcium: 8.4 mg/dL — ABNORMAL LOW (ref 8.9–10.3)
Chloride: 116 mmol/L — ABNORMAL HIGH (ref 101–111)
Creatinine, Ser: 1.09 mg/dL (ref 0.61–1.24)
GFR calc Af Amer: >60 mL/min (ref >60)
GFR calc Af Amer: >60 mL/min (ref >60)
GFR, EST NON AFRICAN AMERICAN: 59 mL/min — AB (ref >60)
GLUCOSE: 106 mg/dL — AB (ref 65–99)
Glucose, Bld: 171 mg/dL — ABNORMAL HIGH (ref 65–99)
POTASSIUM: 3.7 mmol/L (ref 3.5–5.1)
Potassium: 4.8 mmol/L (ref 3.5–5.1)
SODIUM: 148 mmol/L — AB (ref 135–145)
SODIUM: 149 mmol/L — AB (ref 135–145)

## 2015-04-18 LAB — CULTURE, BLOOD (SINGLE): CULTURE: NO GROWTH

## 2015-04-18 LAB — GLUCOSE, CAPILLARY
GLUCOSE-CAPILLARY: 107 mg/dL — AB (ref 65–99)
GLUCOSE-CAPILLARY: 115 mg/dL — AB (ref 65–99)
Glucose-Capillary: 107 mg/dL — ABNORMAL HIGH (ref 65–99)
Glucose-Capillary: 128 mg/dL — ABNORMAL HIGH (ref 65–99)
Glucose-Capillary: 87 mg/dL (ref 65–99)
Glucose-Capillary: 92 mg/dL (ref 65–99)
Glucose-Capillary: 97 mg/dL (ref 65–99)

## 2015-04-18 LAB — URINE MICROSCOPIC-ADD ON

## 2015-04-18 LAB — URINALYSIS, ROUTINE W REFLEX MICROSCOPIC
BILIRUBIN URINE: NEGATIVE
Glucose, UA: NEGATIVE mg/dL
KETONES UR: 15 mg/dL — AB
NITRITE: NEGATIVE
PROTEIN: 100 mg/dL — AB
Specific Gravity, Urine: 1.025 (ref 1.005–1.030)
pH: 6 (ref 5.0–8.0)

## 2015-04-18 LAB — LACTIC ACID, PLASMA: LACTIC ACID, VENOUS: 2 mmol/L (ref 0.5–2.0)

## 2015-04-18 LAB — MAGNESIUM: MAGNESIUM: 2.3 mg/dL (ref 1.7–2.4)

## 2015-04-18 LAB — APTT: APTT: 25 s (ref 24–37)

## 2015-04-18 LAB — PROTIME-INR
INR: 1.37 (ref 0.00–1.49)
PROTHROMBIN TIME: 17 s — AB (ref 11.6–15.2)

## 2015-04-18 MED ORDER — PIPERACILLIN-TAZOBACTAM 3.375 G IVPB
3.3750 g | Freq: Three times a day (TID) | INTRAVENOUS | Status: DC
  Administered 2015-04-18 – 2015-04-24 (×17): 3.375 g via INTRAVENOUS
  Filled 2015-04-18 (×19): qty 50

## 2015-04-18 MED ORDER — GADOBENATE DIMEGLUMINE 529 MG/ML IV SOLN
10.0000 mL | Freq: Once | INTRAVENOUS | Status: AC | PRN
  Administered 2015-04-18: 10 mL via INTRAVENOUS

## 2015-04-18 MED ORDER — ENOXAPARIN SODIUM 40 MG/0.4ML ~~LOC~~ SOLN
40.0000 mg | SUBCUTANEOUS | Status: DC
  Administered 2015-04-18 – 2015-04-23 (×2): 40 mg via SUBCUTANEOUS
  Filled 2015-04-18 (×4): qty 0.4

## 2015-04-18 NOTE — Progress Notes (Signed)
Paged MD regarding 15 sec run SVT into the 170s. Currently HR 90s and will continue to monitor. Joaquin Bend E, RN 04/18/2015 4:11 AM

## 2015-04-18 NOTE — Progress Notes (Signed)
Spoke with neurology and cardiology regarding arythmias and bleeding gums with tan secretions after suctioning. Ordering labs for morning draw and will stop tube feedings. Will continue to monitor. Ruben Gottron, RN 04/18/2015 10:35 PM

## 2015-04-18 NOTE — Progress Notes (Signed)
Patient was not arousable to sternal rub and vital signs showed increased temp along with irregular rhythm per telemetry tech. Called rapid response to come and take a look and did an EKG and obtained rectal temp of 101.2. Called cardiology on call and he wanted to know results of EKG before ordering anything. Paged cardiology after EKG and he and the rapid response nurse decided to work patient up for sepsis along with some cardiac medications to be given IV bolus. Will continue to monitor. Joaquin Bend E, [place] 04/17/2015 2210

## 2015-04-18 NOTE — Progress Notes (Signed)
Paged cardiology twice regarding arrhythmia and possible aspiration of tube feeding. Will page floor coverage and continue to monitor. Joaquin Bend E, RN 04/18/2015 10:02 PM

## 2015-04-18 NOTE — Progress Notes (Signed)
Subjective:  Patient remains unresponsive. Had episode of nonsustained SVT yesterday spiked temperature to 102.9  Objective:  Vital Signs in the last 24 hours: Temp:  [99.5 F (37.5 C)-102.9 F (39.4 C)] 100 F (37.8 C) (01/07 1100) Pulse Rate:  [70-125] 96 (01/07 1056) Resp:  [17-36] 17 (01/07 1056) BP: (137-194)/(60-91) 164/83 mmHg (01/07 1056) SpO2:  [91 %-99 %] 98 % (01/07 1321) Weight:  [61.1 kg (134 lb 11.2 oz)] 61.1 kg (134 lb 11.2 oz) (01/07 0500)  Intake/Output from previous day: 01/06 0701 - 01/07 0700 In: 80 [NG/GT:80] Out: 975 [Urine:975] Intake/Output from this shift:    Physical Exam: Neck: no adenopathy, no carotid bruit, no JVD and supple, symmetrical, trachea midline Lungs: Bilateral rhonchi noted Heart: regular rate and rhythm, S1, S2 normal and 2/6 systolic murmur noted Abdomen: soft, non-tender; bowel sounds normal; no masses,  no organomegaly Extremities: extremities normal, atraumatic, no cyanosis or edema  Lab Results:  Recent Labs  04/17/15 2337 04/18/15 0330  WBC 7.5 8.7  HGB 12.1* 12.8*  PLT 93* 78*    Recent Labs  04/17/15 2337 04/18/15 0330  NA 149* 148*  K 3.7 4.8  CL 116* 118*  CO2 23 18*  GLUCOSE 171* 106*  BUN 15 17  CREATININE 1.09 1.01   No results for input(s): TROPONINI in the last 72 hours.  Invalid input(s): CK, MB Hepatic Function Panel No results for input(s): PROT, ALBUMIN, AST, ALT, ALKPHOS, BILITOT, BILIDIR, IBILI in the last 72 hours. No results for input(s): CHOL in the last 72 hours. No results for input(s): PROTIME in the last 72 hours.  Imaging: Imaging results have been reviewed and Shane [name] Wo Contrast  04/18/2015  CLINICAL DATA:  Acute encephalopathy and severe hypoglycemia. EXAM: MRI HEAD WITHOUT AND WITH CONTRAST TECHNIQUE: Multiplanar, multiecho pulse sequences of the brain and surrounding structures were obtained without and with intravenous contrast. CONTRAST:  86mL MULTIHANCE GADOBENATE DIMEGLUMINE  529 MG/ML IV SOLN COMPARISON:  MRI of the brain April 12, 2015 FINDINGS: Ventricles and sulci are upper limits of normal for patient's age. No reduced diffusion to suggest acute ischemia. No susceptibility artifact to suggest hemorrhage. Old small bilateral cerebellar infarcts. Old bilateral thalamus and basal ganglia lacunar infarcts. Patchy supratentorial and pontine white matter T2 hyperintensities are unchanged. No midline shift, mass effect, mass lesions. No abnormal parenchymal enhancement. Prominent bifrontal extra-axial spaces are unchanged, likely related to underlying parenchymal brain volume loss. No abnormal extra-axial enhancement or masses. Normal major intracranial vascular flow voids present at the skullbase. Trace paranasal sinus mucosal thickening. Mastoid air cells are well aerated. Status post bilateral ocular lens implants. Mildly expanded fluid-filled empty sella. Generalized bright T1 bone marrow signal compatible with osteopenia. Craniocervical junction intact. IMPRESSION: No acute intracranial process or abnormal enhancement. Chronic changes including old bilateral thalamus and basal ganglia lacunar infarcts. Old bilateral small cerebellar infarcts. Mild to moderate chronic small vessel ischemic disease. Electronically Signed   By: Elon Alas M.D.   On: 04/18/2015 01:42   Dg Chest Port 1 View  04/17/2015  CLINICAL DATA:  Sepsis EXAM: PORTABLE CHEST 1 VIEW COMPARISON:  04/14/2015 FINDINGS: Normal heart size. No pleural effusion identified. There is mild interstitial edema. There is airspace consolidation involving the right lower lobe. IMPRESSION: 1. Right lower lobe pneumonia. 2. Pulmonary edema. Electronically Signed   By: Kerby Moors M.D.   On: 04/17/2015 22:40   Dg Abd Portable 1v  04/16/2015  CLINICAL DATA:  Feeding catheter placement EXAM: PORTABLE ABDOMEN -  1 VIEW COMPARISON:  None. FINDINGS: Scattered large and small bowel gas is noted. Feeding catheter is noted coiled  within the stomach. Degenerative changes of the lumbar spine are seen. No free air is noted. IMPRESSION: Feeding catheter within the stomach. Electronically Signed   By: Inez Catalina M.D.   On: 04/16/2015 16:56    Cardiac Studies:  Assessment/Plan:  Possible aspiration pneumonia Hypoglycemic encephalopathy Status post nonsustained SVT Hypertension Diabetes mellitus History of CVA CAD Severe protein calorie malnutrition Plan Check blood cultures Antibiotics switched to vancomycin and Zosyn empirically Prognosis poor  LOS: 6 days    Charolette Forward 04/18/2015, 1:42 PM

## 2015-04-19 LAB — GLUCOSE, CAPILLARY
GLUCOSE-CAPILLARY: 66 mg/dL (ref 65–99)
GLUCOSE-CAPILLARY: 69 mg/dL (ref 65–99)
GLUCOSE-CAPILLARY: 79 mg/dL (ref 65–99)
Glucose-Capillary: 88 mg/dL (ref 65–99)
Glucose-Capillary: 82 mg/dL (ref 65–99)
Glucose-Capillary: 72 mg/dL (ref 65–99)

## 2015-04-19 LAB — URINE CULTURE: Culture: NO GROWTH

## 2015-04-19 LAB — CREATININE, SERUM
CREATININE: 0.95 mg/dL (ref 0.61–1.24)
GFR calc Af Amer: >60 mL/min (ref >60)
GFR calc non Af Amer: >60 mL/min (ref >60)

## 2015-04-19 MED ORDER — AMIODARONE HCL 200 MG PO TABS
200.0000 mg | ORAL_TABLET | Freq: Two times a day (BID) | ORAL | Status: DC
  Administered 2015-04-19 – 2015-04-22 (×7): 200 mg via ORAL
  Filled 2015-04-19 (×7): qty 1

## 2015-04-19 MED ORDER — METOPROLOL TARTRATE 25 MG PO TABS
25.0000 mg | ORAL_TABLET | Freq: Two times a day (BID) | ORAL | Status: DC
  Administered 2015-04-19 – 2015-04-23 (×10): 25 mg via ORAL
  Filled 2015-04-19 (×10): qty 1

## 2015-04-19 NOTE — Progress Notes (Signed)
Subjective:  Patient remains unresponsive. NG tube feeding was held due to possible aspiration last night  Objective:  Vital Signs in the last 24 hours: Temp:  [97.7 F (36.5 C)-99.6 F (37.6 C)] 99.3 F (37.4 C) (01/08 0917) Pulse Rate:  [78-105] 105 (01/08 0917) Resp:  [16-22] 20 (01/08 0917) BP: (148-164)/(61-89) 155/89 mmHg (01/08 0917) SpO2:  [89 %-100 %] 99 % (01/08 0917) Weight:  [60.12 kg (132 lb 8.7 oz)] 60.12 kg (132 lb 8.7 oz) (01/08 0500)  Intake/Output from previous day: 01/07 0701 - 01/08 0700 In: -  Out: 650 [Urine:650] Intake/Output from this shift:    Physical Exam: Neck: no adenopathy, no carotid bruit, no JVD and supple, symmetrical, trachea midline Lungs: Decreased breath sounds at bases with bilateral rhonchi Heart: regular rate and rhythm, S1, S2 normal and 2/6 systolic murmur noted Abdomen: soft, non-tender; bowel sounds normal; no masses,  no organomegaly Extremities: extremities normal, atraumatic, no cyanosis or edema  Lab Results:  Recent Labs  04/17/15 2337 04/18/15 0330  WBC 7.5 8.7  HGB 12.1* 12.8*  PLT 93* 78*    Recent Labs  04/17/15 2337 04/18/15 0330 04/19/15 0436  NA 149* 148*  --   K 3.7 4.8  --   CL 116* 118*  --   CO2 23 18*  --   GLUCOSE 171* 106*  --   BUN 15 17  --   CREATININE 1.09 1.01 0.95   No results for input(s): TROPONINI in the last 72 hours.  Invalid input(s): CK, MB Hepatic Function Panel No results for input(s): PROT, ALBUMIN, AST, ALT, ALKPHOS, BILITOT, BILIDIR, IBILI in the last 72 hours. No results for input(s): CHOL in the last 72 hours. No results for input(s): PROTIME in the last 72 hours.  Imaging: Imaging results have been reviewed and Mr Jeri Cos Wo Contrast  04/18/2015  CLINICAL DATA:  Acute encephalopathy and severe hypoglycemia. EXAM: MRI HEAD WITHOUT AND WITH CONTRAST TECHNIQUE: Multiplanar, multiecho pulse sequences of the brain and surrounding structures were obtained without and with  intravenous contrast. CONTRAST:  7mL MULTIHANCE GADOBENATE DIMEGLUMINE 529 MG/ML IV SOLN COMPARISON:  MRI of the brain April 12, 2015 FINDINGS: Ventricles and sulci are upper limits of normal for patient's age. No reduced diffusion to suggest acute ischemia. No susceptibility artifact to suggest hemorrhage. Old small bilateral cerebellar infarcts. Old bilateral thalamus and basal ganglia lacunar infarcts. Patchy supratentorial and pontine white matter T2 hyperintensities are unchanged. No midline shift, mass effect, mass lesions. No abnormal parenchymal enhancement. Prominent bifrontal extra-axial spaces are unchanged, likely related to underlying parenchymal brain volume loss. No abnormal extra-axial enhancement or masses. Normal major intracranial vascular flow voids present at the skullbase. Trace paranasal sinus mucosal thickening. Mastoid air cells are well aerated. Status post bilateral ocular lens implants. Mildly expanded fluid-filled empty sella. Generalized bright T1 bone marrow signal compatible with osteopenia. Craniocervical junction intact. IMPRESSION: No acute intracranial process or abnormal enhancement. Chronic changes including old bilateral thalamus and basal ganglia lacunar infarcts. Old bilateral small cerebellar infarcts. Mild to moderate chronic small vessel ischemic disease. Electronically Signed   By: Elon Alas M.D.   On: 04/18/2015 01:42   Dg Chest Port 1 View  04/17/2015  CLINICAL DATA:  Sepsis EXAM: PORTABLE CHEST 1 VIEW COMPARISON:  04/14/2015 FINDINGS: Normal heart size. No pleural effusion identified. There is mild interstitial edema. There is airspace consolidation involving the right lower lobe. IMPRESSION: 1. Right lower lobe pneumonia. 2. Pulmonary edema. Electronically Signed   By: Lovena Le  Clovis Riley M.D.   On: 04/17/2015 22:40    Cardiac Studies:  Assessment/Plan:  Possible aspiration pneumonia Hypoglycemic encephalopathy Status post nonsustained  SVT Hypertension Diabetes mellitus History of CVA CAD Severe protein calorie malnutrition Plan Continue present management Check labs in a.m.  LOS: 7 days    Charolette Forward 04/19/2015, 11:57 AM

## 2015-04-19 NOTE — Progress Notes (Signed)
Pt with copious yellow thick secretions orally suctioned with yankauer from back of throat. Pt also NT suctioned through L nare x2 attempts with copious yellow thick secretions cleared. RT will continue to monitor

## 2015-04-19 NOTE — Procedures (Signed)
Suctioned pt left nostril and obtained an copious amount of thick, yellow sputum.

## 2015-04-20 ENCOUNTER — Inpatient Hospital Stay (HOSPITAL_COMMUNITY): Payer: Medicare Other

## 2015-04-20 LAB — BASIC METABOLIC PANEL
Anion gap: 8 (ref 5–15)
BUN: 17 mg/dL (ref 6–20)
CALCIUM: 8.2 mg/dL — AB (ref 8.9–10.3)
CHLORIDE: 122 mmol/L — AB (ref 101–111)
CO2: 24 mmol/L (ref 22–32)
CREATININE: 1.04 mg/dL (ref 0.61–1.24)
GFR calc non Af Amer: >60 mL/min (ref >60)
GLUCOSE: 79 mg/dL (ref 65–99)
Potassium: 3.5 mmol/L (ref 3.5–5.1)
Sodium: 154 mmol/L — ABNORMAL HIGH (ref 135–145)

## 2015-04-20 LAB — GLUCOSE, CAPILLARY
GLUCOSE-CAPILLARY: 68 mg/dL (ref 65–99)
GLUCOSE-CAPILLARY: 69 mg/dL (ref 65–99)
GLUCOSE-CAPILLARY: 71 mg/dL (ref 65–99)
Glucose-Capillary: 64 mg/dL — ABNORMAL LOW (ref 65–99)

## 2015-04-20 LAB — CBC
HEMATOCRIT: 30.7 % — AB (ref 39.0–52.0)
HEMOGLOBIN: 10.1 g/dL — AB (ref 13.0–17.0)
MCH: 28.2 pg (ref 26.0–34.0)
MCHC: 32.9 g/dL (ref 30.0–36.0)
MCV: 85.8 fL (ref 78.0–100.0)
Platelets: 121 10*3/uL — ABNORMAL LOW (ref 150–400)
RBC: 3.58 MIL/uL — ABNORMAL LOW (ref 4.22–5.81)
RDW: 15.1 % (ref 11.5–15.5)
WBC: 9 10*3/uL (ref 4.0–10.5)

## 2015-04-20 MED ORDER — DEXTROSE 50 % IV SOLN
INTRAVENOUS | Status: AC
  Administered 2015-04-20: 25 mL
  Filled 2015-04-20: qty 50

## 2015-04-20 NOTE — Progress Notes (Signed)
Ref: Birdie Riddle, MD   Subjective:  Remains mostly unresponsive. Opens eyes without tracking and moves lower extremities spontaneously. T max 100 degree F. Discussed care with Son who wants patient's wife to see him before making comfort care.  Objective:  Vital Signs in the last 24 hours: Temp:  [98 F (36.7 C)-100 F (37.8 C)] 98 F (36.7 C) (01/09 0955) Pulse Rate:  [49-84] 84 (01/09 0955) Cardiac Rhythm:  [-] Normal sinus rhythm (01/09 0700) Resp:  [15-20] 15 (01/09 0955) BP: (126-167)/(65-73) 164/65 mmHg (01/09 0955) SpO2:  [95 %-100 %] 96 % (01/09 0955) Weight:  [60.6 kg (133 lb 9.6 oz)] 60.6 kg (133 lb 9.6 oz) (01/09 0500)  Physical Exam: BP Readings from Last 1 Encounters:  04/20/15 164/65    Wt Readings from Last 1 Encounters:  04/20/15 60.6 kg (133 lb 9.6 oz)    Weight change: 0.48 kg (1 lb 0.9 oz)  HEENT: [place]/AT, Eyes-Brown, PERL, Conjunctiva-Pink, Sclera-Non-icteric Neck: No JVD, No bruit, Trachea midline. Lungs:  Clearing, Bilateral. Cardiac:  Regular rhythm, normal S1 and S2, no S3. III/VI systolic murmur. Abdomen:  Soft, non-tender. Extremities:  No edema present. No cyanosis. No clubbing. CNS: AxOx0, moves all 4 extremities frequently and opens eyes more frequently without stimulation . Right handed. Skin: Warm and dry.   Intake/Output from previous day: 01/08 0701 - 01/09 0700 In: -  Out: 300 [Urine:300]    Lab Results: BMET    Component Value Date/Time   NA 154* 04/20/2015 0720   NA 148* 04/18/2015 0330   NA 149* 04/17/2015 2337   NA 143 04/02/2015 1310   NA 143 02/04/2015 0810   NA 144 06/24/2014 1220   NA 141 07/06/2011 0855   K 3.5 04/20/2015 0720   K 4.8 04/18/2015 0330   K 3.7 04/17/2015 2337   K 2.7* 04/02/2015 1310   K 3.3* 02/04/2015 0810   K 3.9 06/24/2014 1220   K 3.6 07/06/2011 0855   CL 122* 04/20/2015 0720   CL 118* 04/18/2015 0330   CL 116* 04/17/2015 2337   CL 105 09/17/2012 0801   CL 107 08/08/2012 0755   CL 105  06/27/2012 1121   CL 104 07/06/2011 0855   CO2 24 04/20/2015 0720   CO2 18* 04/18/2015 0330   CO2 23 04/17/2015 2337   CO2 33* 04/02/2015 1310   CO2 29 02/04/2015 0810   CO2 26 06/24/2014 1220   CO2 28 07/06/2011 0855   GLUCOSE 79 04/20/2015 0720   GLUCOSE 106* 04/18/2015 0330   GLUCOSE 171* 04/17/2015 2337   GLUCOSE 101 04/02/2015 1310   GLUCOSE 130 02/04/2015 0810   GLUCOSE 138 06/24/2014 1220   GLUCOSE 207* 09/17/2012 0801   GLUCOSE 202* 08/08/2012 0755   GLUCOSE 127* 06/27/2012 1121   GLUCOSE 90 07/06/2011 0855   BUN 17 04/20/2015 0720   BUN 17 04/18/2015 0330   BUN 15 04/17/2015 2337   BUN 36.9* 04/02/2015 1310   BUN 22.3 02/04/2015 0810   BUN 17.4 06/24/2014 1220   BUN 18 07/06/2011 0855   CREATININE 1.04 04/20/2015 0720   CREATININE 0.95 04/19/2015 0436   CREATININE 1.01 04/18/2015 0330   CREATININE 1.5* 04/02/2015 1310   CREATININE 1.5* 02/04/2015 0810   CREATININE 1.3 06/24/2014 1220   CREATININE 1.6* 07/06/2011 0855   CALCIUM 8.2* 04/20/2015 0720   CALCIUM 8.1* 04/18/2015 0330   CALCIUM 8.4* 04/17/2015 2337   CALCIUM 9.0 04/02/2015 1310   CALCIUM 9.1 02/04/2015 0810   CALCIUM 9.0 06/24/2014  1220   CALCIUM 8.8 07/06/2011 0855   GFRNONAA >60 04/20/2015 0720   GFRNONAA >60 04/19/2015 0436   GFRNONAA >60 04/18/2015 0330   GFRAA >60 04/20/2015 0720   GFRAA >60 04/19/2015 0436   GFRAA >60 04/18/2015 0330   CBC    Component Value Date/Time   WBC 9.0 04/20/2015 0720   WBC 3.8* 04/02/2015 1310   RBC 3.58* 04/20/2015 0720   RBC 3.84* 04/02/2015 1310   HGB 10.1* 04/20/2015 0720   HGB 11.1* 04/02/2015 1310   HCT 30.7* 04/20/2015 0720   HCT 33.9* 04/02/2015 1310   PLT 121* 04/20/2015 0720   PLT 84* 04/02/2015 1310   MCV 85.8 04/20/2015 0720   MCV 88.3 04/02/2015 1310   MCH 28.2 04/20/2015 0720   MCH 28.9 04/02/2015 1310   MCHC 32.9 04/20/2015 0720   MCHC 32.7 04/02/2015 1310   RDW 15.1 04/20/2015 0720   RDW 14.5 04/02/2015 1310   LYMPHSABS 0.4*  04/12/2015 1157   LYMPHSABS 1.3 04/02/2015 1310   MONOABS 0.4 04/12/2015 1157   MONOABS 0.5 04/02/2015 1310   EOSABS 0.0 04/12/2015 1157   EOSABS 0.1 04/02/2015 1310   BASOSABS 0.0 04/12/2015 1157   BASOSABS 0.0 04/02/2015 1310   HEPATIC Function Panel  Recent Labs  02/04/15 0810 03/10/15 1818 04/02/15 1310  PROT 7.0 6.3* 7.2   HEMOGLOBIN A1C No components found for: HGA1C,  MPG CARDIAC ENZYMES Lab Results  Component Value Date   TROPONINI <0.03 04/12/2015   TROPONINI 0.03 03/11/2015   TROPONINI 0.03 03/10/2015   BNP No results for input(s): PROBNP in the last 8760 hours. TSH  Recent Labs  01/16/15 1611  TSH 1.415   CHOLESTEROL No results for input(s): CHOL in the last 8760 hours.  Scheduled Meds: . albuterol  2.5 mg Nebulization TID  . amiodarone  200 mg Oral BID  . antiseptic oral rinse  7 mL Mouth Rinse q12n4p  . chlorhexidine  15 mL Mouth Rinse BID  . enoxaparin (LOVENOX) injection  40 mg Subcutaneous Q24H  . insulin aspart  0-9 Units Subcutaneous 6 times per day  . metoprolol tartrate  25 mg Oral BID  . piperacillin-tazobactam (ZOSYN)  IV  3.375 g Intravenous 3 times per day  . vancomycin  500 mg Intravenous Q12H   Continuous Infusions: . dextrose 5 % and 0.45 % NaCl with KCl 40 mEq/L 30 mL/hr at 04/20/15 1122  . feeding supplement (JEVITY 1.2 CAL) 1,000 mL (04/17/15 1545)   PRN Meds:.acetaminophen  Assessment/Plan: Possible aspiration pneumonia Hypoglycemic encephalopathy Status post nonsustained SVT Hypertension Diabetes mellitus, II History of CVA CAD Severe protein calorie malnutrition  Plan Continue medical treatment.   LOS: 8 days    [name]  MD  04/20/2015, 11:30 AM

## 2015-04-20 NOTE — Care Management Important Message (Signed)
Important Message  Patient Details  Name: Shane [name] MRN: [id number] Date of Birth: 1926-07-15   Medicare Important Message Given:  Yes    Louanne Belton 04/20/2015, 1:32 PMImportant Message  Patient Details  Name: Shane [name] MRN: [id number] Date of Birth: 01-10-1927   Medicare Important Message Given:  Yes    Esme Durkin G 04/20/2015, 1:32 PM

## 2015-04-20 NOTE — Progress Notes (Signed)
Nutrition Follow-up  DOCUMENTATION CODES:   Severe malnutrition in context of chronic illness, Underweight  INTERVENTION:  Initiate Jevity 1.2 @ 20 ml/hr via Cortrak NGT and increase by 10 ml every 8 hours to goal rate of 70 ml/hr.   Tube feeding regimen provides 2016 kcal (100% of needs), 93 grams of protein, and 1361 ml of H2O.    NUTRITION DIAGNOSIS:   Malnutrition related to chronic illness as evidenced by percent weight loss, severe depletion of muscle mass.  Ongoing  GOAL:   Patient will meet greater than or equal to 90% of their needs  Unmet  MONITOR:   Diet advancement, Weight trends, Labs, I & O's  REASON FOR ASSESSMENT:   Consult Enteral/tube feeding initiation and management  ASSESSMENT:   82 y.o. male with a history of hypertension, hyperlipidemia who presents with altered mental status. He was last seen well 12/31, and was found in his recliner unresponsive the next morning. Blood glucose at the time was 36. His blood glucose was treated, but he continued to be unresponsive and was found to have pneumonia as well as urinary tract infection  Pt awake at time of visit, but unresponsive. Cotrak NGT in place, but TF on hold. Per MD note, NG tube feeding held due to possible aspiration on night of 1/7. Pt has not received tube feedings at goal rate since admission. Per RN, MD wants to talk with pt's son about getting palliative care involved.   Labs: elevated sodium and chloride, low calcium   Diet Order:  Diet NPO time specified  Skin:  Reviewed, no issues  Last BM:  1/6  Height:   Ht Readings from Last 1 Encounters:  04/12/15 6' (1.829 m)    Weight:   Wt Readings from Last 1 Encounters:  04/20/15 133 lb 9.6 oz (60.6 kg)    Ideal Body Weight:  80.9 kg  BMI:  Body mass index is 18.12 kg/(m^2).  Estimated Nutritional Needs:   Kcal:  1850-2050  Protein:  85-95 grams  Fluid:  Per MD  EDUCATION NEEDS:   No education needs identified at this  time  Scarlette Ar RD, LDN Inpatient Clinical Dietitian Pager: [phone] After Hours Pager: [phone]

## 2015-04-20 NOTE — Progress Notes (Signed)
Pt's  CBG read 65, pt is NPO, Dr Doylene Canard paged and notified, said to resume tube feed at 90ml/hr and treat hypoglycemia accordingly,74ml of Dextrose 50% given at 2301 and tube feed of Gevity 1.2 also commenced, will however continue to monitor. Obasogie-Asidi, Delina Kruczek Efe

## 2015-04-21 LAB — GLUCOSE, CAPILLARY
GLUCOSE-CAPILLARY: 109 mg/dL — AB (ref 65–99)
GLUCOSE-CAPILLARY: 97 mg/dL (ref 65–99)
GLUCOSE-CAPILLARY: 88 mg/dL (ref 65–99)
Glucose-Capillary: 132 mg/dL — ABNORMAL HIGH (ref 65–99)
Glucose-Capillary: 83 mg/dL (ref 65–99)
Glucose-Capillary: 87 mg/dL (ref 65–99)
Glucose-Capillary: 90 mg/dL (ref 65–99)

## 2015-04-21 LAB — VANCOMYCIN, TROUGH: Vancomycin Tr: 11 ug/mL (ref 10.0–20.0)

## 2015-04-21 MED ORDER — JEVITY 1.2 CAL PO LIQD
1000.0000 mL | ORAL | Status: DC
  Administered 2015-04-22: 1000 mL
  Filled 2015-04-21 (×2): qty 1000

## 2015-04-21 MED ORDER — KCL IN DEXTROSE-NACL 20-5-0.45 MEQ/L-%-% IV SOLN: INTRAVENOUS | Status: DC

## 2015-04-21 MED ORDER — POTASSIUM CL IN DEXTROSE 5% 20 MEQ/L IV SOLN
20.0000 meq | INTRAVENOUS | Status: DC
  Administered 2015-04-21: 20 meq via INTRAVENOUS
  Filled 2015-04-21: qty 1000

## 2015-04-21 MED ORDER — VANCOMYCIN HCL IN DEXTROSE 750-5 MG/150ML-% IV SOLN
750.0000 mg | Freq: Two times a day (BID) | INTRAVENOUS | Status: DC
  Administered 2015-04-21 – 2015-04-23 (×5): 750 mg via INTRAVENOUS
  Filled 2015-04-21 (×6): qty 150

## 2015-04-21 NOTE — Progress Notes (Signed)
Nasal tracheal suctioned copious amount of thick yellow secretions from patients lungs.

## 2015-04-21 NOTE — Progress Notes (Signed)
Ref: Birdie Riddle, MD   Subjective:  Nursing assistant reported right hand and arm occasional use. Opens eyes without tracking as before and flexes knees and ankles and flexion extension of toes with and without stimulus. Afebrile. Family not visiting due to bad weather and disability.  Objective:  Vital Signs in the last 24 hours: Temp:  [97.9 F (36.6 C)-98.9 F (37.2 C)] 98.4 F (36.9 C) (01/10 1450) Pulse Rate:  [71-103] 71 (01/10 1450) Cardiac Rhythm:  [-] Other (Comment) (01/10 0700) Resp:  [15-20] 20 (01/10 1450) BP: (137-193)/(66-85) 193/70 mmHg (01/10 1450) SpO2:  [96 %-100 %] 100 % (01/10 1450) Weight:  [64.2 kg (141 lb 8.6 oz)] 64.2 kg (141 lb 8.6 oz) (01/10 0500)  Physical Exam: BP Readings from Last 1 Encounters:  04/21/15 193/70    Wt Readings from Last 1 Encounters:  04/21/15 64.2 kg (141 lb 8.6 oz)    Weight change: 3.6 kg (7 lb 15 oz)  HEENT: /AT, Eyes-Brown, PERL, EOMI, Conjunctiva-Pink, Sclera-Non-icteric Neck: No JVD, No bruit, Trachea midline. Lungs:  Clearing post breathing treatment and deep suction. Cardiac:  Regular rhythm, normal S1 and S2, no S3. III/VI systolic murmur Abdomen:  Soft, non-tender. Extremities:  No edema present. No cyanosis. No clubbing. CNS: AxOx0, moves lower extremities frequently and right arm occasonally. Right handed. Skin: Warm and dry.   Intake/Output from previous day: 01/09 0701 - 01/10 0700 In: -  Out: 650 [Urine:650]    Lab Results: BMET    Component Value Date/Time   NA 154* 04/20/2015 0720   NA 148* 04/18/2015 0330   NA 149* 04/17/2015 2337   NA 143 04/02/2015 1310   NA 143 02/04/2015 0810   NA 144 06/24/2014 1220   NA 141 07/06/2011 0855   K 3.5 04/20/2015 0720   K 4.8 04/18/2015 0330   K 3.7 04/17/2015 2337   K 2.7* 04/02/2015 1310   K 3.3* 02/04/2015 0810   K 3.9 06/24/2014 1220   K 3.6 07/06/2011 0855   CL 122* 04/20/2015 0720   CL 118* 04/18/2015 0330   CL 116* 04/17/2015 2337   CL 105  09/17/2012 0801   CL 107 08/08/2012 0755   CL 105 06/27/2012 1121   CL 104 07/06/2011 0855   CO2 24 04/20/2015 0720   CO2 18* 04/18/2015 0330   CO2 23 04/17/2015 2337   CO2 33* 04/02/2015 1310   CO2 29 02/04/2015 0810   CO2 26 06/24/2014 1220   CO2 28 07/06/2011 0855   GLUCOSE 79 04/20/2015 0720   GLUCOSE 106* 04/18/2015 0330   GLUCOSE 171* 04/17/2015 2337   GLUCOSE 101 04/02/2015 1310   GLUCOSE 130 02/04/2015 0810   GLUCOSE 138 06/24/2014 1220   GLUCOSE 207* 09/17/2012 0801   GLUCOSE 202* 08/08/2012 0755   GLUCOSE 127* 06/27/2012 1121   GLUCOSE 90 07/06/2011 0855   BUN 17 04/20/2015 0720   BUN 17 04/18/2015 0330   BUN 15 04/17/2015 2337   BUN 36.9* 04/02/2015 1310   BUN 22.3 02/04/2015 0810   BUN 17.4 06/24/2014 1220   BUN 18 07/06/2011 0855   CREATININE 1.04 04/20/2015 0720   CREATININE 0.95 04/19/2015 0436   CREATININE 1.01 04/18/2015 0330   CREATININE 1.5* 04/02/2015 1310   CREATININE 1.5* 02/04/2015 0810   CREATININE 1.3 06/24/2014 1220   CREATININE 1.6* 07/06/2011 0855   CALCIUM 8.2* 04/20/2015 0720   CALCIUM 8.1* 04/18/2015 0330   CALCIUM 8.4* 04/17/2015 2337   CALCIUM 9.0 04/02/2015 1310   CALCIUM 9.1  02/04/2015 0810   CALCIUM 9.0 06/24/2014 1220   CALCIUM 8.8 07/06/2011 0855   GFRNONAA >60 04/20/2015 0720   GFRNONAA >60 04/19/2015 0436   GFRNONAA >60 04/18/2015 0330   GFRAA >60 04/20/2015 0720   GFRAA >60 04/19/2015 0436   GFRAA >60 04/18/2015 0330   CBC    Component Value Date/Time   WBC 9.0 04/20/2015 0720   WBC 3.8* 04/02/2015 1310   RBC 3.58* 04/20/2015 0720   RBC 3.84* 04/02/2015 1310   HGB 10.1* 04/20/2015 0720   HGB 11.1* 04/02/2015 1310   HCT 30.7* 04/20/2015 0720   HCT 33.9* 04/02/2015 1310   PLT 121* 04/20/2015 0720   PLT 84* 04/02/2015 1310   MCV 85.8 04/20/2015 0720   MCV 88.3 04/02/2015 1310   MCH 28.2 04/20/2015 0720   MCH 28.9 04/02/2015 1310   MCHC 32.9 04/20/2015 0720   MCHC 32.7 04/02/2015 1310   RDW 15.1 04/20/2015 0720    RDW 14.5 04/02/2015 1310   LYMPHSABS 0.4* 04/12/2015 1157   LYMPHSABS 1.3 04/02/2015 1310   MONOABS 0.4 04/12/2015 1157   MONOABS 0.5 04/02/2015 1310   EOSABS 0.0 04/12/2015 1157   EOSABS 0.1 04/02/2015 1310   BASOSABS 0.0 04/12/2015 1157   BASOSABS 0.0 04/02/2015 1310   HEPATIC Function Panel  Recent Labs  02/04/15 0810 03/10/15 1818 04/02/15 1310  PROT 7.0 6.3* 7.2   HEMOGLOBIN A1C No components found for: HGA1C,  MPG CARDIAC ENZYMES Lab Results  Component Value Date   TROPONINI <0.03 04/12/2015   TROPONINI 0.03 03/11/2015   TROPONINI 0.03 03/10/2015   BNP No results for input(s): PROBNP in the last 8760 hours. TSH  Recent Labs  01/16/15 1611  TSH 1.415   CHOLESTEROL No results for input(s): CHOL in the last 8760 hours.  Scheduled Meds: . albuterol  2.5 mg Nebulization TID  . amiodarone  200 mg Oral BID  . antiseptic oral rinse  7 mL Mouth Rinse q12n4p  . chlorhexidine  15 mL Mouth Rinse BID  . enoxaparin (LOVENOX) injection  40 mg Subcutaneous Q24H  . insulin aspart  0-9 Units Subcutaneous 6 times per day  . metoprolol tartrate  25 mg Oral BID  . piperacillin-tazobactam (ZOSYN)  IV  3.375 g Intravenous 3 times per day  . vancomycin  500 mg Intravenous Q12H   Continuous Infusions: . feeding supplement (JEVITY 1.2 CAL) 1,000 mL (04/20/15 2240)   PRN Meds:.acetaminophen  Assessment/Plan: Possible aspiration pneumonia Hypoglycemic encephalopathy Status post nonsustained SVT Hypertension Diabetes mellitus, II History of CVA CAD Severe protein calorie malnutrition  Increase feeding to 30 cc/hr.     LOS: 9 days    [name]  MD  04/21/2015, 3:23 PM

## 2015-04-21 NOTE — Progress Notes (Addendum)
[place] for Zosyn and Vancomycin Indication: sepsis  Allergies  Allergen Reactions  . Lisinopril Cough    Patient Measurements: Height: 6' (182.9 cm) Weight: 141 lb 8.6 oz (64.2 kg) IBW/kg (Calculated) : 77.6  Vital Signs: Temp: 98.2 F (36.8 C) (01/10 0500) Temp Source: Axillary (01/10 0500) BP: 156/79 mmHg (01/10 0500) Pulse Rate: 82 (01/10 0500) Intake/Output from previous day: 01/09 0701 - 01/10 0700 In: -  Out: 650 [Urine:650] Intake/Output from this shift:    Labs:  Recent Labs  04/19/15 0436 04/20/15 0720  WBC  --  9.0  HGB  --  10.1*  PLT  --  121*  CREATININE 0.95 1.04   Estimated Creatinine Clearance: 46.3 mL/min (by C-G formula based on Cr of 1.04). No results for input(s): VANCOTROUGH, VANCOPEAK, VANCORANDOM, GENTTROUGH, GENTPEAK, GENTRANDOM, TOBRATROUGH, TOBRAPEAK, TOBRARND, AMIKACINPEAK, AMIKACINTROU, AMIKACIN in the last 72 hours.   Microbiology: Recent Results (from the past 720 hour(s))  MRSA PCR Screening     Status: None   Collection Time: 04/12/15  4:00 PM  Result Value Ref Range Status   MRSA by PCR NEGATIVE NEGATIVE Final    Comment:        The GeneXpert MRSA Assay (FDA approved for NASAL specimens only), is one component of a comprehensive MRSA colonization surveillance program. It is not intended to diagnose MRSA infection nor to guide or monitor treatment for MRSA infections.   CSF culture     Status: None   Collection Time: 04/13/15 12:58 AM  Result Value Ref Range Status   Specimen Description CSF  Final   Special Requests NONE  Final   Gram Stain   Final    CYTOSPIN SMEAR WBC PRESENT, PREDOMINANTLY PMN NO ORGANISMS SEEN    Culture NO GROWTH 3 DAYS  Final   Report Status 04/17/2015 FINAL  Final  Culture, blood (single)     Status: None   Collection Time: 04/13/15 10:40 PM  Result Value Ref Range Status   Specimen Description BLOOD RIGHT HAND  Final   Special Requests BOTTLES DRAWN  AEROBIC ONLY 5CC  Final   Culture NO GROWTH 5 DAYS  Final   Report Status 04/18/2015 FINAL  Final  Culture, blood (routine x 2)     Status: None (Preliminary result)   Collection Time: 04/17/15 11:25 PM  Result Value Ref Range Status   Specimen Description BLOOD LEFT ARM  Final   Special Requests AEROBIC BOTTLE ONLY 5ML  Final   Culture NO GROWTH 2 DAYS  Final   Report Status PENDING  Incomplete  Culture, blood (routine x 2)     Status: None (Preliminary result)   Collection Time: 04/17/15 11:35 PM  Result Value Ref Range Status   Specimen Description BLOOD LEFT HAND  Final   Special Requests IN PEDIATRIC BOTTLE 3ML  Final   Culture NO GROWTH 2 DAYS  Final   Report Status PENDING  Incomplete  Culture, Urine     Status: None   Collection Time: 04/18/15 12:04 AM  Result Value Ref Range Status   Specimen Description URINE, RANDOM  Final   Special Requests NONE  Final   Culture NO GROWTH 1 DAY  Final   Report Status 04/19/2015 FINAL  Final    Assessment: 29 YOM who presented to [facility] with altered mental status. He was treated for pneumonia and UTI but continued to be unresponsive. He continues on D#4 of vancomycin and zosyn. Pt is afebrile, WBC is WNL, cultures (including  MRSA PCR) are negative to date.   1/1 CTX>>1/6 1/7 Zosyn>> 1/7 Vanc>>  1/2 BCx>>NGF 1/7 Blood - NEG 1/2 CSF culture>>NGF 1/1 MRSA - NEG  Goal of Therapy:  Vancomycin trough level 15-20 mcg/ml  Plan:  - Continue vanc 500mg  IV Q12H - MD: Consider DC - Continue zosyn 3.375gm IV Q8H (4 hr inf) - F/u renal fxn, C&S, clinical status and trough at [facility], PharmD, BCPS Pager # [phone] 04/21/2015 10:06 AM  ADDENDUM:  Vancomycin trough = 11 mcg/ml on Vancomycin 500 mg IV q12h.  Vancomycin doses charted given as ordered since started on 04/18/15.  Trough is below target level of 15-69mcg/ml for PNA adn UTI.  Currently afebrile.  PLAN: Increase Vancomycin to 750 mg IV q12h - F/u renal fxn, C&S,  clinical status and trough at [place], [place] Pharmacist Pager: [phone] 04/21/2015 10:18 PM

## 2015-04-21 NOTE — Progress Notes (Signed)
Patients tube feeding resumed last night asked respiratory to perform breathing treatment this afternoon around 1700 also to do a PRN deep suctioning, patient still has congested cough that is around bronchial area. Deep suctioning did not clear the congestion, patient still has a nonproductive cough, increased feeding to 30 ml/hr from 20 20 ml/hr. Will continue to monitor.

## 2015-04-21 NOTE — Care Management Note (Signed)
Case Management Note  Patient Details  Name: Shane [name] MRN: [id number] Date of Birth: 12/10/1926  Subjective/Objective:                    Action/Plan: Patient continues with minimal responsiveness. On IV antibiotics and tube feedings. CM continuing to follow for discharge needs.   Expected Discharge Date:                  Expected Discharge Plan:     In-House Referral:     Discharge planning Services     Post Acute Care Choice:    Choice offered to:     DME Arranged:    DME Agency:     HH Arranged:    HH Agency:     Status of Service:  In process, will continue to follow  Medicare Important Message Given:  Yes Date Medicare IM Given:    Medicare IM give by:    Date Additional Medicare IM Given:    Additional Medicare Important Message give by:     If discussed at [place] of Stay Meetings, dates discussed:    Additional Comments:  Pollie Friar, RN 04/21/2015, 11:54 AM

## 2015-04-22 LAB — BASIC METABOLIC PANEL
ANION GAP: 8 (ref 5–15)
BUN: 13 mg/dL (ref 6–20)
CHLORIDE: 122 mmol/L — AB (ref 101–111)
CO2: 25 mmol/L (ref 22–32)
Calcium: 8.1 mg/dL — ABNORMAL LOW (ref 8.9–10.3)
Creatinine, Ser: 0.97 mg/dL (ref 0.61–1.24)
GFR calc non Af Amer: >60 mL/min (ref >60)
Glucose, Bld: 132 mg/dL — ABNORMAL HIGH (ref 65–99)
POTASSIUM: 3.1 mmol/L — AB (ref 3.5–5.1)
Sodium: 155 mmol/L — ABNORMAL HIGH (ref 135–145)

## 2015-04-22 LAB — GLUCOSE, CAPILLARY
GLUCOSE-CAPILLARY: 96 mg/dL (ref 65–99)
GLUCOSE-CAPILLARY: 105 mg/dL — AB (ref 65–99)
Glucose-Capillary: 117 mg/dL — ABNORMAL HIGH (ref 65–99)
Glucose-Capillary: 125 mg/dL — ABNORMAL HIGH (ref 65–99)
Glucose-Capillary: 114 mg/dL — ABNORMAL HIGH (ref 65–99)

## 2015-04-22 MED ORDER — POTASSIUM CL IN DEXTROSE 5% 20 MEQ/L IV SOLN
20.0000 meq | INTRAVENOUS | Status: DC
Start: 2015-04-22 — End: 2015-04-23
  Filled 2015-04-22: qty 1000

## 2015-04-22 MED ORDER — JEVITY 1.2 CAL PO LIQD
1000.0000 mL | ORAL | Status: DC
  Administered 2015-04-22: 1000 mL
  Filled 2015-04-22 (×3): qty 1000

## 2015-04-22 MED ORDER — POTASSIUM CHLORIDE 20 MEQ/15ML (10%) PO SOLN
10.0000 meq | Freq: Three times a day (TID) | ORAL | Status: DC
  Administered 2015-04-22 (×3): 10 meq
  Filled 2015-04-22 (×3): qty 15

## 2015-04-22 MED ORDER — ALBUTEROL SULFATE (2.5 MG/3ML) 0.083% IN NEBU
2.5000 mg | INHALATION_SOLUTION | Freq: Two times a day (BID) | RESPIRATORY_TRACT | Status: DC
  Administered 2015-04-22 – 2015-04-24 (×5): 2.5 mg via RESPIRATORY_TRACT
  Filled 2015-04-22 (×5): qty 3

## 2015-04-22 MED ORDER — FREE WATER
50.0000 mL | Status: DC
  Administered 2015-04-22 – 2015-04-23 (×5): 50 mL

## 2015-04-22 NOTE — Progress Notes (Signed)
Ref: [name], MD   Subjective:  Opens eyes without tracking with little stilulus like taping shoulder. T max 99 degree F.   Objective:  Vital Signs in the last 24 hours: Temp:  [97.9 F (36.6 C)-99 F (37.2 C)] 98.4 F (36.9 C) (01/11 0544) Pulse Rate:  [66-103] 77 (01/11 0544) Cardiac Rhythm:  [-] Other (Comment) (01/11 0700) Resp:  [15-21] 15 (01/11 0544) BP: (126-193)/(51-72) 142/52 mmHg (01/11 0544) SpO2:  [97 %-100 %] 100 % (01/11 0754)  Physical Exam: BP Readings from Last 1 Encounters:  04/22/15 142/52    Wt Readings from Last 1 Encounters:  04/21/15 64.2 kg (141 lb 8.6 oz)    Weight change:   HEENT: [place]/AT, Eyes-Brown, PERL, Conjunctiva-Pink, Sclera-Non-icteric Neck: No JVD, No bruit, Trachea midline. Lungs:  Clearing, Bilateral. Cardiac:  Regular rhythm, normal S1 and S2, no S3. III/VI systolic murmur as before. Abdomen:  Soft, non-tender. Extremities:  No edema present. No cyanosis. No clubbing. CNS: AxOx0, moves lower extremities. Right handed. Skin: Warm and dry.   Intake/Output from previous day: 01/10 0701 - 01/11 0700 In: -  Out: 1000 [Urine:1000]    Lab Results: BMET    Component Value Date/Time   NA 155* 04/22/2015 0630   NA 154* 04/20/2015 0720   NA 148* 04/18/2015 0330   NA 143 04/02/2015 1310   NA 143 02/04/2015 0810   NA 144 06/24/2014 1220   NA 141 07/06/2011 0855   K 3.1* 04/22/2015 0630   K 3.5 04/20/2015 0720   K 4.8 04/18/2015 0330   K 2.7* 04/02/2015 1310   K 3.3* 02/04/2015 0810   K 3.9 06/24/2014 1220   K 3.6 07/06/2011 0855   CL 122* 04/22/2015 0630   CL 122* 04/20/2015 0720   CL 118* 04/18/2015 0330   CL 105 09/17/2012 0801   CL 107 08/08/2012 0755   CL 105 06/27/2012 1121   CL 104 07/06/2011 0855   CO2 25 04/22/2015 0630   CO2 24 04/20/2015 0720   CO2 18* 04/18/2015 0330   CO2 33* 04/02/2015 1310   CO2 29 02/04/2015 0810   CO2 26 06/24/2014 1220   CO2 28 07/06/2011 0855   GLUCOSE 132* 04/22/2015 0630    GLUCOSE 79 04/20/2015 0720   GLUCOSE 106* 04/18/2015 0330   GLUCOSE 101 04/02/2015 1310   GLUCOSE 130 02/04/2015 0810   GLUCOSE 138 06/24/2014 1220   GLUCOSE 207* 09/17/2012 0801   GLUCOSE 202* 08/08/2012 0755   GLUCOSE 127* 06/27/2012 1121   GLUCOSE 90 07/06/2011 0855   BUN 13 04/22/2015 0630   BUN 17 04/20/2015 0720   BUN 17 04/18/2015 0330   BUN 36.9* 04/02/2015 1310   BUN 22.3 02/04/2015 0810   BUN 17.4 06/24/2014 1220   BUN 18 07/06/2011 0855   CREATININE 0.97 04/22/2015 0630   CREATININE 1.04 04/20/2015 0720   CREATININE 0.95 04/19/2015 0436   CREATININE 1.5* 04/02/2015 1310   CREATININE 1.5* 02/04/2015 0810   CREATININE 1.3 06/24/2014 1220   CREATININE 1.6* 07/06/2011 0855   CALCIUM 8.1* 04/22/2015 0630   CALCIUM 8.2* 04/20/2015 0720   CALCIUM 8.1* 04/18/2015 0330   CALCIUM 9.0 04/02/2015 1310   CALCIUM 9.1 02/04/2015 0810   CALCIUM 9.0 06/24/2014 1220   CALCIUM 8.8 07/06/2011 0855   GFRNONAA >60 04/22/2015 0630   GFRNONAA >60 04/20/2015 0720   GFRNONAA >60 04/19/2015 0436   GFRAA >60 04/22/2015 0630   GFRAA >60 04/20/2015 0720   GFRAA >60 04/19/2015 0436   CBC  Component Value Date/Time   WBC 9.0 04/20/2015 0720   WBC 3.8* 04/02/2015 1310   RBC 3.58* 04/20/2015 0720   RBC 3.84* 04/02/2015 1310   HGB 10.1* 04/20/2015 0720   HGB 11.1* 04/02/2015 1310   HCT 30.7* 04/20/2015 0720   HCT 33.9* 04/02/2015 1310   PLT 121* 04/20/2015 0720   PLT 84* 04/02/2015 1310   MCV 85.8 04/20/2015 0720   MCV 88.3 04/02/2015 1310   MCH 28.2 04/20/2015 0720   MCH 28.9 04/02/2015 1310   MCHC 32.9 04/20/2015 0720   MCHC 32.7 04/02/2015 1310   RDW 15.1 04/20/2015 0720   RDW 14.5 04/02/2015 1310   LYMPHSABS 0.4* 04/12/2015 1157   LYMPHSABS 1.3 04/02/2015 1310   MONOABS 0.4 04/12/2015 1157   MONOABS 0.5 04/02/2015 1310   EOSABS 0.0 04/12/2015 1157   EOSABS 0.1 04/02/2015 1310   BASOSABS 0.0 04/12/2015 1157   BASOSABS 0.0 04/02/2015 1310   HEPATIC Function  Panel  Recent Labs  02/04/15 0810 03/10/15 1818 04/02/15 1310  PROT 7.0 6.3* 7.2   HEMOGLOBIN A1C No components found for: HGA1C,  MPG CARDIAC ENZYMES Lab Results  Component Value Date   TROPONINI <0.03 04/12/2015   TROPONINI 0.03 03/11/2015   TROPONINI 0.03 03/10/2015   BNP No results for input(s): PROBNP in the last 8760 hours. TSH  Recent Labs  01/16/15 1611  TSH 1.415   CHOLESTEROL No results for input(s): CHOL in the last 8760 hours.  Scheduled Meds: . albuterol  2.5 mg Nebulization TID  . amiodarone  200 mg Oral BID  . antiseptic oral rinse  7 mL Mouth Rinse q12n4p  . chlorhexidine  15 mL Mouth Rinse BID  . enoxaparin (LOVENOX) injection  40 mg Subcutaneous Q24H  . free water  50 mL Per Tube Q4H while awake  . insulin aspart  0-9 Units Subcutaneous 6 times per day  . metoprolol tartrate  25 mg Oral BID  . piperacillin-tazobactam (ZOSYN)  IV  3.375 g Intravenous 3 times per day  . potassium chloride  10 mEq Per Tube TID  . vancomycin  750 mg Intravenous Q12H   Continuous Infusions: . dextrose 5 % with KCl 20 mEq / L    . feeding supplement (JEVITY 1.2 CAL)     PRN Meds:.acetaminophen  Assessment/Plan: Aspiration pneumonia Hypoglycemic encephalopathy Status post nonsustained SVT Hypertension Diabetes mellitus, II History of CVA CAD Severe protein calorie malnutrition Hypokalemia Hypernatremia   Awaiting visit by wife and son to proceed with comfort care or SNF with IV antibiotics. May need G tube. Has partial improvement but he remains aphasic with significant weakness in extremities. Overall prognosis remains poor. Add potassium supplement and free water.   LOS: 10 days    Shane Dials  MD  04/22/2015, 9:22 AM

## 2015-04-23 LAB — BASIC METABOLIC PANEL
Anion gap: 5 (ref 5–15)
BUN: 12 mg/dL (ref 6–20)
CALCIUM: 8.3 mg/dL — AB (ref 8.9–10.3)
CO2: 26 mmol/L (ref 22–32)
CREATININE: 0.9 mg/dL (ref 0.61–1.24)
Chloride: 123 mmol/L — ABNORMAL HIGH (ref 101–111)
GFR calc non Af Amer: >60 mL/min (ref >60)
Glucose, Bld: 102 mg/dL — ABNORMAL HIGH (ref 65–99)
Potassium: 3.2 mmol/L — ABNORMAL LOW (ref 3.5–5.1)
SODIUM: 154 mmol/L — AB (ref 135–145)

## 2015-04-23 LAB — GLUCOSE, CAPILLARY
GLUCOSE-CAPILLARY: 96 mg/dL (ref 65–99)
GLUCOSE-CAPILLARY: 107 mg/dL — AB (ref 65–99)
GLUCOSE-CAPILLARY: 101 mg/dL — AB (ref 65–99)
Glucose-Capillary: 106 mg/dL — ABNORMAL HIGH (ref 65–99)
Glucose-Capillary: 122 mg/dL — ABNORMAL HIGH (ref 65–99)
Glucose-Capillary: 86 mg/dL (ref 65–99)
Glucose-Capillary: 88 mg/dL (ref 65–99)

## 2015-04-23 LAB — CULTURE, BLOOD (ROUTINE X 2)
CULTURE: NO GROWTH
Culture: NO GROWTH

## 2015-04-23 MED ORDER — AMIODARONE HCL 200 MG PO TABS
200.0000 mg | ORAL_TABLET | Freq: Every day | ORAL | Status: DC
  Administered 2015-04-23 – 2015-04-24 (×2): 200 mg via ORAL
  Filled 2015-04-23 (×2): qty 1

## 2015-04-23 MED ORDER — POTASSIUM CL IN DEXTROSE 5% 20 MEQ/L IV SOLN
20.0000 meq | INTRAVENOUS | Status: DC
  Administered 2015-04-23: 20 meq via INTRAVENOUS
  Filled 2015-04-23: qty 1000

## 2015-04-23 MED ORDER — FREE WATER
50.0000 mL | Status: DC
  Administered 2015-04-23 – 2015-04-24 (×5): 50 mL

## 2015-04-23 MED ORDER — POTASSIUM PHOSPHATE MONOBASIC 500 MG PO TABS
500.0000 mg | ORAL_TABLET | Freq: Four times a day (QID) | ORAL | Status: DC
  Administered 2015-04-23 (×2): 500 mg
  Filled 2015-04-23 (×4): qty 1

## 2015-04-23 MED ORDER — JEVITY 1.2 CAL PO LIQD
1000.0000 mL | ORAL | Status: DC
  Administered 2015-04-23: 1000 mL
  Filled 2015-04-23 (×3): qty 1000

## 2015-04-23 MED ORDER — K PHOS MONO-SOD PHOS DI & MONO 155-852-130 MG PO TABS
500.0000 mg | ORAL_TABLET | Freq: Four times a day (QID) | ORAL | Status: DC
  Administered 2015-04-23 – 2015-04-25 (×9): 500 mg via ORAL
  Filled 2015-04-23 (×14): qty 2

## 2015-04-23 NOTE — Progress Notes (Signed)
Nutrition Follow-up  DOCUMENTATION CODES:   Severe malnutrition in context of chronic illness, Underweight  INTERVENTION:  Recommend advancing tube feeding rate. Advance Jevity 1.2 via Cortrak NGT to 50 ml/hr and increase by 10 ml every 8 hours to goal rate of 70 ml/hr.   Tube feeding regimen provides 2016 kcal (100% of needs), 93 grams of protein, and 1361 ml of H2O.    NUTRITION DIAGNOSIS:   Malnutrition related to chronic illness as evidenced by percent weight loss, severe depletion of muscle mass.  Ongoing  GOAL:   Patient will meet greater than or equal to 90% of their needs  Unmet  MONITOR:   Diet advancement, Weight trends, Labs, I & O's  REASON FOR ASSESSMENT:   Consult Enteral/tube feeding initiation and management  ASSESSMENT:   82 y.o. male with a history of hypertension, hyperlipidemia who presents with altered mental status. He was last seen well 12/31, and was found in his recliner unresponsive the next morning. Blood glucose at the time was 36. His blood glucose was treated, but he continued to be unresponsive and was found to have pneumonia as well as urinary tract infection  Pt's tube feedings were held due to possible aspiration on 1/7. Tube feedings were resumed around 2300 hr on 1/9 at 20 ml/hr. Tube feedings were slowly advanced and pt is now receiving Jevity 1.2 @ 40 ml/hr; this provides 1152 kcal (62% of estimated needs) and 53 grams of protein (62% of estimated needs). Per chart, pt continues to have copious amounts of thick secretions.  Per MD note, "awaiting visit by wife and son to proceed with comfort care or SNF".   Labs: high sodium, low potassium, high chloride, low calcium  Diet Order:  Diet NPO time specified  Skin:  Reviewed, no issues  Last BM:  1/12 Colostomy  Height:   Ht Readings from Last 1 Encounters:  04/12/15 6' (1.829 m)    Weight:   Wt Readings from Last 1 Encounters:  04/23/15 144 lb 6.4 oz (65.5 kg)    Ideal  Body Weight:  80.9 kg  BMI:  Body mass index is 19.58 kg/(m^2).  Estimated Nutritional Needs:   Kcal:  1850-2050  Protein:  85-95 grams  Fluid:  Per MD  EDUCATION NEEDS:   No education needs identified at this time  Scarlette Ar RD, LDN Inpatient Clinical Dietitian Pager: [phone] After Hours Pager: [phone]

## 2015-04-23 NOTE — Care Management Important Message (Signed)
Important Message  Patient Details  Name: Shane [name] MRN: [id number] Date of Birth: 15-Feb-1927   Medicare Important Message Given:  Yes    Nikka Hakimian P Samoria Fedorko 04/23/2015, 1:33 PM

## 2015-04-23 NOTE — Progress Notes (Signed)
Ref: Bernice Mcauliffe S, MD   Subjective:  Opens eyes without stimulus but not tracking. Afebrile.   Objective:  Vital Signs in the last 24 hours: Temp:  [97.5 F (36.4 C)-98.7 F (37.1 C)] 98.4 F (36.9 C) (01/12 1706) Pulse Rate:  [58-101] 68 (01/12 1706) Cardiac Rhythm:  [-] Other (Comment) (01/12 1900) Resp:  [15-16] 16 (01/12 1706) BP: (127-181)/(43-72) 149/72 mmHg (01/12 1706) SpO2:  [99 %-100 %] 100 % (01/12 2044) Weight:  [65.5 kg (144 lb 6.4 oz)] 65.5 kg (144 lb 6.4 oz) (01/12 0500)  Physical Exam: BP Readings from Last 1 Encounters:  04/23/15 149/72    Wt Readings from Last 1 Encounters:  04/23/15 65.5 kg (144 lb 6.4 oz)    Weight change:   HEENT: [place]/AT, Eyes-Brown, PERL, Conjunctiva-Pink, Sclera-Non-icteric Neck: No JVD, No bruit, Trachea midline. Lungs:  Clearing, Bilateral. Cardiac:  Regular rhythm, normal S1 and S2, no S3. III/VI systolic murmur. Abdomen:  Soft, non-tender. Extremities:  No edema present. No cyanosis. No clubbing. CNS: AxOx0, moves lower extremities mostly toes and feet randomly. Flexion of both elbows.  Skin: Warm and dry.   Intake/Output from previous day: 01/11 0701 - 01/12 0700 In: -  Out: 575 [Urine:375; Stool:200]    Lab Results: BMET    Component Value Date/Time   NA 154* 04/23/2015 0626   NA 155* 04/22/2015 0630   NA 154* 04/20/2015 0720   NA 143 04/02/2015 1310   NA 143 02/04/2015 0810   NA 144 06/24/2014 1220   NA 141 07/06/2011 0855   K 3.2* 04/23/2015 0626   K 3.1* 04/22/2015 0630   K 3.5 04/20/2015 0720   K 2.7* 04/02/2015 1310   K 3.3* 02/04/2015 0810   K 3.9 06/24/2014 1220   K 3.6 07/06/2011 0855   CL 123* 04/23/2015 0626   CL 122* 04/22/2015 0630   CL 122* 04/20/2015 0720   CL 105 09/17/2012 0801   CL 107 08/08/2012 0755   CL 105 06/27/2012 1121   CL 104 07/06/2011 0855   CO2 26 04/23/2015 0626   CO2 25 04/22/2015 0630   CO2 24 04/20/2015 0720   CO2 33* 04/02/2015 1310   CO2 29 02/04/2015 0810   CO2 26  06/24/2014 1220   CO2 28 07/06/2011 0855   GLUCOSE 102* 04/23/2015 0626   GLUCOSE 132* 04/22/2015 0630   GLUCOSE 79 04/20/2015 0720   GLUCOSE 101 04/02/2015 1310   GLUCOSE 130 02/04/2015 0810   GLUCOSE 138 06/24/2014 1220   GLUCOSE 207* 09/17/2012 0801   GLUCOSE 202* 08/08/2012 0755   GLUCOSE 127* 06/27/2012 1121   GLUCOSE 90 07/06/2011 0855   BUN 12 04/23/2015 0626   BUN 13 04/22/2015 0630   BUN 17 04/20/2015 0720   BUN 36.9* 04/02/2015 1310   BUN 22.3 02/04/2015 0810   BUN 17.4 06/24/2014 1220   BUN 18 07/06/2011 0855   CREATININE 0.90 04/23/2015 0626   CREATININE 0.97 04/22/2015 0630   CREATININE 1.04 04/20/2015 0720   CREATININE 1.5* 04/02/2015 1310   CREATININE 1.5* 02/04/2015 0810   CREATININE 1.3 06/24/2014 1220   CREATININE 1.6* 07/06/2011 0855   CALCIUM 8.3* 04/23/2015 0626   CALCIUM 8.1* 04/22/2015 0630   CALCIUM 8.2* 04/20/2015 0720   CALCIUM 9.0 04/02/2015 1310   CALCIUM 9.1 02/04/2015 0810   CALCIUM 9.0 06/24/2014 1220   CALCIUM 8.8 07/06/2011 0855   GFRNONAA >60 04/23/2015 0626   GFRNONAA >60 04/22/2015 0630   GFRNONAA >60 04/20/2015 0720   GFRAA >60 04/23/2015  0626   GFRAA >60 04/22/2015 0630   GFRAA >60 04/20/2015 0720   CBC    Component Value Date/Time   WBC 9.0 04/20/2015 0720   WBC 3.8* 04/02/2015 1310   RBC 3.58* 04/20/2015 0720   RBC 3.84* 04/02/2015 1310   HGB 10.1* 04/20/2015 0720   HGB 11.1* 04/02/2015 1310   HCT 30.7* 04/20/2015 0720   HCT 33.9* 04/02/2015 1310   PLT 121* 04/20/2015 0720   PLT 84* 04/02/2015 1310   MCV 85.8 04/20/2015 0720   MCV 88.3 04/02/2015 1310   MCH 28.2 04/20/2015 0720   MCH 28.9 04/02/2015 1310   MCHC 32.9 04/20/2015 0720   MCHC 32.7 04/02/2015 1310   RDW 15.1 04/20/2015 0720   RDW 14.5 04/02/2015 1310   LYMPHSABS 0.4* 04/12/2015 1157   LYMPHSABS 1.3 04/02/2015 1310   MONOABS 0.4 04/12/2015 1157   MONOABS 0.5 04/02/2015 1310   EOSABS 0.0 04/12/2015 1157   EOSABS 0.1 04/02/2015 1310   BASOSABS 0.0  04/12/2015 1157   BASOSABS 0.0 04/02/2015 1310   HEPATIC Function Panel  Recent Labs  02/04/15 0810 03/10/15 1818 04/02/15 1310  PROT 7.0 6.3* 7.2   HEMOGLOBIN A1C No components found for: HGA1C,  MPG CARDIAC ENZYMES Lab Results  Component Value Date   TROPONINI <0.03 04/12/2015   TROPONINI 0.03 03/11/2015   TROPONINI 0.03 03/10/2015   BNP No results for input(s): PROBNP in the last 8760 hours. TSH  Recent Labs  01/16/15 1611  TSH 1.415   CHOLESTEROL No results for input(s): CHOL in the last 8760 hours.  Scheduled Meds: . albuterol  2.5 mg Nebulization BID  . amiodarone  200 mg Oral Daily  . antiseptic oral rinse  7 mL Mouth Rinse q12n4p  . chlorhexidine  15 mL Mouth Rinse BID  . enoxaparin (LOVENOX) injection  40 mg Subcutaneous Q24H  . feeding supplement (JEVITY 1.2 CAL)  1,000 mL Per Tube Q24H  . free water  50 mL Per Tube Q3H while awake  . insulin aspart  0-9 Units Subcutaneous 6 times per day  . metoprolol tartrate  25 mg Oral BID  . phosphorus  500 mg Oral QID  . piperacillin-tazobactam (ZOSYN)  IV  3.375 g Intravenous 3 times per day  . vancomycin  750 mg Intravenous Q12H   Continuous Infusions: . dextrose 5 % with KCl 20 mEq / L 20 mEq (04/23/15 1000)   PRN Meds:.acetaminophen  Assessment/Plan: Aspiration pneumonia Hypoglycemic encephalopathy Status post nonsustained SVT Hypertension Diabetes mellitus, II History of CVA CAD Severe protein calorie malnutrition Hypokalemia Hypernatremia  Increase free water and change potassium salt to phosphate to decrease chloride load. Consider oral antibiotics from tomorrow.   LOS: 11 days    [name]  MD  04/23/2015, 9:02 PM

## 2015-04-23 NOTE — Procedures (Signed)
NTS performed and copious amount of yellow thick secretions obtained from pt's left nostril.

## 2015-04-24 LAB — GLUCOSE, CAPILLARY
GLUCOSE-CAPILLARY: 138 mg/dL — AB (ref 65–99)
GLUCOSE-CAPILLARY: 82 mg/dL (ref 65–99)
GLUCOSE-CAPILLARY: 71 mg/dL (ref 65–99)
GLUCOSE-CAPILLARY: 79 mg/dL (ref 65–99)
Glucose-Capillary: 105 mg/dL — ABNORMAL HIGH (ref 65–99)
Glucose-Capillary: 74 mg/dL (ref 65–99)
Glucose-Capillary: 86 mg/dL (ref 65–99)
Glucose-Capillary: 94 mg/dL (ref 65–99)

## 2015-04-24 LAB — PHOSPHORUS: PHOSPHORUS: 3.3 mg/dL (ref 2.5–4.6)

## 2015-04-24 LAB — BRAIN NATRIURETIC PEPTIDE: B Natriuretic Peptide: 287.7 pg/mL — ABNORMAL HIGH (ref 0.0–100.0)

## 2015-04-24 MED ORDER — JEVITY 1.2 CAL PO LIQD
1000.0000 mL | ORAL | Status: DC
  Filled 2015-04-24 (×2): qty 1000

## 2015-04-24 MED ORDER — FREE WATER
60.0000 mL | Status: DC
  Administered 2015-04-24 – 2015-04-26 (×14): 60 mL

## 2015-04-24 MED ORDER — LEVOFLOXACIN 25 MG/ML PO SOLN
500.0000 mg | Freq: Every day | ORAL | Status: DC
  Administered 2015-04-24: 500 mg via ORAL
  Filled 2015-04-24: qty 20

## 2015-04-24 MED ORDER — MORPHINE SULFATE 25 MG/ML IV SOLN
5.0000 mg/h | INTRAVENOUS | Status: AC
Start: 2015-04-24 — End: 2015-04-24
  Administered 2015-04-24: 5 mg/h via INTRAVENOUS
  Filled 2015-04-24 (×2): qty 10

## 2015-04-24 MED ORDER — METOPROLOL TARTRATE 12.5 MG HALF TABLET
12.5000 mg | ORAL_TABLET | Freq: Two times a day (BID) | ORAL | Status: DC
  Administered 2015-04-24: 12.5 mg via ORAL
  Filled 2015-04-24: qty 1

## 2015-04-24 NOTE — Progress Notes (Signed)
Paged MD regarding 12 beat VTACH. Currently SB and asymptomatic. Will continue to monitor. Joaquin Bend E, RN 04/24/2015 12:46 AM

## 2015-04-24 NOTE — Progress Notes (Signed)
Ref: Birdie Riddle, MD   Subjective:  Remains afebrile over 48 hours. Had non-sustained VT. BNP slightly elevated but improved since admission.  Objective:  Vital Signs in the last 24 hours: Temp:  [98.1 F (36.7 C)-98.7 F (37.1 C)] 98.3 F (36.8 C) (01/13 0546) Pulse Rate:  [56-87] 56 (01/13 0546) Cardiac Rhythm:  [-] Other (Comment) (01/12 1900) Resp:  [16] 16 (01/13 0546) BP: (115-180)/(43-73) 149/73 mmHg (01/13 0546) SpO2:  [99 %-100 %] 100 % (01/13 0546) Weight:  [62.1 kg (136 lb 14.5 oz)] 62.1 kg (136 lb 14.5 oz) (01/13 0458)  Physical Exam: BP Readings from Last 1 Encounters:  04/24/15 149/73    Wt Readings from Last 1 Encounters:  04/24/15 62.1 kg (136 lb 14.5 oz)    Weight change: -3.4 kg (-7 lb 7.9 oz)  HEENT: [place]/AT, Eyes-Brown, PERL, Conjunctiva-Pink, Sclera-Non-icteric Neck: No JVD, No bruit, Trachea midline. Lungs:  Clearing, Bilateral. Transmitted noise from throat. Cardiac:  Regular rhythm, normal S1 and S2, no S3. III/VI systolic murmur  Abdomen:  Soft, non-tender. Extremities:  No edema present. No cyanosis. No clubbing. CNS: AxOx0, moves lower extremities randomly and with stimulus. Keeps elbows flexed with occasional movements with stimulus like bathing per nursing. Skin: Warm and dry.   Intake/Output from previous day: 01/12 0701 - 01/13 0700 In: 140 [NG/GT:140] Out: 400 [Urine:400]    Lab Results: BMET    Component Value Date/Time   NA 154* 04/23/2015 0626   NA 155* 04/22/2015 0630   NA 154* 04/20/2015 0720   NA 143 04/02/2015 1310   NA 143 02/04/2015 0810   NA 144 06/24/2014 1220   NA 141 07/06/2011 0855   K 3.2* 04/23/2015 0626   K 3.1* 04/22/2015 0630   K 3.5 04/20/2015 0720   K 2.7* 04/02/2015 1310   K 3.3* 02/04/2015 0810   K 3.9 06/24/2014 1220   K 3.6 07/06/2011 0855   CL 123* 04/23/2015 0626   CL 122* 04/22/2015 0630   CL 122* 04/20/2015 0720   CL 105 09/17/2012 0801   CL 107 08/08/2012 0755   CL 105 06/27/2012 1121   CL  104 07/06/2011 0855   CO2 26 04/23/2015 0626   CO2 25 04/22/2015 0630   CO2 24 04/20/2015 0720   CO2 33* 04/02/2015 1310   CO2 29 02/04/2015 0810   CO2 26 06/24/2014 1220   CO2 28 07/06/2011 0855   GLUCOSE 102* 04/23/2015 0626   GLUCOSE 132* 04/22/2015 0630   GLUCOSE 79 04/20/2015 0720   GLUCOSE 101 04/02/2015 1310   GLUCOSE 130 02/04/2015 0810   GLUCOSE 138 06/24/2014 1220   GLUCOSE 207* 09/17/2012 0801   GLUCOSE 202* 08/08/2012 0755   GLUCOSE 127* 06/27/2012 1121   GLUCOSE 90 07/06/2011 0855   BUN 12 04/23/2015 0626   BUN 13 04/22/2015 0630   BUN 17 04/20/2015 0720   BUN 36.9* 04/02/2015 1310   BUN 22.3 02/04/2015 0810   BUN 17.4 06/24/2014 1220   BUN 18 07/06/2011 0855   CREATININE 0.90 04/23/2015 0626   CREATININE 0.97 04/22/2015 0630   CREATININE 1.04 04/20/2015 0720   CREATININE 1.5* 04/02/2015 1310   CREATININE 1.5* 02/04/2015 0810   CREATININE 1.3 06/24/2014 1220   CREATININE 1.6* 07/06/2011 0855   CALCIUM 8.3* 04/23/2015 0626   CALCIUM 8.1* 04/22/2015 0630   CALCIUM 8.2* 04/20/2015 0720   CALCIUM 9.0 04/02/2015 1310   CALCIUM 9.1 02/04/2015 0810   CALCIUM 9.0 06/24/2014 1220   CALCIUM 8.8 07/06/2011 0855  GFRNONAA >60 04/23/2015 0626   GFRNONAA >60 04/22/2015 0630   GFRNONAA >60 04/20/2015 0720   GFRAA >60 04/23/2015 0626   GFRAA >60 04/22/2015 0630   GFRAA >60 04/20/2015 0720   CBC    Component Value Date/Time   WBC 9.0 04/20/2015 0720   WBC 3.8* 04/02/2015 1310   RBC 3.58* 04/20/2015 0720   RBC 3.84* 04/02/2015 1310   HGB 10.1* 04/20/2015 0720   HGB 11.1* 04/02/2015 1310   HCT 30.7* 04/20/2015 0720   HCT 33.9* 04/02/2015 1310   PLT 121* 04/20/2015 0720   PLT 84* 04/02/2015 1310   MCV 85.8 04/20/2015 0720   MCV 88.3 04/02/2015 1310   MCH 28.2 04/20/2015 0720   MCH 28.9 04/02/2015 1310   MCHC 32.9 04/20/2015 0720   MCHC 32.7 04/02/2015 1310   RDW 15.1 04/20/2015 0720   RDW 14.5 04/02/2015 1310   LYMPHSABS 0.4* 04/12/2015 1157   LYMPHSABS  1.3 04/02/2015 1310   MONOABS 0.4 04/12/2015 1157   MONOABS 0.5 04/02/2015 1310   EOSABS 0.0 04/12/2015 1157   EOSABS 0.1 04/02/2015 1310   BASOSABS 0.0 04/12/2015 1157   BASOSABS 0.0 04/02/2015 1310   HEPATIC Function Panel  Recent Labs  02/04/15 0810 03/10/15 1818 04/02/15 1310  PROT 7.0 6.3* 7.2   HEMOGLOBIN A1C No components found for: HGA1C,  MPG CARDIAC ENZYMES Lab Results  Component Value Date   TROPONINI <0.03 04/12/2015   TROPONINI 0.03 03/11/2015   TROPONINI 0.03 03/10/2015   BNP No results for input(s): PROBNP in the last 8760 hours. TSH  Recent Labs  01/16/15 1611  TSH 1.415   CHOLESTEROL No results for input(s): CHOL in the last 8760 hours.  Scheduled Meds: . albuterol  2.5 mg Nebulization BID  . amiodarone  200 mg Oral Daily  . antiseptic oral rinse  7 mL Mouth Rinse q12n4p  . chlorhexidine  15 mL Mouth Rinse BID  . enoxaparin (LOVENOX) injection  40 mg Subcutaneous Q24H  . feeding supplement (JEVITY 1.2 CAL)  1,000 mL Per Tube Q24H  . free water  60 mL Per Tube Q3H while awake  . insulin aspart  0-9 Units Subcutaneous 6 times per day  . levofloxacin  500 mg Oral Daily  . metoprolol tartrate  25 mg Oral BID  . phosphorus  500 mg Oral QID   Continuous Infusions:  PRN Meds:.acetaminophen  Assessment/Plan: Aspiration pneumonia-improving Hypoglycemic encephalopathy Status post non-sustained VT Hypertension Diabetes mellitus, II History of CVA Chronic left heart systolic failure CAD Severe protein calorie malnutrition Hypokalemia Hypernatremia  Change IV antibiotics to PO. Increase enteral feeding cautiously. Consider SNF.     LOS: 12 days    [name]  MD  04/24/2015, 8:06 AM

## 2015-04-24 NOTE — Progress Notes (Signed)
Discussed care with son Shane [name]. He and patient's wife accepts comfort care and discontinue tube feedings.

## 2015-04-24 NOTE — Progress Notes (Signed)
Nasal tracheal suctioned copious amounts of thick yellow secretions from patient lungs via left nostril.

## 2015-04-25 LAB — BASIC METABOLIC PANEL
Anion gap: 8 (ref 5–15)
BUN: 11 mg/dL (ref 6–20)
CALCIUM: 8.3 mg/dL — AB (ref 8.9–10.3)
CHLORIDE: 116 mmol/L — AB (ref 101–111)
CO2: 26 mmol/L (ref 22–32)
CREATININE: 0.99 mg/dL (ref 0.61–1.24)
Glucose, Bld: 156 mg/dL — ABNORMAL HIGH (ref 65–99)
Potassium: 2.9 mmol/L — ABNORMAL LOW (ref 3.5–5.1)
SODIUM: 150 mmol/L — AB (ref 135–145)

## 2015-04-25 LAB — GLUCOSE, CAPILLARY
GLUCOSE-CAPILLARY: 134 mg/dL — AB (ref 65–99)
GLUCOSE-CAPILLARY: 64 mg/dL — AB (ref 65–99)

## 2015-04-25 LAB — TSH: TSH: 3.939 u[IU]/mL (ref 0.350–4.500)

## 2015-04-25 MED ORDER — DEXTROSE 50 % IV SOLN
50.0000 mL | Freq: Once | INTRAVENOUS | Status: AC
  Administered 2015-04-25: 50 mL via INTRAVENOUS

## 2015-04-25 MED ORDER — DEXTROSE 50 % IV SOLN
INTRAVENOUS | Status: AC
Start: 2015-04-25 — End: 2015-04-25
  Filled 2015-04-25: qty 50

## 2015-04-25 MED ORDER — MORPHINE SULFATE 25 MG/ML IV SOLN
1.0000 mg/h | INTRAVENOUS | Status: DC
  Administered 2015-04-25: 1 mg/h via INTRAVENOUS
  Filled 2015-04-25: qty 10

## 2015-04-25 NOTE — Progress Notes (Signed)
250 mg/ml of IV morphine wasted with Jabier Mutton RN.

## 2015-04-25 NOTE — Progress Notes (Signed)
[facility] Liaison Note:  Received request from [place], [place] to evaluate for placement at [facility].  Spoke with son, [name] to confirm interest and set-up a time to meet to complete paperwork.  Son has still not responded with a time to meet to finalize arrangements.  Reviewed records with [facility] director, Dr. Pleas Koch, per family request and eligibility  had been confirmed-pending completion of paperwork.  Will attempt again to meet with family in AM to complete paperwork for transfer to [facility].  Please call with any questions.  Thank You, Freddi Starr RN, [facility] Liaison [phone]

## 2015-04-25 NOTE — Progress Notes (Signed)
Hypoglycemic Event  CBG: 64  Treatment: D50 IV 50 mL  Symptoms: None  Follow-up CBG: Time:0355 CBG Result:134  Possible Reasons for Event: Inadequate meal intake  Comments/MD notified:yes    [place]ville Silos, Cadon Raczka E

## 2015-04-25 NOTE — Clinical Social Work Note (Signed)
Clinical Social Work Assessment  Patient Details  Name: Shane [name] MRN: [id number] Date of Birth: [birth year]  Date of referral:  04/25/15               Reason for consult:  Facility Placement, Discharge Planning                Permission sought to share information with:  Family Supports, Customer service manager, Case Optician, dispensing granted to share information::  Yes, Verbal Permission Granted  Name::      Shane [name] )  Agency::   (Residential Hospice )  Relationship::   (Son)  Contact Information:   [phone])  Housing/Transportation Living arrangements for the past 2 months:  [place] of Information:  Patient Patient Interpreter Needed:  None Criminal Activity/Legal Involvement Pertinent to Current Situation/Hospitalization:  No - Comment as needed Significant Relationships:  None Lives with:  Facility Resident Do you feel safe going back to the place where you live?  No Need for family participation in patient care:  No (Coment)  Care giving concerns:  Residential Hospice recommended.    Social Worker assessment / plan:  Holiday representative spoke with pt's son, Shane [name] in reference to post acute placement for residential hospice. Pt's son agreeable to [facility]. CSW has made referral. Family familiar with [facility] process. No further concerns reported at this time. CSW will continue to follow pt and pt's family for continued support and to facilitate pt's discharge needs once stable for transfer.   Employment status:  Retired Forensic scientist:  Medicare PT Recommendations:   ([place] ) Information / Referral to community resources:  [place]  Patient/Family's Response to care:  Pt's son agreeable to [facility]. Pt's son pleasant and appreciated social work intervention.   Patient/Family's Understanding of and Emotional Response to Diagnosis, Current Treatment, and Prognosis:  Pt's son  knowledgeable of prognosis.   Emotional Assessment Appearance:  Appears stated age Attitude/Demeanor/Rapport:  Unable to Assess Affect (typically observed):  Unable to Assess Orientation:  Oriented to Situation, Oriented to  Time, Oriented to Place, Oriented to Self Alcohol / Substance use:  Not Applicable Psych involvement (Current and /or in the community):  No (Comment)  Discharge Needs  Concerns to be addressed:  Care Coordination Readmission within the last 30 days:  No Current discharge risk:  Dependent with Mobility Barriers to Discharge:  Continued Medical Work up   [employer], MSW, LCSWA [phone] 04/25/2015 1:10 PM

## 2015-04-25 NOTE — Progress Notes (Signed)
Ref: Birdie Riddle, MD   Subjective:  Resting comfortably. T max 99.3 F  Objective:  Vital Signs in the last 24 hours: Temp:  [97.5 F (36.4 C)-99.3 F (37.4 C)] 99.3 F (37.4 C) (01/14 1806) Pulse Rate:  [59-103] 70 (01/14 1806) Cardiac Rhythm:  [-]  Resp:  [14-16] 16 (01/14 1806) BP: (129-165)/(50-65) 165/51 mmHg (01/14 1806) SpO2:  [92 %-100 %] 100 % (01/14 1806)  Physical Exam: BP Readings from Last 1 Encounters:  04/25/15 165/51    Wt Readings from Last 1 Encounters:  04/24/15 62.1 kg (136 lb 14.5 oz)    Weight change:   HEENT: [place]/AT, Eyes-Brown, Conjunctiva-Pink, Sclera-Non-icteric Neck: No JVD, No bruit, Trachea midline. Lungs:  Clearing, Bilateral. Cardiac:  Regular rhythm, normal S1 and S2, no S3. III/VI systolic murmur. Abdomen:  Soft, non-tender. Extremities:  No edema present. No cyanosis. No clubbing. CNS: AxOx0,  Skin: Warm and dry.   Intake/Output from previous day: 01/13 0701 - 01/14 0700 In: 80 [NG/GT:80] Out: 700 [Urine:700]    Lab Results: BMET    Component Value Date/Time   NA 150* 04/25/2015 0411   NA 154* 04/23/2015 0626   NA 155* 04/22/2015 0630   NA 143 04/02/2015 1310   NA 143 02/04/2015 0810   NA 144 06/24/2014 1220   NA 141 07/06/2011 0855   K 2.9* 04/25/2015 0411   K 3.2* 04/23/2015 0626   K 3.1* 04/22/2015 0630   K 2.7* 04/02/2015 1310   K 3.3* 02/04/2015 0810   K 3.9 06/24/2014 1220   K 3.6 07/06/2011 0855   CL 116* 04/25/2015 0411   CL 123* 04/23/2015 0626   CL 122* 04/22/2015 0630   CL 105 09/17/2012 0801   CL 107 08/08/2012 0755   CL 105 06/27/2012 1121   CL 104 07/06/2011 0855   CO2 26 04/25/2015 0411   CO2 26 04/23/2015 0626   CO2 25 04/22/2015 0630   CO2 33* 04/02/2015 1310   CO2 29 02/04/2015 0810   CO2 26 06/24/2014 1220   CO2 28 07/06/2011 0855   GLUCOSE 156* 04/25/2015 0411   GLUCOSE 102* 04/23/2015 0626   GLUCOSE 132* 04/22/2015 0630   GLUCOSE 101 04/02/2015 1310   GLUCOSE 130 02/04/2015 0810   GLUCOSE 138 06/24/2014 1220   GLUCOSE 207* 09/17/2012 0801   GLUCOSE 202* 08/08/2012 0755   GLUCOSE 127* 06/27/2012 1121   GLUCOSE 90 07/06/2011 0855   BUN 11 04/25/2015 0411   BUN 12 04/23/2015 0626   BUN 13 04/22/2015 0630   BUN 36.9* 04/02/2015 1310   BUN 22.3 02/04/2015 0810   BUN 17.4 06/24/2014 1220   BUN 18 07/06/2011 0855   CREATININE 0.99 04/25/2015 0411   CREATININE 0.90 04/23/2015 0626   CREATININE 0.97 04/22/2015 0630   CREATININE 1.5* 04/02/2015 1310   CREATININE 1.5* 02/04/2015 0810   CREATININE 1.3 06/24/2014 1220   CREATININE 1.6* 07/06/2011 0855   CALCIUM 8.3* 04/25/2015 0411   CALCIUM 8.3* 04/23/2015 0626   CALCIUM 8.1* 04/22/2015 0630   CALCIUM 9.0 04/02/2015 1310   CALCIUM 9.1 02/04/2015 0810   CALCIUM 9.0 06/24/2014 1220   CALCIUM 8.8 07/06/2011 0855   GFRNONAA >60 04/25/2015 0411   GFRNONAA >60 04/23/2015 0626   GFRNONAA >60 04/22/2015 0630   GFRAA >60 04/25/2015 0411   GFRAA >60 04/23/2015 0626   GFRAA >60 04/22/2015 0630   CBC    Component Value Date/Time   WBC 9.0 04/20/2015 0720   WBC 3.8* 04/02/2015 1310   RBC  3.58* 04/20/2015 0720   RBC 3.84* 04/02/2015 1310   HGB 10.1* 04/20/2015 0720   HGB 11.1* 04/02/2015 1310   HCT 30.7* 04/20/2015 0720   HCT 33.9* 04/02/2015 1310   PLT 121* 04/20/2015 0720   PLT 84* 04/02/2015 1310   MCV 85.8 04/20/2015 0720   MCV 88.3 04/02/2015 1310   MCH 28.2 04/20/2015 0720   MCH 28.9 04/02/2015 1310   MCHC 32.9 04/20/2015 0720   MCHC 32.7 04/02/2015 1310   RDW 15.1 04/20/2015 0720   RDW 14.5 04/02/2015 1310   LYMPHSABS 0.4* 04/12/2015 1157   LYMPHSABS 1.3 04/02/2015 1310   MONOABS 0.4 04/12/2015 1157   MONOABS 0.5 04/02/2015 1310   EOSABS 0.0 04/12/2015 1157   EOSABS 0.1 04/02/2015 1310   BASOSABS 0.0 04/12/2015 1157   BASOSABS 0.0 04/02/2015 1310   HEPATIC Function Panel  Recent Labs  02/04/15 0810 03/10/15 1818 04/02/15 1310  PROT 7.0 6.3* 7.2   HEMOGLOBIN A1C No components found for:  HGA1C,  MPG CARDIAC ENZYMES Lab Results  Component Value Date   TROPONINI <0.03 04/12/2015   TROPONINI 0.03 03/11/2015   TROPONINI 0.03 03/10/2015   BNP No results for input(s): PROBNP in the last 8760 hours. TSH  Recent Labs  01/16/15 1611 04/25/15 0411  TSH 1.415 3.939   CHOLESTEROL No results for input(s): CHOL in the last 8760 hours.  Scheduled Meds: . antiseptic oral rinse  7 mL Mouth Rinse q12n4p  . chlorhexidine  15 mL Mouth Rinse BID  . free water  60 mL Per Tube Q3H while awake  . phosphorus  500 mg Oral QID   Continuous Infusions: . morphine 1 mg/hr (04/25/15 1011)   PRN Meds:.acetaminophen  Assessment/Plan: Aspiration pneumonia-improving Hypoglycemic encephalopathy Status post non-sustained VT Hypertension Diabetes mellitus, II History of CVA Chronic left heart systolic failure CAD Severe protein calorie malnutrition Hypokalemia Hypernatremia  Continue comfort care. Transfer to [facility] tomorrow if ready.     LOS: 13 days    [name]  MD  04/25/2015, 9:04 PM

## 2015-04-25 NOTE — Clinical Social Work Note (Signed)
CSW consult acknowledged:  Clinical Social Worker received consult for residential hospice placement. CSW has made referral to [facility].   CSW has also attempted to contact family to complete psychosocial assessment. CSW remains available as needed.   Glendon Axe, MSW, LCSWA [phone] 04/25/2015 10:55 AM

## 2015-04-25 NOTE — Clinical Social Work Note (Signed)
[facility] [place] [facility] has approved/accepted patient for admissions tomorrow morning, 1/15.   Family aware and will complete paperwork with [facility] admissions coordinator.  CSW remains available as needed.  Glendon Axe, MSW, LCSWA [phone] 04/25/2015 3:43 PM

## 2015-04-26 NOTE — Progress Notes (Signed)
[place] Liaison:  Completed paperwork with son, [name] for admission to [facility] today. ? Please fax discharge summary to [phone]. ? RN please call report to [phone]. ? Please arrange transport for as soon as possible.  ? Thank you, Freddi Starr RN, [facility] Liaison [phone]

## 2015-04-26 NOTE — Discharge Summary (Signed)
Physician Discharge Summary  Patient ID: Shane [name] MRN: [id number] DOB/AGE: October 27, 1926 82 y.o.  Admit date: 04/12/2015 Discharge date: 04/26/2015  Admission Diagnoses: Aspiration pneumonia-improving Hypoglycemic encephalopathy Status post non-sustained VT Hypertension Diabetes mellitus, II History of CVA Chronic left heart systolic failure CAD Severe protein calorie malnutrition Hypokalemia Hypernatremia  Discharge Diagnoses:  Principle Problem: *Hypoglycemic encephalopathy*   Active problem:   Aspiration pneumonia-improving   Status post non-sustained VT   Hypertension   Diabetes mellitus, II   History of CVA   Chronic left heart systolic failure   CAD   Severe protein calorie malnutrition   Hypokalemia   Hypernatremia  Discharged Condition: poor  Hospital Course: 82 year old male brought in by EMS. He was found by family on the morning of admission as poorly respsnsive. Last seen sometime around 7 PM day before admission in his usual state of health. On scene his glucose was 32 mg/dL. He was given dextrose without significant improvement of his mental status despite improved blood sugar. He had neurology work up and neurology consult. He had minimal improvement over his poor responsiveness over 10 days. He suffered aspiration pneumonia treated with IV antibiotics. On 04/23/2014 he was made DNR and comfort care per family request. He will be transferred to [facility] care as early as today.   Consults: neurology  Significant Diagnostic Studies: labs: Near normal CBC with mild anemia and thrombocytopenia. BNP was mildly elevated but improved form a month ago. TSH was normal. BMET showed mild and chronic hypokalemia, mildly elevated glucose levels and improving BUN/Cr level.  EKG-sinus tachycardia with frequent APCs and VPCs, old septal infarct and LVH.  Chest x-ray showed possible aspiration pneumonia.  Subsequent Chest x-ray- mild pulmonary edema and right  lower lobe pneumonia  CT head : No acute finding with cerebral atrophy.  MRI brain: 1. No acute intracranial process identified.  2. Scattered small remote lacunar infarcts involving the right thalamus and bilateral cerebellar hemispheres, right greater left. 3. Advanced cerebral atrophy with mild chronic small vessel ischemic disease.  Treatments: antibiotics: vancomycin, Zosyn, Levaquin and azithromycin  Discharge Exam: Blood pressure 145/57, pulse 67, temperature 97.6 F (36.4 C), temperature source Axillary, resp. rate 16, height 6' (1.829 m), weight 62.1 kg (136 lb 14.5 oz), SpO2 96 %. HEENT: /AT, Eyes-Brown, Conjunctiva-Pink, Sclera-Non-icteric. Feeding tube through nose. Neck: No JVD, No bruit, Trachea midline. Lungs: Clearing post throat or deep suction, Bilateral. Cardiac: irregular rhythm, normal S1 and S2, no S3. III/VI systolic murmur. Abdomen: Soft, non-tender. Extremities: No edema present. No cyanosis. No clubbing. CNS: AxOx0. Occasional toes and foot motion without stimulus Skin: Warm and dry.  Disposition: 03-SNF/[facility]     Medication List    STOP taking these medications        alendronate 70 MG tablet  Commonly known as:  FOSAMAX     aspirin EC 81 MG tablet     atorvastatin 20 MG tablet  Commonly known as:  LIPITOR     cholecalciferol 1000 units tablet  Commonly known as:  VITAMIN D     [employer] SENNA PLUS 8.6-50 MG tablet  Generic drug:  senna-docusate     erythromycin ophthalmic ointment     furosemide 40 MG tablet  Commonly known as:  LASIX     gemfibrozil 600 MG tablet  Commonly known as:  LOPID     glipiZIDE 2.5 MG 24 hr tablet  Commonly known as:  GLUCOTROL XL     HYDROcodone-acetaminophen 5-325 MG tablet  Commonly known as:  NORCO     HYZAAR 100-25 MG tablet  Generic drug:  losartan-hydrochlorothiazide     Lactulose 20 GM/30ML Soln     metoCLOPramide 5 MG tablet  Commonly known as:  REGLAN     omeprazole 20 MG capsule   Commonly known as:  PRILOSEC     potassium chloride 10 MEQ tablet  Commonly known as:  K-DUR     predniSONE 5 MG tablet  Commonly known as:  DELTASONE     vitamin C 500 MG tablet  Commonly known as:  ASCORBIC ACID     ZYTIGA 250 MG tablet  Generic drug:  abiraterone Acetate         Signed: Brynlea Spindler S 04/26/2015, 9:59 AM

## 2015-04-26 NOTE — Progress Notes (Signed)
200 mg IV morphine wasted  In sink with Particia Nearing RN.

## 2015-04-26 NOTE — Progress Notes (Signed)
Per [place], family to meet with Hospice SW today around lunch time.    Bernita Raisin, [place] [facility] [phone]

## 2015-04-26 NOTE — Progress Notes (Signed)
Pt d/c to [facility]. Report called to [employer]. Assessment stable.

## 2015-05-05 ENCOUNTER — Other Ambulatory Visit: Payer: Medicare Other

## 2015-05-05 ENCOUNTER — Encounter (HOSPITAL_COMMUNITY): Payer: Medicare Other

## 2015-05-05 ENCOUNTER — Ambulatory Visit (HOSPITAL_COMMUNITY): Payer: Medicare Other

## 2015-05-06 ENCOUNTER — Ambulatory Visit: Payer: Medicare Other | Admitting: Oncology

## 2015-05-13 DEATH — deceased

## 2015-05-14 ENCOUNTER — Ambulatory Visit: Payer: Medicare Other | Admitting: Adult Health

## 2015-07-18 IMAGING — DX DG CHEST 1V PORT
1 series · 1 of 1 positions shown · non-contrast
Comparison: 04/01/2011

CLINICAL DATA: Preop

EXAM:
PORTABLE CHEST - 1 VIEW

[AP]
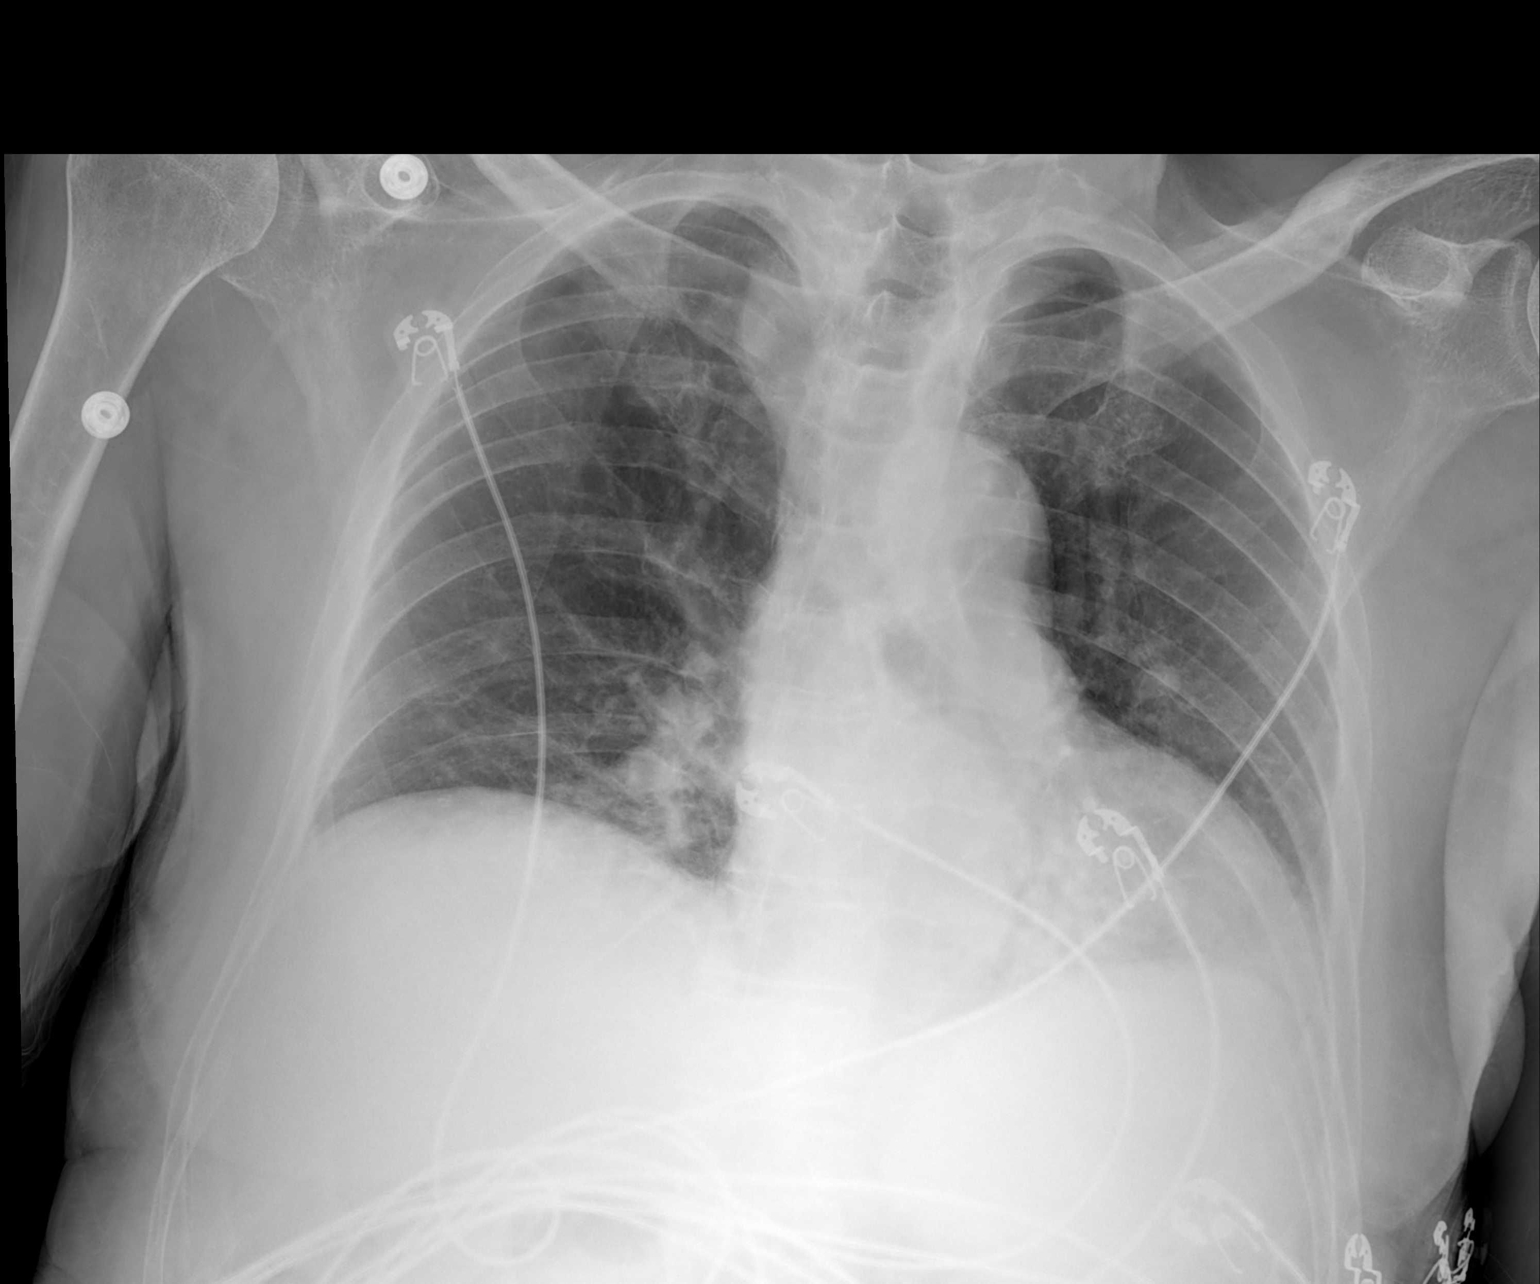

[1 of 1 positions shown; findings below may reference images not displayed]

FINDINGS: Lungs are very under aerated with bibasilar atelectasis. Normal
heart size. Left midlung nodular density has developed measuring 14
mm. No pneumothorax.
IMPRESSION: New left midlung nodular density. Upright PA and lateral chest
radiographs are recommended.

Bibasilar atelectasis.

## 2015-07-18 IMAGING — CT CT ABD-PELV W/ CM
2 of 5 series · 16 of 46 positions shown, 18 images · IV contrast (OMNIPAQUE 300)
Comparison: 07/06/2011 CT

ADDENDUM:
Critical Value/emergent results were called by telephone at the time
of interpretation on 08/24/2014 at [DATE] to Dr. JENIELYN LETRAN ,
who verbally acknowledged these results.
CLINICAL DATA: 87-year-old male with bilateral lower abdominal and
pelvic pain. History of prostate cancer, appendectomy and inguinal
hernia repair.

EXAM:
CT ABDOMEN AND PELVIS WITH CONTRAST
TECHNIQUE: Multidetector CT imaging of the abdomen and pelvis was performed
using the standard protocol following bolus administration of
intravenous contrast.
CONTRAST:  80mL OMNIPAQUE IOHEXOL 300 MG/ML  SOLN

[Series 2: abd/pel with · axial · 0.81mm/px · z∈[+71,+466]mm · 13 of 89 slices shown, 15 images]
[im 5/89  soft-tissue]
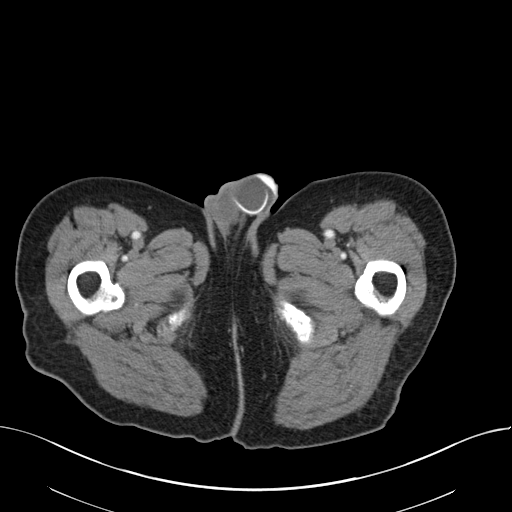
[im 5/89  bone]
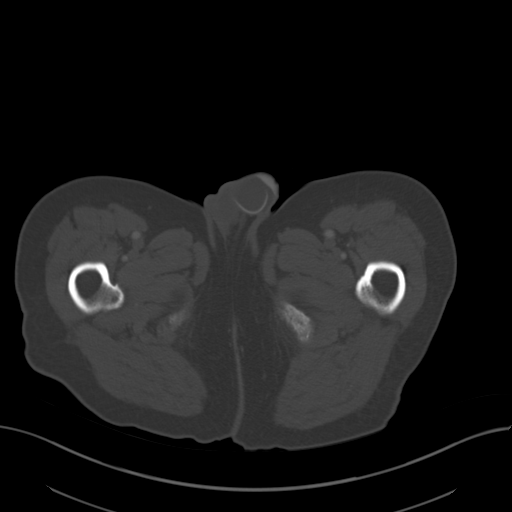
[im 13/89  soft-tissue]
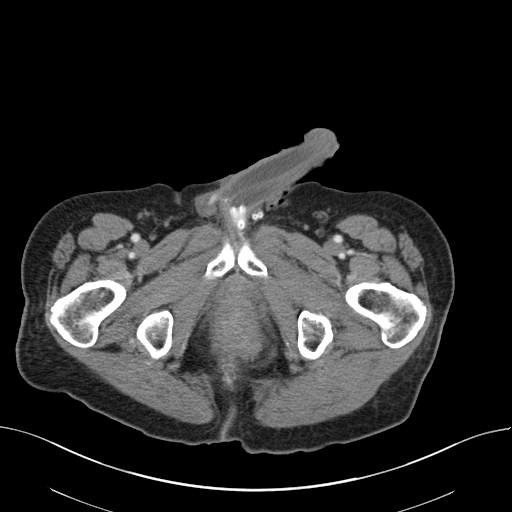
[im 17/89  soft-tissue]
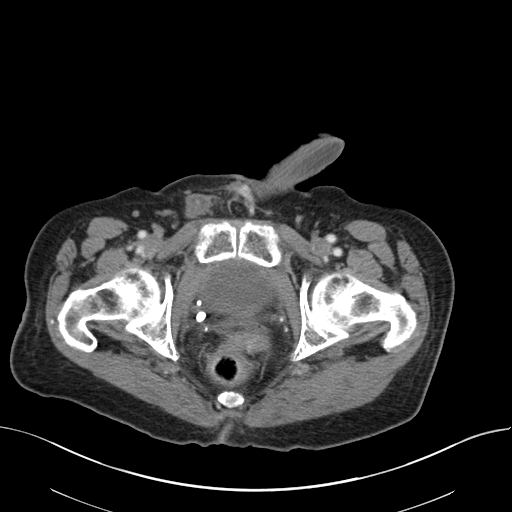
[im 26/89  soft-tissue]
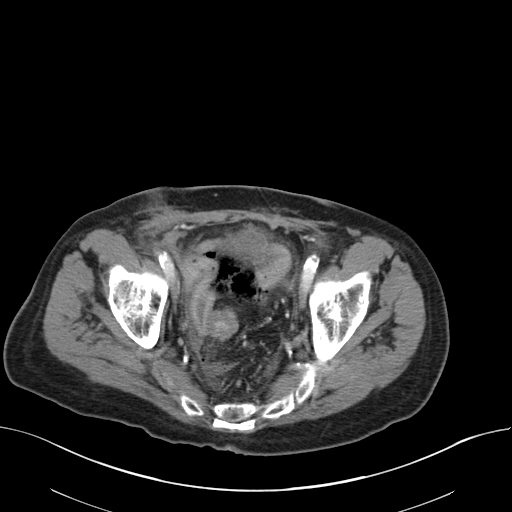
[im 30/89  soft-tissue]
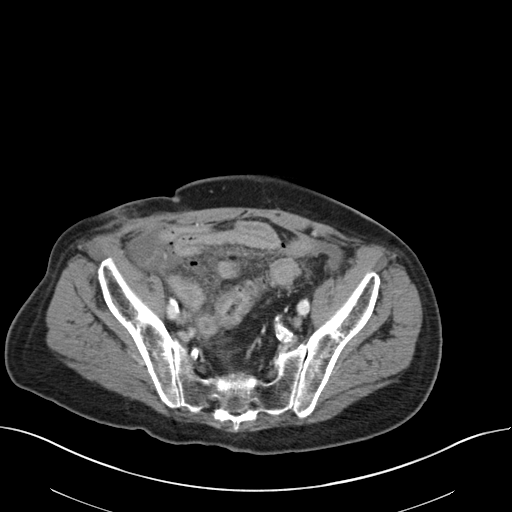
[im 38/89  soft-tissue]
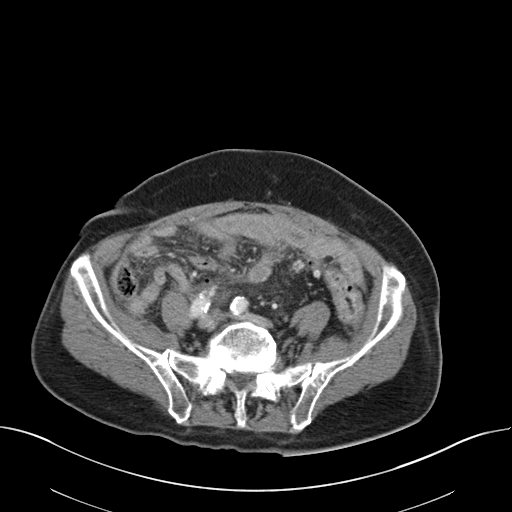
[im 47/89  soft-tissue]
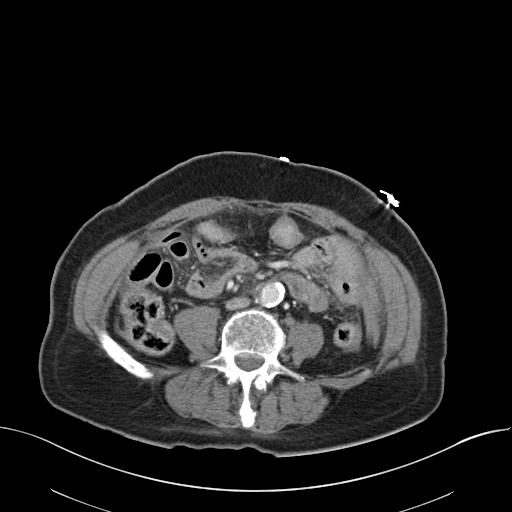
[im 51/89  soft-tissue]
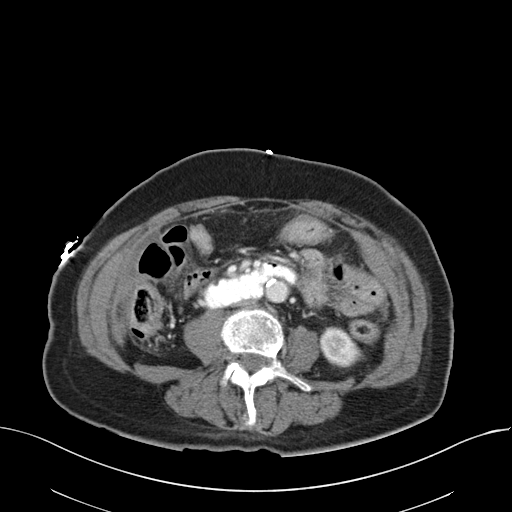
[im 59/89  soft-tissue]
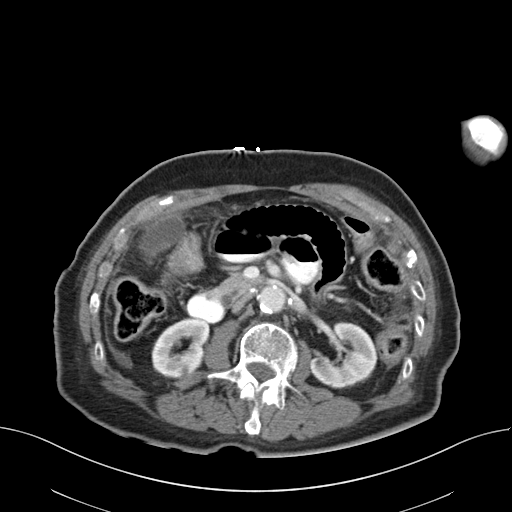
[im 59/89  bone]
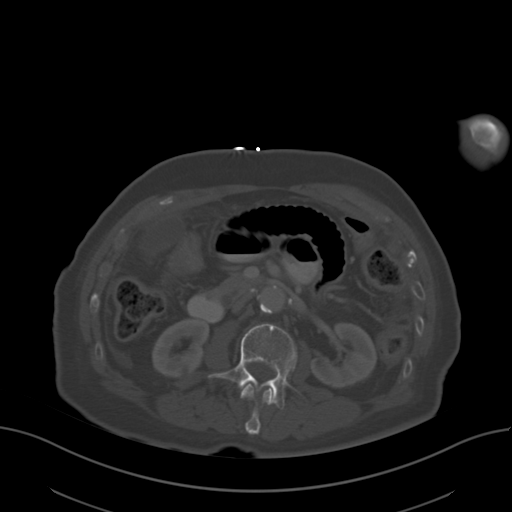
[im 63/89  soft-tissue]
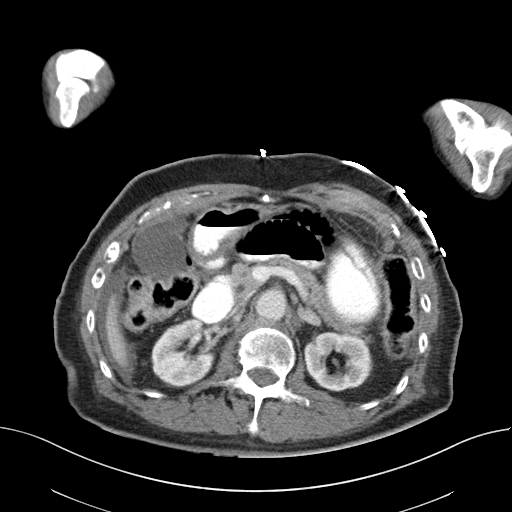
[im 72/89  soft-tissue]
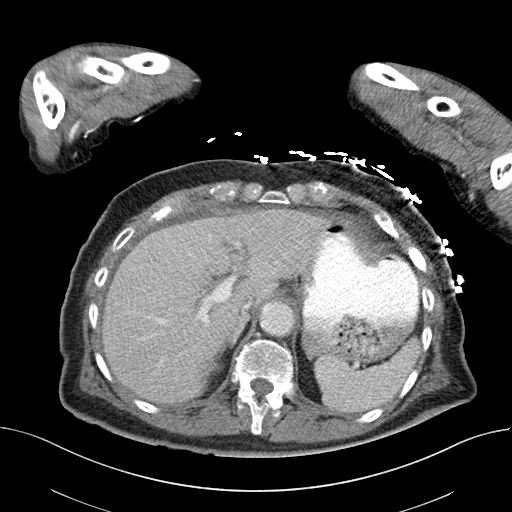
[im 76/89  soft-tissue]
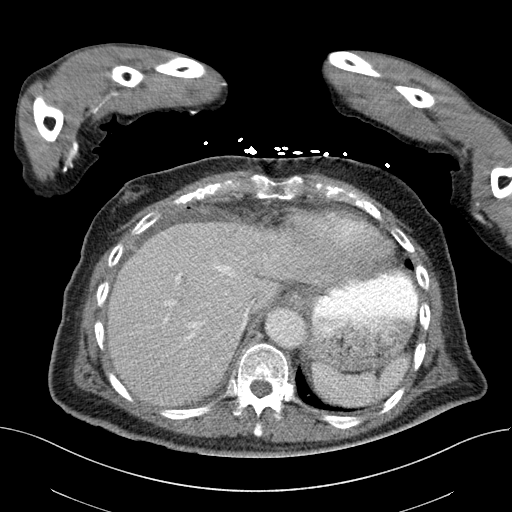
[im 84/89  soft-tissue]
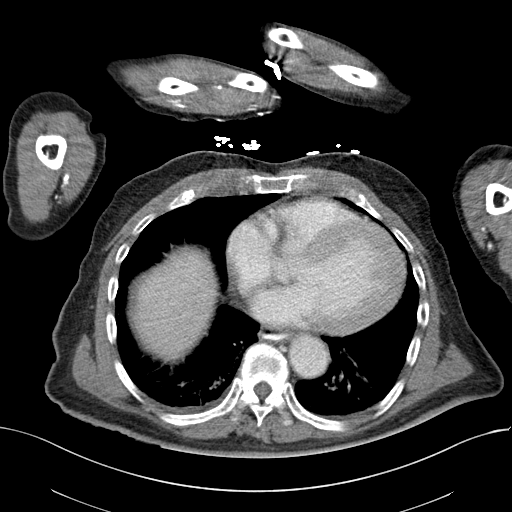

[Series 4: coronal a/|p · coronal · 0.74mm/px · 3 of 108 slices shown]
[im 36/108  soft-tissue]
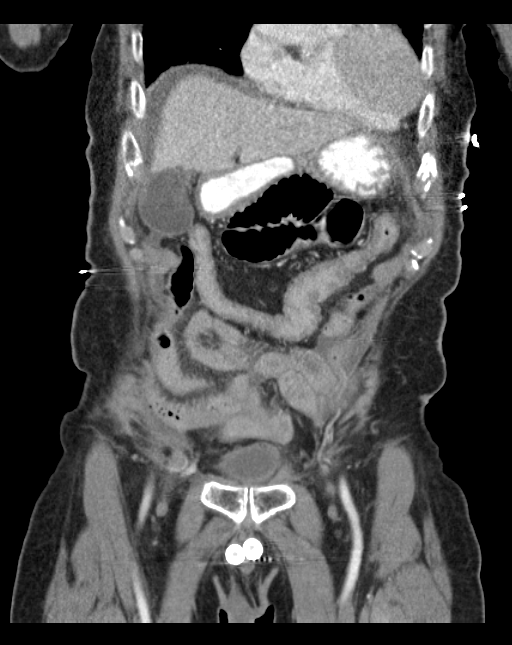
[im 48/108  soft-tissue]
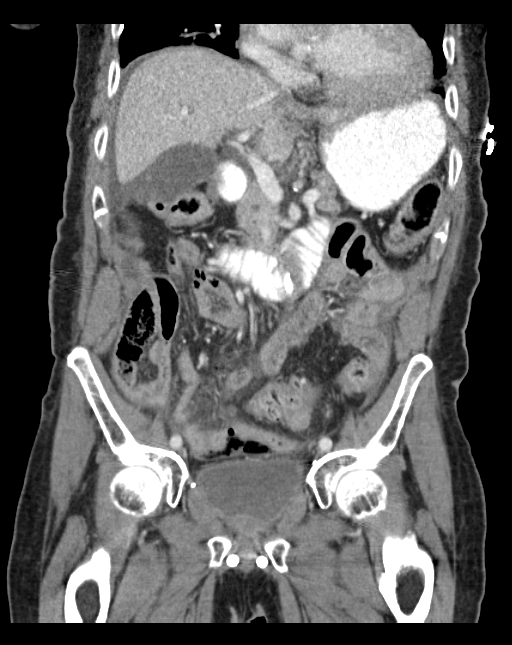
[im 60/108  soft-tissue]
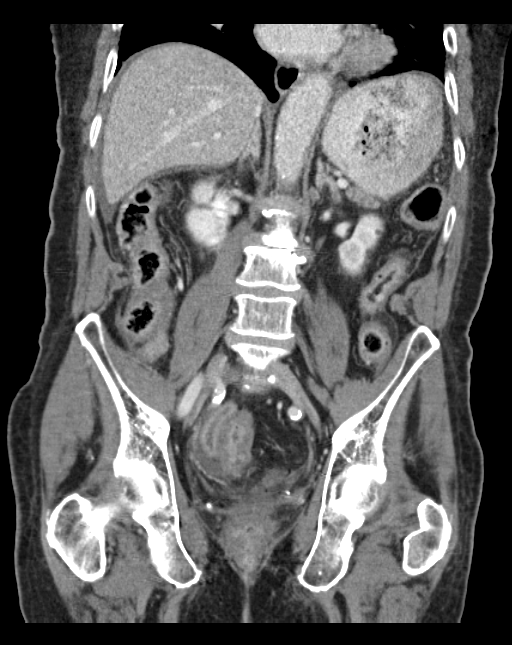

[16 of 46 positions shown; findings below may reference images not displayed]

FINDINGS: Lower chest:  Mild bibasilar atelectasis identified.

Hepatobiliary: The liver and gallbladder are unremarkable. There is
no evidence of biliary dilatation.

Pancreas: Unremarkable

Spleen: Unremarkable

Adrenals/Urinary Tract: Moderate renal cortical atrophy noted. A 12
mm nonobstructing calculus within the left lower kidney and right
lower pole renal cyst noted. There is no evidence of hydronephrosis.
The adrenal glands and bladder are unremarkable.

Stomach/Bowel: Pneumoperitoneum is identified, with the largest
focus of gas in the lower pelvis compatible with bowel perforation,
which appears to originate from the distal sigmoid colon. Diffuse
circumferential colonic and small bowel wall thickening is noted
loops which may represent colitis/enteritis or reactive. Small
amount of free fluid within the abdomen is noted. No discrete
abscess is noted.

Vascular/Lymphatic: No enlarged lymph nodes or abdominal aortic
aneurysm.

Reproductive: Prostatectomy changes and penile prosthesis noted.

Musculoskeletal: No acute or suspicious abnormalities. Multilevel
degenerative disc disease and spondylosis within the lumbar spine
identified.
IMPRESSION: Pneumoperitoneum compatible with bowel perforation. The site of
perforation appears to be in the distal sigmoid colon. Small amount
of ascites without abscess.

Diffuse colonic and small bowel wall thickening which may represent
colitis/enteritis vs reactive.
